# Patient Record
Sex: Female | Born: 1940 | Race: White | Hispanic: No | Smoking: Former smoker
Health system: Southern US, Community
[De-identification: ages and names within clinical notes are randomized; demographics above are authoritative.]

## PROBLEM LIST (undated history)

## (undated) DIAGNOSIS — T7840XA Allergy, unspecified, initial encounter: Secondary | ICD-10-CM

## (undated) DIAGNOSIS — F329 Major depressive disorder, single episode, unspecified: Secondary | ICD-10-CM

## (undated) DIAGNOSIS — K449 Diaphragmatic hernia without obstruction or gangrene: Secondary | ICD-10-CM

## (undated) DIAGNOSIS — I4891 Unspecified atrial fibrillation: Secondary | ICD-10-CM

## (undated) DIAGNOSIS — M1990 Unspecified osteoarthritis, unspecified site: Secondary | ICD-10-CM

## (undated) DIAGNOSIS — F32A Depression, unspecified: Secondary | ICD-10-CM

## (undated) DIAGNOSIS — J449 Chronic obstructive pulmonary disease, unspecified: Secondary | ICD-10-CM

## (undated) DIAGNOSIS — E039 Hypothyroidism, unspecified: Secondary | ICD-10-CM

## (undated) DIAGNOSIS — K3184 Gastroparesis: Secondary | ICD-10-CM

## (undated) DIAGNOSIS — R490 Dysphonia: Secondary | ICD-10-CM

## (undated) DIAGNOSIS — I10 Essential (primary) hypertension: Secondary | ICD-10-CM

## (undated) DIAGNOSIS — IMO0001 Reserved for inherently not codable concepts without codable children: Secondary | ICD-10-CM

## (undated) DIAGNOSIS — Z8719 Personal history of other diseases of the digestive system: Secondary | ICD-10-CM

## (undated) DIAGNOSIS — K219 Gastro-esophageal reflux disease without esophagitis: Secondary | ICD-10-CM

## (undated) DIAGNOSIS — K2270 Barrett's esophagus without dysplasia: Secondary | ICD-10-CM

## (undated) HISTORY — PX: CATARACT EXTRACTION: SUR2

## (undated) HISTORY — DX: Gastroparesis: K31.84

## (undated) HISTORY — DX: Barrett's esophagus without dysplasia: K22.70

## (undated) HISTORY — DX: Essential (primary) hypertension: I10

## (undated) HISTORY — PX: COLONOSCOPY: SHX174

## (undated) HISTORY — DX: Dysphonia: R49.0

## (undated) HISTORY — DX: Allergy, unspecified, initial encounter: T78.40XA

## (undated) HISTORY — DX: Hypothyroidism, unspecified: E03.9

## (undated) HISTORY — PX: KNEE SURGERY: SHX244

## (undated) HISTORY — DX: Depression, unspecified: F32.A

## (undated) HISTORY — DX: Major depressive disorder, single episode, unspecified: F32.9

## (undated) HISTORY — DX: Personal history of other diseases of the digestive system: Z87.19

## (undated) HISTORY — DX: Unspecified osteoarthritis, unspecified site: M19.90

## (undated) HISTORY — DX: Unspecified atrial fibrillation: I48.91

## (undated) HISTORY — PX: EYE SURGERY: SHX253

## (undated) HISTORY — DX: Diaphragmatic hernia without obstruction or gangrene: K44.9

## (undated) HISTORY — DX: Chronic obstructive pulmonary disease, unspecified: J44.9

## (undated) HISTORY — DX: Gastro-esophageal reflux disease without esophagitis: K21.9

## (undated) SURGERY — CARDIOVERSION
Anesthesia: Monitor Anesthesia Care

---

## 1968-12-09 HISTORY — PX: ABDOMINAL HYSTERECTOMY: SUR658

## 1998-05-11 ENCOUNTER — Ambulatory Visit (HOSPITAL_COMMUNITY): Admission: RE | Admit: 1998-05-11 | Discharge: 1998-05-11 | Payer: Self-pay | Admitting: Endocrinology

## 1998-12-09 HISTORY — PX: FACIAL COSMETIC SURGERY: SHX629

## 1999-01-30 ENCOUNTER — Other Ambulatory Visit: Admission: RE | Admit: 1999-01-30 | Discharge: 1999-01-30 | Payer: Self-pay | Admitting: Endocrinology

## 1999-06-18 ENCOUNTER — Encounter: Payer: Self-pay | Admitting: Endocrinology

## 1999-06-18 ENCOUNTER — Ambulatory Visit (HOSPITAL_COMMUNITY): Admission: RE | Admit: 1999-06-18 | Discharge: 1999-06-18 | Payer: Self-pay | Admitting: Endocrinology

## 2000-04-29 ENCOUNTER — Other Ambulatory Visit: Admission: RE | Admit: 2000-04-29 | Discharge: 2000-04-29 | Payer: Self-pay | Admitting: Endocrinology

## 2000-06-20 ENCOUNTER — Ambulatory Visit (HOSPITAL_COMMUNITY): Admission: RE | Admit: 2000-06-20 | Discharge: 2000-06-20 | Payer: Self-pay | Admitting: Endocrinology

## 2000-06-20 ENCOUNTER — Encounter: Payer: Self-pay | Admitting: Endocrinology

## 2001-06-22 ENCOUNTER — Other Ambulatory Visit: Admission: RE | Admit: 2001-06-22 | Discharge: 2001-06-22 | Payer: Self-pay | Admitting: Endocrinology

## 2001-07-06 ENCOUNTER — Ambulatory Visit (HOSPITAL_COMMUNITY): Admission: RE | Admit: 2001-07-06 | Discharge: 2001-07-06 | Payer: Self-pay | Admitting: Endocrinology

## 2001-07-06 ENCOUNTER — Encounter: Payer: Self-pay | Admitting: Endocrinology

## 2002-06-29 ENCOUNTER — Other Ambulatory Visit: Admission: RE | Admit: 2002-06-29 | Discharge: 2002-06-29 | Payer: Self-pay | Admitting: Endocrinology

## 2002-07-14 ENCOUNTER — Encounter: Payer: Self-pay | Admitting: Endocrinology

## 2002-07-14 ENCOUNTER — Ambulatory Visit (HOSPITAL_COMMUNITY): Admission: RE | Admit: 2002-07-14 | Discharge: 2002-07-14 | Payer: Self-pay | Admitting: Endocrinology

## 2003-06-28 ENCOUNTER — Other Ambulatory Visit: Admission: RE | Admit: 2003-06-28 | Discharge: 2003-06-28 | Payer: Self-pay | Admitting: Endocrinology

## 2003-07-18 ENCOUNTER — Encounter: Payer: Self-pay | Admitting: Endocrinology

## 2003-07-18 ENCOUNTER — Ambulatory Visit (HOSPITAL_COMMUNITY): Admission: RE | Admit: 2003-07-18 | Discharge: 2003-07-18 | Payer: Self-pay | Admitting: Endocrinology

## 2004-02-07 ENCOUNTER — Encounter: Admission: RE | Admit: 2004-02-07 | Discharge: 2004-02-07 | Payer: Self-pay | Admitting: Endocrinology

## 2004-08-16 ENCOUNTER — Ambulatory Visit (HOSPITAL_COMMUNITY): Admission: RE | Admit: 2004-08-16 | Discharge: 2004-08-16 | Payer: Self-pay | Admitting: Endocrinology

## 2005-08-13 ENCOUNTER — Other Ambulatory Visit: Admission: RE | Admit: 2005-08-13 | Discharge: 2005-08-13 | Payer: Self-pay | Admitting: Endocrinology

## 2005-08-19 ENCOUNTER — Ambulatory Visit (HOSPITAL_COMMUNITY): Admission: RE | Admit: 2005-08-19 | Discharge: 2005-08-19 | Payer: Self-pay | Admitting: Endocrinology

## 2005-09-20 ENCOUNTER — Ambulatory Visit (HOSPITAL_COMMUNITY): Admission: RE | Admit: 2005-09-20 | Discharge: 2005-09-20 | Payer: Self-pay

## 2005-09-23 ENCOUNTER — Encounter (INDEPENDENT_AMBULATORY_CARE_PROVIDER_SITE_OTHER): Payer: Self-pay | Admitting: *Deleted

## 2005-09-23 ENCOUNTER — Ambulatory Visit (HOSPITAL_COMMUNITY): Admission: RE | Admit: 2005-09-23 | Discharge: 2005-09-23 | Payer: Self-pay | Admitting: *Deleted

## 2005-10-28 ENCOUNTER — Ambulatory Visit (HOSPITAL_COMMUNITY): Admission: RE | Admit: 2005-10-28 | Discharge: 2005-10-28 | Payer: Self-pay | Admitting: Specialist

## 2005-11-27 ENCOUNTER — Ambulatory Visit (HOSPITAL_COMMUNITY): Admission: RE | Admit: 2005-11-27 | Discharge: 2005-11-27 | Payer: Self-pay | Admitting: Specialist

## 2006-02-06 ENCOUNTER — Encounter (INDEPENDENT_AMBULATORY_CARE_PROVIDER_SITE_OTHER): Payer: Self-pay | Admitting: Cardiology

## 2006-02-06 ENCOUNTER — Inpatient Hospital Stay (HOSPITAL_COMMUNITY): Admission: EM | Admit: 2006-02-06 | Discharge: 2006-02-09 | Payer: Self-pay | Admitting: *Deleted

## 2006-08-21 ENCOUNTER — Ambulatory Visit (HOSPITAL_COMMUNITY): Admission: RE | Admit: 2006-08-21 | Discharge: 2006-08-21 | Payer: Self-pay | Admitting: Endocrinology

## 2006-10-13 ENCOUNTER — Other Ambulatory Visit: Admission: RE | Admit: 2006-10-13 | Discharge: 2006-10-13 | Payer: Self-pay | Admitting: Endocrinology

## 2006-11-13 ENCOUNTER — Encounter: Admission: RE | Admit: 2006-11-13 | Discharge: 2006-11-13 | Payer: Self-pay | Admitting: *Deleted

## 2006-11-13 ENCOUNTER — Encounter (INDEPENDENT_AMBULATORY_CARE_PROVIDER_SITE_OTHER): Payer: Self-pay | Admitting: *Deleted

## 2006-12-08 ENCOUNTER — Encounter (INDEPENDENT_AMBULATORY_CARE_PROVIDER_SITE_OTHER): Payer: Self-pay | Admitting: Specialist

## 2006-12-08 ENCOUNTER — Ambulatory Visit (HOSPITAL_COMMUNITY): Admission: RE | Admit: 2006-12-08 | Discharge: 2006-12-08 | Payer: Self-pay | Admitting: *Deleted

## 2007-08-25 ENCOUNTER — Ambulatory Visit (HOSPITAL_COMMUNITY): Admission: RE | Admit: 2007-08-25 | Discharge: 2007-08-25 | Payer: Self-pay | Admitting: Endocrinology

## 2007-10-19 ENCOUNTER — Other Ambulatory Visit: Admission: RE | Admit: 2007-10-19 | Discharge: 2007-10-19 | Payer: Self-pay | Admitting: Endocrinology

## 2008-03-24 ENCOUNTER — Ambulatory Visit (HOSPITAL_COMMUNITY): Admission: RE | Admit: 2008-03-24 | Discharge: 2008-03-24 | Payer: Self-pay | Admitting: *Deleted

## 2008-03-24 ENCOUNTER — Encounter (INDEPENDENT_AMBULATORY_CARE_PROVIDER_SITE_OTHER): Payer: Self-pay | Admitting: *Deleted

## 2008-09-14 ENCOUNTER — Ambulatory Visit (HOSPITAL_COMMUNITY): Admission: RE | Admit: 2008-09-14 | Discharge: 2008-09-14 | Payer: Self-pay | Admitting: Endocrinology

## 2009-06-20 ENCOUNTER — Encounter (INDEPENDENT_AMBULATORY_CARE_PROVIDER_SITE_OTHER): Payer: Self-pay | Admitting: *Deleted

## 2009-06-20 ENCOUNTER — Ambulatory Visit (HOSPITAL_COMMUNITY): Admission: RE | Admit: 2009-06-20 | Discharge: 2009-06-20 | Payer: Self-pay | Admitting: *Deleted

## 2009-09-15 ENCOUNTER — Ambulatory Visit (HOSPITAL_COMMUNITY): Admission: RE | Admit: 2009-09-15 | Discharge: 2009-09-15 | Payer: Self-pay | Admitting: Endocrinology

## 2009-10-24 ENCOUNTER — Other Ambulatory Visit: Admission: RE | Admit: 2009-10-24 | Discharge: 2009-10-24 | Payer: Self-pay | Admitting: Endocrinology

## 2010-07-20 ENCOUNTER — Encounter (INDEPENDENT_AMBULATORY_CARE_PROVIDER_SITE_OTHER): Payer: Self-pay | Admitting: *Deleted

## 2010-08-30 DIAGNOSIS — Z860100 Personal history of colon polyps, unspecified: Secondary | ICD-10-CM | POA: Insufficient documentation

## 2010-08-30 DIAGNOSIS — E039 Hypothyroidism, unspecified: Secondary | ICD-10-CM | POA: Insufficient documentation

## 2010-08-30 DIAGNOSIS — F32A Depression, unspecified: Secondary | ICD-10-CM | POA: Insufficient documentation

## 2010-08-30 DIAGNOSIS — N12 Tubulo-interstitial nephritis, not specified as acute or chronic: Secondary | ICD-10-CM | POA: Insufficient documentation

## 2010-08-30 DIAGNOSIS — I959 Hypotension, unspecified: Secondary | ICD-10-CM | POA: Insufficient documentation

## 2010-08-30 DIAGNOSIS — E785 Hyperlipidemia, unspecified: Secondary | ICD-10-CM | POA: Insufficient documentation

## 2010-08-30 DIAGNOSIS — M1990 Unspecified osteoarthritis, unspecified site: Secondary | ICD-10-CM | POA: Insufficient documentation

## 2010-08-30 DIAGNOSIS — I480 Paroxysmal atrial fibrillation: Secondary | ICD-10-CM | POA: Insufficient documentation

## 2010-08-30 DIAGNOSIS — Z6841 Body Mass Index (BMI) 40.0 and over, adult: Secondary | ICD-10-CM

## 2010-08-30 DIAGNOSIS — F329 Major depressive disorder, single episode, unspecified: Secondary | ICD-10-CM

## 2010-08-30 DIAGNOSIS — K219 Gastro-esophageal reflux disease without esophagitis: Secondary | ICD-10-CM | POA: Insufficient documentation

## 2010-08-30 DIAGNOSIS — D62 Acute posthemorrhagic anemia: Secondary | ICD-10-CM | POA: Insufficient documentation

## 2010-08-30 DIAGNOSIS — K2270 Barrett's esophagus without dysplasia: Secondary | ICD-10-CM | POA: Insufficient documentation

## 2010-09-04 ENCOUNTER — Ambulatory Visit: Payer: Self-pay | Admitting: Gastroenterology

## 2010-09-04 ENCOUNTER — Encounter (INDEPENDENT_AMBULATORY_CARE_PROVIDER_SITE_OTHER): Payer: Self-pay | Admitting: *Deleted

## 2010-09-04 DIAGNOSIS — R1310 Dysphagia, unspecified: Secondary | ICD-10-CM | POA: Insufficient documentation

## 2010-09-13 ENCOUNTER — Ambulatory Visit (HOSPITAL_COMMUNITY): Admission: RE | Admit: 2010-09-13 | Discharge: 2010-09-13 | Payer: Self-pay | Admitting: Gastroenterology

## 2010-09-13 DIAGNOSIS — K3184 Gastroparesis: Secondary | ICD-10-CM | POA: Insufficient documentation

## 2010-09-17 ENCOUNTER — Ambulatory Visit (HOSPITAL_COMMUNITY): Admission: RE | Admit: 2010-09-17 | Discharge: 2010-09-17 | Payer: Self-pay | Admitting: Endocrinology

## 2010-10-10 ENCOUNTER — Ambulatory Visit: Payer: Self-pay | Admitting: Gastroenterology

## 2010-10-12 ENCOUNTER — Encounter: Payer: Self-pay | Admitting: Gastroenterology

## 2010-10-25 ENCOUNTER — Ambulatory Visit: Payer: Self-pay | Admitting: Gastroenterology

## 2011-01-08 NOTE — Letter (Signed)
Summary: Patient Notice-[facility]  [address] [place], [place] [address]   Phone: [phone]  Fax: [phone]        October 12, 2010 MRN: [id number]    CAMERA KRIENKE [address] [place], [place]  [address]    Dear Ms. Milks,  I am pleased to inform you that the biopsies taken during your recent endoscopic examination did not show any evidence of cancer upon pathologic examination.  However, your biopsies indicate you have a condition known as Barrett's esophagus. While not cancer, it is pre-cancerous (can progress to cancer) and needs to be monitored with repeat endoscopic examination and biopsies.  Fortunately, it is quite rare that this develops into cancer, but careful monitoring of the condition along with taking your medication as prescribed is important in reducing the risk of developing cancer.  It is my recommendation that you have a repeat upper gastrointestinal endoscopic examination in 3_ years.  Additional information/recommendations:  __Please call [phone] to schedule a return visit to further      evaluate your condition.  _X_Continue with treatment plan as outlined the day of your exam.  Please call us if you have or develop heartburn, reflux symptoms, any swallowing problems, or if you have questions about your condition that have not been fully answered at this time.  Sincerely,  Mardella Layman MD [facility]  This letter has been electronically signed by your physician.  Appended Document: Patient Notice-Barrett's Esopghagus letter mailed

## 2011-01-08 NOTE — Letter (Signed)
Summary: EGD Instructions  [facility]  [address] [place], [place] [address]   Phone: [phone]  Fax: [phone]       Amber Wu    04-Jul-1941    MRN: [id number]       Procedure Day Dorna Bloom: Wednesday 10/10/2010     Arrival Time: 12:30pm     Procedure Time: 1:30pm     Location of Procedure:                    [facility] (4th Floor)    PREPARATION FOR ENDOSCOPY   On 10/10/2010 THE DAY OF THE PROCEDURE:  1.   No solid foods, milk or milk products are allowed after midnight the night before your procedure.  2.   Do not drink anything colored red or purple.  Avoid juices with pulp.  No orange juice.  3.  You may drink clear liquids until 11:30am, which is 2 hours before your procedure.                                                                                                CLEAR LIQUIDS INCLUDE: Water Jello Ice Popsicles Tea (sugar ok, no milk/cream) Powdered fruit flavored drinks Coffee (sugar ok, no milk/cream) Gatorade Juice: apple, white grape, white cranberry  Lemonade Clear bullion, consomm, broth Carbonated beverages (any kind) Strained chicken noodle soup Hard Candy   MEDICATION INSTRUCTIONS  Unless otherwise instructed, you should take regular prescription medications with a small sip of water as early as possible the morning of your procedure.                OTHER INSTRUCTIONS  You will need a responsible adult at least 71 years of age to accompany you and drive you home.   This person must remain in the waiting room during your procedure.  Wear loose fitting clothing that is easily removed.  Leave jewelry and other valuables at home.  However, you may wish to bring a book to read or an iPod/MP3 player to listen to music as you wait for your procedure to start.  Remove all body piercing jewelry and leave at home.  Total time from sign-in until discharge is approximately 2-3 hours.  You should go  home directly after your procedure and rest.  You can resume normal activities the day after your procedure.  The day of your procedure you should not:   Drive   Make legal decisions   Operate machinery   Drink alcohol   Return to work  You will receive specific instructions about eating, activities and medications before you leave.    The above instructions have been reviewed and explained to me by   _______________________    I fully understand and can verbalize these instructions _____________________________ Date _________

## 2011-01-08 NOTE — Procedures (Signed)
Summary: Colonoscopy & Pathology-Dr. Virginia Rochester   Colonoscopy  Procedure date:  09/23/2005  Findings:      Location:  [facility].   NAME:  KENNEDIE, PARDOE                ACCOUNT NO.:  [id number]   MEDICAL RECORD NO.:  [id number]          PATIENT TYPE:  AMB   LOCATION:  ENDO                         FACILITY:  [facility]   PHYSICIAN:  Georgiana Spinner, M.D.    DATE OF BIRTH:  [birth year]   DATE OF PROCEDURE:  09/23/2005  DATE OF DISCHARGE:                                 OPERATIVE REPORT   PROCEDURE:  Colonoscopy.   INDICATIONS:  Rectal bleeding.   ANESTHESIA:  Demerol 40 mg, Versed 5 mg.   PROCEDURE:  With the patient mildly sedated in the left lateral decubitus  position, the Olympus videoscopic colonoscope was inserted in the rectum and  passed under direct vision to the cecum, identified by base of cecum and  ileocecal valve, both of which were photographed.  From this point, the  colonoscope was slowly withdrawn taking circumferential views of colonic  mucosa, stopping at 20 cm from the anal verge at which point a polyp was  seen, photographed, and removed using hot biopsy forceps technique setting  of 20/20 blended current.  The endoscope was then withdrawn to the rectum  which appeared normal on direct and showed hemorrhoids on retroflexed view.  The endoscope was straightened, withdrawn.  The patient's vital signs, pulse  oximeter remained stable.  The patient tolerated procedure well without  apparent complications.   FINDINGS:  1.  Polyp at 20 cm from the anal verge.  2.  Internal hemorrhoids, otherwise an unremarkable colonoscopic examination      to the cecum.   PLAN:  Await biopsy report.  The patient will call me for results and follow-  up with me as an outpatient.           ______________________________  Georgiana Spinner, M.D.     GMO/MEDQ  D:  09/23/2005  T:  09/23/2005  Job:  [id number]   SP Surgical Pathology - STATUS: Final             By: Osie Bond  ,      Perform Date: 16Oct06 00:01  Ordered By: Jarvis Newcomer,            Ordered Date: 16Oct06 10:32  Facility: [facility]                              Department: CPATH  Service Report Text  [facility]   [address] [place], [place] [address]   [phone]    REPORT OF SURGICAL PATHOLOGY    Case #: [id number]   Patient Name: Amber Wu, Amber Wu.   PID: [id number]   Pathologist: Renato Battles, M.D.   DOB/Age 10-Nov-1941 (Age: 67) Gender: F   Date Taken: 09/23/2005   Date Received: 09/23/2005    FINAL DIAGNOSIS    ***MICROSCOPIC EXAMINATION AND DIAGNOSIS***    1. DISTAL ESOPHAGUS, BIOPSY:  INTESTINAL METAPLASIA (GOBLET CELL   METAPLASIA) CONSISTENT WITH BARRETT' S ESOPHAGUS. NO DYSPLASIA   OR MALIGNANCY IDENTIFIED.    2. COLON, BIOPSY AT 20CM: HYPERPLASTIC POLYP. NO ADENOMATOUS   CHANGE OR MALIGNANCY IDENTIFIED.    COMMENT   1. An Alcian Blue stain is performed to determine the presence of   intestinal metaplasia (goblet cell metaplasia). The Alcian Blue   stain shows intestinal metaplasia (goblet cell metaplasia). The   control stained appropriately.    gdt   Date Reported: 09/24/2005 Renato Battles, M.D.   *** Electronically Signed Out By MS ***    Clinical information   (hd)    specimen(s) obtained   1: Esophagus, biopsy, distal   2: Polyp, @ 20 cm    Gross Description   1. Received in formalin are tan, soft tissue fragments that are   submitted in toto. Number: multiple   Size: 0.2 cm up to 0.4 cm    2. Received in formalin is a tan, soft tissue fragment that is   submitted in toto. Size: 0.5 cm (SHP:kcv 09-23-05)    kv/

## 2011-01-08 NOTE — Procedures (Signed)
Summary: Upper Endoscopy  Patient: Amber Wu Note: All result statuses are Final unless otherwise noted.  Tests: (1) Upper Endoscopy (EGD)   EGD Upper Endoscopy       DONE      [facility]     [address]     [place], [place]  [address]           ENDOSCOPY PROCEDURE REPORT           PATIENT:  Amber, Wu  MR#:  [id number]     BIRTHDATE:  1941/01/29, 69 yrs. old  GENDER:  female           ENDOSCOPIST:  Vania Rea. Jarold Motto, MD, [facility]     Referred by:  Adela Lank, M.D.           PROCEDURE DATE:  10/10/2010     PROCEDURE:  EGD with biopsy, [address], Maloney Dilation of Esophagus     ASA CLASS:  Class II     INDICATIONS:  h/o Barrett's Esophagus, dysphagia gastroparesis.           MEDICATIONS:   Fentanyl 75 mcg IV, Versed 10 mg IV     TOPICAL ANESTHETIC:  Exactacain Spray           DESCRIPTION OF PROCEDURE:   After the risks benefits and     alternatives of the procedure were thoroughly explained, informed     consent was obtained.  The [id number]-H180 [id number] endoscope was     introduced through the mouth and advanced to the second portion of     the duodenum, without limitations.  The instrument was slowly     withdrawn as the mucosa was fully examined.     <<PROCEDUREIMAGES>>           A hiatal hernia was found. large 7 cm hh noted.  Barrett's     esophagus was found. 5 cm. BARRETT'S SEGMENT BIOPSIED.THEN DILATED     70 F MAloney dilator.  Normal duodenal folds were noted.  The     stomach was entered and closely examined. The antrum, angularis,     and lesser curvature were well visualized, including a retroflexed     view of the cardia and fundus. The stomach wall was normally     distensable. The scope passed easily through the pylorus into the     duodenum. no retained food noted.    Retroflexed views revealed a     hiatal hernia.    The scope was then withdrawn from the patient     and the procedure completed.           COMPLICATIONS:  None        ENDOSCOPIC IMPRESSION:     1) Hiatal hernia     2) Barrett's esophagus     3) Normal duodenal folds     4) Normal stomach     5) A hiatal hernia     1. CHRONIC GERD AND PROBABLE STRICTURE DILATED     2. BARRETT'S MUCOSA.R/O DYSPLASIA.     3.DOCUMENTED GASTROPARESIS     4.LARGE HIATIAL HERNIA     RECOMMENDATIONS:     1) Anti-reflux regimen to be follow     2) Await biopsy results     3) Clear liquids until, then soft foods rest iof day. Resume     prior diet tomorrow.     4) continue PPI     DOMPERIDOME 10 AC.TID AS TOLERATED.  REPEAT EXAM:  In for EGD. BARRETT'S BX Q 2YEARS           ______________________________     Vania Rea. Jarold Motto, MD, [facility]           CC:  Adela Lank, MD           n.     Rosalie Doctor:   Vania Rea. Patterson at 10/10/2010 02:02 PM           Marcelle Overlie, [phone]  Note: An exclamation mark (!) indicates a result that was not dispersed into the flowsheet. Document Creation Date: 10/10/2010 2:03 PM _______________________________________________________________________  (1) Order result status: Final Collection or observation date-time: 10/10/2010 13:51 Requested date-time:  Receipt date-time:  Reported date-time:  Referring Physician:   Ordering Physician: Sheryn Bison [phone]) Specimen Source:  Source: Launa Grill Order Number: [id number] Lab site:   Appended Document: Upper Endoscopy     Procedures Next Due Date:    EGD: 10/2013

## 2011-01-08 NOTE — Letter (Signed)
Summary: New Patient letter  [facility]  [address] [place], [place] [address]   Phone: [phone]  Fax: [phone]       07/20/2010 MRN: [id number]  Amber Wu [address] [place], [place]  [address]  Dear Ms. Mcilrath,  Welcome to the Gastroenterology Division at [facility].    You are scheduled to see Dr.  Sheryn Bison on September 04, 2010 at 9:00am on the 3rd floor at [facility], [address]  We ask that you try to arrive at our office 15 minutes prior to your appointment time to allow for check-in.  We would like you to complete the enclosed self-administered evaluation form prior to your visit and bring it with you on the day of your appointment.  We will review it with you.  Also, please bring a complete list of all your medications or, if you prefer, bring the medication bottles and we will list them.  Please bring your insurance card so that we may make a copy of it.  If your insurance requires a referral to see a specialist, please bring your referral form from your primary care physician.  Co-payments are due at the time of your visit and may be paid by cash, check or credit card.     Your office visit will consist of a consult with your physician (includes a physical exam), any laboratory testing he/she may order, scheduling of any necessary diagnostic testing (e.g. x-ray, ultrasound, CT-scan), and scheduling of a procedure (e.g. Endoscopy, Colonoscopy) if required.  Please allow enough time on your schedule to allow for any/all of these possibilities.    If you cannot keep your appointment, please call [phone] to cancel or reschedule prior to your appointment date.  This allows [place] the opportunity to schedule an appointment for another patient in need of care.  If you do not cancel or reschedule by 5 p.m. the business day prior to your appointment date, you will be charged a $50.00 late cancellation/no-show fee.    Thank you for  choosing [facility] for your medical needs.  We appreciate the opportunity to care for you.  Please visit [place] at our website  to learn more about our practice.                     Sincerely,                                                             The Gastroenterology Division

## 2011-01-08 NOTE — Assessment & Plan Note (Signed)
Summary: needs egd previous pt of dr orr..em   History of Present Illness Visit Type: new patient Primary GI MD: Sheryn Bison MD FACP FAGA Primary Provider: Darci Needle, MD Chief Complaint: follow up Barrett's Esophagus, also having solid food dysphagia History of Present Illness:   70 year old morbidly obese white female with COPD and chronic bronchitis referred by Dr.Kohut for followup of Barrett's esophagus diagnosed by Dr. Sabino Gasser with yearly endoscopies for the last 5 years. Review of Biopsy Showed No evidence of dysplasia. She is chronically on PPI therapy and continues with regurgitation, acid reflux, and recurrent dysphagia.Apparently she was dilated endoscopically over a year ago.She Additionally has early satiety and eats 6-8 small meals a day. There's no history of bowel irregularity she had a negative partial colonoscopy by Dr. Virginia Rochester.  Her current dysphagia is for solid foods in the mid substernal area. She takes Dexilant 60 mg we'll for lunch. She uses p.r.n. inhalers and anti-histamines. Her exercise tolerance is poor and she cannot walk a city block. Apparently she had recent pulmonary function tests performed. She is a former smoker. Other problems include thyroid dysfunction, osteoarthritis, history of supraventricular tachycardia, obesity and hyperlipidemia.   GI Review of Systems    Reports acid reflux, bloating, chest pain, dysphagia with solids, heartburn, loss of appetite, nausea, and  weight gain.      Denies abdominal pain, belching, dysphagia with liquids, vomiting, vomiting blood, and  weight loss.      Reports hemorrhoids and  rectal bleeding.     Denies anal fissure, black tarry stools, change in bowel habit, constipation, diarrhea, diverticulosis, fecal incontinence, heme positive stool, irritable bowel syndrome, jaundice, light color stool, liver problems, and  rectal pain. Preventive Screening-Counseling & Management  Alcohol-Tobacco     Smoking Status:  quit      Drug Use:  no.      Current Medications (verified): 1)  Advair Diskus 100-50 Mcg/dose Aepb (Fluticasone-Salmeterol) .... Inhale One Puff Daily 2)  Synthroid 175 Mcg Tabs (Levothyroxine Sodium) .... Take 1 Tablet By Mouth Once A Day 3)  Fluoxetine Hcl 20 Mg Tabs (Fluoxetine Hcl) .... Take 1 Tablet By Mouth Once A Day 4)  Allopurinol 100 Mg Tabs (Allopurinol) .... Take 2 Tablets By Mouth Once Daily 5)  Dexilant 60 Mg Cpdr (Dexlansoprazole) .... Take 1 Capsule By Mouth Once A Day 6)  Twynsta 80-5 Mg Tabs (Telmisartan-Amlodipine) .... Take 1 Tablet By Mouth Once A Day 7)  Metoprolol Succinate 25 Mg Xr24h-Tab (Metoprolol Succinate) .... Take 1 Tablet By Mouth Once A Day 8)  Crestor 10 Mg Tabs (Rosuvastatin Calcium) .... Take 1 Tablet By Mouth Once A Day  Allergies (verified): 1)  ! * Antihistamines  Past History:  Past medical, surgical, family and social histories (including risk factors) reviewed for relevance to current acute and chronic problems.  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 Family History of Stomach Cancer: 1st cousin Family History of Clotting disorder: Sister Family History of Colon Polyps: Sister  Social History: Reviewed history from 08/30/2010 and no changes required. Widowed, 1 boy retired Alcohol Use - no Patient is a former smoker.  Daily Caffeine Use 1 cup/day Illicit Drug Use - no Smoking Status:  quit  Drug Use:  no  Review of Systems       The  patient complains of allergy/sinus, arthritis/joint pain, back pain, cough, fatigue, muscle pains/cramps, night sweats, shortness of breath, and swelling of feet/legs.  The patient denies anemia, anxiety-new, blood in urine, breast changes/lumps, confusion, coughing up blood, depression-new, fainting, fever, headaches-new, hearing problems, heart murmur, heart rhythm changes, itching, menstrual pain, nosebleeds, pregnancy symptoms, skin rash, sleeping problems, sore throat, swollen lymph glands, thirst - excessive, urination - excessive, urination changes/pain, urine leakage, vision changes, and voice change.    Vital Signs:  Patient profile:   71 year old female Height:      66 inches Weight:      279 pounds BMI:     45.19 Pulse rate:   64 / minute Pulse rhythm:   regular BP sitting:   110 / 76  (left arm) Cuff size:   large  Vitals Entered By: Francee Piccolo CMA Duncan Dull) (September 04, 2010 8:51 AM)  Physical Exam  General:  Well developed, well nourished, no acute distress.obese.   Head:  Normocephalic and atraumatic. Eyes:  PERRLA, no icterus. Neck:  Supple; no masses or thyromegaly. Lungs:  Clear throughout to auscultation.decreased BS on L and decreased BS on R.   Heart:  Regular rate and rhythm; no murmurs, rubs,  or bruits. Abdomen:  Mild hepatomegaly with prominent right lobe of the liver which is nonnodular and nontender. There is no definite splenomegaly, abdominal masses or tenderness. Exam of the abdomen is somewhat difficult because of her marked obesity. Extremities:  No clubbing, cyanosis, edema or deformities noted. Neurologic:  Alert and  oriented x4;  grossly normal neurologically. Cervical Nodes:  No significant cervical adenopathy. Psych:  Alert and cooperative. Normal mood and affect.   Impression & Recommendations:  Problem # 1:  GERD (ICD-530.81) Assessment Improved Continue daily PPI therapy along with standard anti-reflux maneuvers. It appears that she  probably has an element of delayed gastric emptying, have ordered a gastric emptying scan, and we'll consider domperidone at bedtime. She needs followup endoscopy and dilation per her swallowing difficulties. 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 Her endoscopic followup for her Barrett's has been rather aggressive with yearly endoscopies for reasons which are unclear. If there is no dysplasia, she does not need endoscopic exam more then every 3-5 years. Repeat biopsies will be in pain at the time of her upcoming exam. Orders: EGD SAV (EGD SAV) Gastric Emptying Scan (GES)  Problem # 3:  COPD (ICD-496) Assessment: Unchanged Rather Marked chronic bronchitis with COPD. She probably also has an element of sleep apnea associated with her breathing difficulties, related to her marked obesity.  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 She does not appear to be a good candidate for bariatric surgery.  Patient Instructions: 1)  Copy sent to : Adela Lank, MD 2)  Your Gastric Emptying Scan has been scheduled for 09/13/2010 at [facility], Please follow the seperate instruction. 3)  Your procedure has been scheduled for 10/10/2010, please follow the seperate instructions.  4)  [facility] Patient Information Guide given to patient.  5)  Upper Endoscopy brochure given.  6)  The medication list was reviewed and reconciled.  All changed / newly prescribed medications were explained.  A  complete medication list was provided to the patient / caregiver. 7)  Upper Endoscopy with Dilatation brochure given.

## 2011-01-08 NOTE — Procedures (Signed)
Summary: Endoscopy & Pathology-Dr. Virginia Rochester   EGD  Procedure date:  06/20/2009  Findings:      Location: [facility]   NAME:  Amber Wu, Amber Wu                ACCOUNT NO.:  [id number]      MEDICAL RECORD NO.:  [id number]          PATIENT TYPE:  AMB      LOCATION:  ENDO                         FACILITY:  [facility]      PHYSICIAN:  Georgiana Spinner, M.D.    DATE OF BIRTH:  01-13-1941      DATE OF PROCEDURE:   DATE OF DISCHARGE:                                  OPERATIVE REPORT      PROCEDURE:  Upper endoscopy.      INDICATIONS:  GERD with Barrett's esophagus.      ANESTHESIA:  Fentanyl 100 mcg, Versed 8 mg.      PROCEDURE:  With the patient mildly sedated in the left lateral   decubitus position, the Pentax videoscopic endoscope was inserted in the   mouth and passed under direct vision through the esophagus which   appeared normal until we reached the distal esophagus and there was   changes of extensive Barrett's, photographed and multiple biopsies   taken.  We entered into the stomach.  Fundus, body, antrum, duodenal   bulb, second portion of the duodenum were visualized.  From this point,   the endoscope was slowly withdrawn taking circumferential views of the   duodenal mucosa until the endoscope had been pulled back into the   stomach and placed in retroflexion to view the stomach from below.  The   endoscope was straightened and withdrawn, taking circumferential views   of the remaining gastric and esophageal mucosa.  The patient's vital   signs and pulse oximeter remained stable.  The patient tolerated the   procedure well without apparent complication.      FINDINGS:  Loose wrap of the GE junction around the endoscope indicating   laxity of the lower esophageal sphincter.  We could view up the   esophagus in retroflexed view with extensive Barrett's esophagus.      PLAN:  Await biopsy report.  The patient will call me for results and   follow-up with me as an outpatient.   Proceed to colonoscopy.                  ______________________________   Georgiana Spinner, M.D.            GMO/MEDQ  D:  06/20/2009  T:  06/20/2009  Job:  [id number]    SP-Surgical Pathology - STATUS: Final  .                                         Perform Date: 13Jul10 00:01  Ordered By: Jarvis Newcomer,            Ordered Date: 13Jul10 10:47  Facility: [facility]  Department: [facility] Report Text  [address] [place], [place] [address]   [id number]    REPORT OF SURGICAL PATHOLOGY    Case #: [id number]   Patient Name: Amber Wu, Amber Wu.   Office Chart Number: N/A    MRN: [id number]   Pathologist: Beulah Gandy. Luisa Hart, MD   DOB/Age [birth year] (Age: 20) Gender: F   Date Taken: 06/20/2009   Date Received: 06/20/2009    FINAL DIAGNOSIS    ***MICROSCOPIC EXAMINATION AND DIAGNOSIS***    ESOPHAGUS: INTESTINAL METAPLASIA (GOBLET CELL METAPLASIA)   CONSISTENT WITH BARRETT' S ESOPHAGUS. NO DYSPLASIA OR MALIGNANCY   IDENTIFIED.    COMMENT   An Alcian Blue stain is performed to determine the presence of   intestinal metaplasia (goblet cell metaplasia). The Alcian Blue   stain shows intestinal metaplasia (goblet cell metaplasia). The   control stained appropriately.    kv   Date Reported: 06/21/2009 Beulah Gandy. Luisa Hart, MD   *** Electronically Signed Out By JDP ***    Clinical information   History Barrett' s esophagus (tc)    specimen(s) obtained   Esophagus, biopsy, distal    Gross Description   Received in formalin are twelve 0.15 to 0.3 cm fragments of soft   whitish gray to red tan tissue. Block summary: toto one   cassette. (JBM:gt, 06/20/09)    gdt/    ESOBX-Esophagus, biopsy, distal   Received in formalin are twelve 0.15 to 0.3 cm fragments of soft   whitish gray to red tan tissue. Block summary: toto one   cassette. (JBM:gt, 06/20/09)   ESOPHAGUS: INTESTINAL METAPLASIA (GOBLET CELL METAPLASIA)   CONSISTENT WITH BARRETT' S ESOPHAGUS.  NO DYSPLASIA OR MALIGNANCY   IDENTIFIED.  Additional Information  [id number] RESULT STATUS : F  External IF Update Timestamp : 2009-06-20:10:47:00.000000

## 2011-01-08 NOTE — Miscellaneous (Signed)
Summary: Domperidone RX  Patient sent RX per Dr Jarold Motto after Endoscopy today.  Clinical Lists Changes  Medications: Added new medication of * DOMPERIDONE 10MG  take one by mouth before meals and at bedtime. - Signed Rx of DOMPERIDONE 10MG  take one by mouth before meals and at bedtime.;  #120 x 11;  Signed;  Entered by: Harlow Mares CMA (AAMA);  Authorized by: Mardella Layman MD [facility];  Method used: Faxed to [facility] [address] [place], [place]  [address], Ph: [phone], Fax: [phone]    Prescriptions: DOMPERIDONE 10MG  take one by mouth before meals and at bedtime.  #120 x 11   Entered by:   Harlow Mares CMA (AAMA)   Authorized by:   Mardella Layman MD [facility]   Signed by:   Harlow Mares CMA (AAMA) on 10/10/2010   Method used:   Faxed to ...       [facility]* (retail)       [address]       [place], [place]  [address]       Ph: [phone]       Fax: [phone]   RxID:   [id number]

## 2011-01-08 NOTE — Procedures (Signed)
Summary: Flexible Sigmoidoscopy   Flexible Sigmoidoscopy  Procedure date:  06/20/2009  Comments:      Location: [facility].  NAME:  Amber Wu, Amber Wu                ACCOUNT NO.:  [id number]      MEDICAL RECORD NO.:  [id number]          PATIENT TYPE:  AMB      LOCATION:  ENDO                         FACILITY:  [facility]      PHYSICIAN:  Georgiana Spinner, M.D.    DATE OF BIRTH:  Mar 29, 1941      DATE OF PROCEDURE:   DATE OF DISCHARGE:                                  OPERATIVE REPORT      PROCEDURE:  Flexible sigmoidoscopy.      INDICATIONS:  Colon cancer screening.      ANESTHESIA:  Fentanyl 25 mcg, Versed 4 mg.      PROCEDURE:  With the patient mildly sedated in the left lateral   decubitus position, the Pentax videoscopic colonoscope was inserted into   the rectum and passed under direct vision to the sigmoid colon, and the   patient was uncomfortable at that point, so I elected to withdraw the   colonoscope and insert the Pentax videoscopic pediatric colonoscope,   which caused the same discomfort, and since the patient's O2 saturations   were a little bit towards the low end of normal, I elected at this point   to terminate the procedure and suggested she have it done in the future   under propofol anesthesia.  There was a tight turn in the sigmoid colon   but one I could still easily maneuver, but from this point the   colonoscope was then slowly withdrawn, taking circumferential views of   the colonic mucosa, stopping in the rectum, which appeared normal on   direct and showed hemorrhoids on retroflexed view.  The endoscope was   straightened and withdrawn.  The patient's vital signs, pulse oximeter   remained stable.  The patient tolerated procedure well without apparent   complication.      FINDINGS:  Hemorrhoids, otherwise an unremarkable examination.      PLAN:  Discussed the possible use of propofol for repeat colonoscopy   sometime later in the year.                 ______________________________   Georgiana Spinner, M.D.            GMO/MEDQ  D:  06/20/2009  T:  06/20/2009  Job:  [id number]

## 2011-01-08 NOTE — Assessment & Plan Note (Signed)
Summary: 2WK PROCEDURE F-UP/YF   History of Present Illness Visit Type: Follow-up Visit Primary GI MD: Sheryn Bison MD FACP FAGA Primary Aryav Wimberly: Darci Needle, MD Chief Complaint: Esophageal problems have subsided, patient wants to discuss possible hernia surgery History of Present Illness:   currently asymptomatic on daily Dexilant 60 mg. The patient admits that she needs surgical repair of a" double hernia". She has occasional vague right lower quadrant discomfort but otherwise is having normal bowel movements. She has gastroparesis proven by gastric emptying scan which showed 59% retention at 2 hours. She did not obtain her domperidone as requested. She currently denies dysphagia.   GI Review of Systems    Reports abdominal pain.     Location of  Abdominal pain: RLQ.    Denies acid reflux, belching, bloating, chest pain, dysphagia with liquids, dysphagia with solids, heartburn, loss of appetite, nausea, vomiting, vomiting blood, weight loss, and  weight gain.        Denies anal fissure, black tarry stools, change in bowel habit, constipation, diarrhea, diverticulosis, fecal incontinence, heme positive stool, hemorrhoids, irritable bowel syndrome, jaundice, light color stool, liver problems, rectal bleeding, and  rectal pain.    Current Medications (verified): 1)  Advair Diskus 100-50 Mcg/dose Aepb (Fluticasone-Salmeterol) .... Inhale One Puff Daily 2)  Synthroid 175 Mcg Tabs (Levothyroxine Sodium) .... Take 1 Tablet By Mouth Once A Day 3)  Fluoxetine Hcl 20 Mg Tabs (Fluoxetine Hcl) .... Take 1 Tablet By Mouth Once A Day 4)  Allopurinol 100 Mg Tabs (Allopurinol) .... Take 2 Tablets By Mouth Once Daily 5)  Dexilant 60 Mg Cpdr (Dexlansoprazole) .... Take 1 Capsule By Mouth Once A Day 6)  Twynsta 80-5 Mg Tabs (Telmisartan-Amlodipine) .... Take 1 Tablet By Mouth Once A Day 7)  Metoprolol Succinate 25 Mg Xr24h-Tab (Metoprolol Succinate) .... Take 1 Tablet By Mouth Once A Day 8)  Crestor  10 Mg Tabs (Rosuvastatin Calcium) .... Take 1 Tablet By Mouth Once A Day 9)  Domperidone 10mg  .... Take One By Mouth Before Meals and At Bedtime.  Allergies (verified): 1)  ! * Antihistamines  Past History:  Past medical, surgical, family and social histories (including risk factors) reviewed for relevance to current acute and chronic problems.  Past Medical History: Reviewed history from 09/04/2010 and no changes required. 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 Family History of Stomach Cancer: 1st cousin Family History of Clotting disorder: Sister Family History of Colon Polyps: Sister  Social History: Reviewed history from 09/04/2010 and no changes required. Widowed, 1 boy retired Alcohol Use - no Patient is a former smoker.  Daily Caffeine Use 1 cup/day Illicit Drug Use - no  Review of Systems       The patient complains of allergy/sinus, arthritis/joint pain, back pain, fatigue, muscle pains/cramps, and shortness of breath.  The patient denies anemia, anxiety-new, blood in urine, breast changes/lumps, change in vision, confusion, cough, coughing up blood, depression-new, fainting, fever, headaches-new, hearing problems, heart murmur, heart rhythm changes, itching, menstrual pain, night sweats, nosebleeds, pregnancy symptoms, skin rash, sleeping problems, sore throat, swelling of feet/legs, swollen lymph glands, thirst - excessive , urination - excessive , urination  changes/pain, urine leakage, vision changes, and voice change.  Vital Signs:  Patient profile:   70 year old female Height:      66 inches Weight:      280.50 pounds BMI:     45.44 Pulse rate:   64 / minute Pulse rhythm:   regular BP sitting:   114 / 70  (left arm) Cuff size:   large  Vitals Entered By: June McMurray CMA Duncan Dull) (October 25, 2010 10:24 AM)  Physical Exam  General:  Massively obese white female in no acute distresshealthy appearing.  healthy appearing.   Head:  Normocephalic and atraumatic. Eyes:  PERRLA, no icterus.exam deferred to patient's ophthalmologist.  exam deferred to patient's ophthalmologist.   Abdomen:  Massively obese abdomen makes exam difficult but I cannot appreciate any definite organomegaly, masses, tenderness, or evidence of herniation. Bowel sounds are normal. Psych:  Alert and cooperative. Normal mood and affect.agitated.  agitated.     Impression & Recommendations:  Problem # 1:  GASTROPARESIS (ICD-536.3) Assessment Unchanged I advised that she take domperidone 10 mg at bedtime for gastroparesis. A 90 day supply costs $46. She is not a surgical candidate for her hiatal hernia because of her gastroparesis, and her acid reflux seems to be well controlled with daily PPI therapy. She does have Barrett's mucosa, but no evidence of dysplasia. Every three-year endoscopy with surveillance biopsies should be adequate followup. I in detail reviewed her x-ray results and endoscopic results, and she still is convinced that she has a" double hernia" and probably will seek surgical consultation. I've asked to followup with Dr.Kohut who is her primary care physician.  Problem # 2:  OBESITY (ICD-278.00) Assessment: Deteriorated All or her symptoms would be greatly improved by weight loss which I doubt she will achieved. Again, she is not a candidate for bariatric surgery.  Problem # 3:  BARRETTS ESOPHAGUS (ICD-530.85) Assessment: Unchanged every three-year  endoscopy and surveillance biopsies.  Patient Instructions: 1)  Copy sent to : Darci Needle, MD 2)  Try to take your Domperidone at night only if that does not help go back to four times a day. 3)  The medication list was reviewed and reconciled.  All changed / newly prescribed medications were explained.  A complete medication list was provided to the patient / caregiver.  Appended Document: 2WK PROCEDURE F-UP/YF I ecplained in detail the price of Domperidone and that she can decrease the amount she takes a day to see if she has some relief on once a day Domperidone. She states that she had the pharm shred her rx and she does not want me to send her another rx she is not taking it. I have advised Dr. Jarold Motto and he would like her to follow up with Dr. Juleen China if she has problems.

## 2011-04-23 NOTE — Op Note (Signed)
Amber Wu, Amber Wu                ACCOUNT NO.:  [id number]   MEDICAL RECORD NO.:  [id number]          PATIENT TYPE:  AMB   LOCATION:  ENDO                         [facility]ACILITY:  [facility]   PHYSICIAN:  Georgiana Spinner, M.D.    DATE OF BIRTH:  1941-07-04   DATE OF PROCEDURE:  DATE OF DISCHARGE:                               OPERATIVE REPORT   PROCEDURE:  Flexible sigmoidoscopy.   INDICATIONS:  Colon cancer screening.   ANESTHESIA:  Fentanyl 25 mcg, Versed 4 mg.   PROCEDURE:  With the patient mildly sedated in the left lateral  decubitus position, the Pentax videoscopic colonoscope was inserted into  the rectum and passed under direct vision to the sigmoid colon, and the  patient was uncomfortable at that point, so I elected to withdraw the  colonoscope and insert the Pentax videoscopic pediatric colonoscope,  which caused the same discomfort, and since the patient's O2 saturations  were a little bit towards the low end of normal, I elected at this point  to terminate the procedure and suggested she have it done in the future  under propofol anesthesia.  There was a tight turn in the sigmoid colon  but one I could still easily maneuver, but from this point the  colonoscope was then slowly withdrawn, taking circumferential views of  the colonic mucosa, stopping in the rectum, which appeared normal on  direct and showed hemorrhoids on retroflexed view.  The endoscope was  straightened and withdrawn.  The patient's vital signs, pulse oximeter  remained stable.  The patient tolerated procedure well without apparent  complication.   FINDINGS:  Hemorrhoids, otherwise an unremarkable examination.   PLAN:  Discussed the possible use of propofol for repeat colonoscopy  sometime later in the year.           ______________________________  Georgiana Spinner, M.D.     GMO/MEDQ  D:  06/20/2009  T:  06/20/2009  Job:  [id number]

## 2011-04-23 NOTE — Op Note (Signed)
NAMESHAWNEEN, DEETZ                ACCOUNT NO.:  [id number]   MEDICAL RECORD NO.:  [id number]          PATIENT TYPE:  AMB   LOCATION:  ENDO                         [facility]ACILITY:  [facility]   PHYSICIAN:  Georgiana Spinner, M.D.    DATE OF BIRTH:  1941-03-11   DATE OF PROCEDURE:  03/24/2008  DATE OF DISCHARGE:                               OPERATIVE REPORT   PROCEDURE:  Upper endoscopy with biopsy.   ENDOSCOPIST:  Georgiana Spinner, M.D.   INDICATIONS:  Barrett's esophagus.   ANESTHESIA:  Fentanyl 75 mcg, Versed 5 mg.   PROCEDURE:  With the patient mildly sedated in the left lateral  decubitus position, the Pentax videoscopic endoscope was inserted in the  mouth and passed under direct vision through the esophagus, which  appeared normal, until we reached the distal esophagus and extensive  Barrett's esophagus was seen, photographed and multiple biopsies taken.  We then entered into the stomach; fundus, body, antrum, duodenal bulb  and second portion of duodenum were visualized.  From this point, the  endoscope was slowly withdrawn, taking circumferential views of duodenal  mucosa until the endoscope had been pulled back into stomach and placed  in retroflexion to view the stomach from below.  The endoscope was  straightened and withdrawn, taking circumferential views of the  remaining gastric and esophageal mucosa.  The patient's vital signs and  pulse oximetry remained stable.  The patient tolerated the procedure  well without apparent complications.   FINDINGS:  Extensive Barrett's esophagus, biopsied, await biopsy report.  The patient will call me for results and follow up with me as needed as  an outpatient.           ______________________________  Georgiana Spinner, M.D.     GMO/MEDQ  D:  03/24/2008  T:  03/24/2008  Job:  [id number]

## 2011-04-23 NOTE — Op Note (Signed)
Amber Wu, Amber Wu                ACCOUNT NO.:  [id number]   MEDICAL RECORD NO.:  [id number]          PATIENT TYPE:  AMB   LOCATION:  ENDO                         FACILITY:  [facility]   PHYSICIAN:  Georgiana Spinner, M.D.    DATE OF BIRTH:  02-07-1941   DATE OF PROCEDURE:  DATE OF DISCHARGE:                               OPERATIVE REPORT   PROCEDURE:  Upper endoscopy.   INDICATIONS:  GERD with Barrett's esophagus.   ANESTHESIA:  Fentanyl 100 mcg, Versed 8 mg.   PROCEDURE:  With the patient mildly sedated in the left lateral  decubitus position, the Pentax videoscopic endoscope was inserted in the  mouth and passed under direct vision through the esophagus which  appeared normal until we reached the distal esophagus and there was  changes of extensive Barrett's, photographed and multiple biopsies  taken.  We entered into the stomach.  Fundus, body, antrum, duodenal  bulb, second portion of the duodenum were visualized.  From this point,  the endoscope was slowly withdrawn taking circumferential views of the  duodenal mucosa until the endoscope had been pulled back into the  stomach and placed in retroflexion to view the stomach from below.  The  endoscope was straightened and withdrawn, taking circumferential views  of the remaining gastric and esophageal mucosa.  The patient's vital  signs and pulse oximeter remained stable.  The patient tolerated the  procedure well without apparent complication.   FINDINGS:  Loose wrap of the GE junction around the endoscope indicating  laxity of the lower esophageal sphincter.  We could view up the  esophagus in retroflexed view with extensive Barrett's esophagus.   PLAN:  Await biopsy report.  The patient will call me for results and  follow-up with me as an outpatient.  Proceed to colonoscopy.           ______________________________  Georgiana Spinner, M.D.     GMO/MEDQ  D:  06/20/2009  T:  06/20/2009  Job:  [id number]

## 2011-04-26 NOTE — Op Note (Signed)
Amber Wu, Amber Wu                ACCOUNT NO.:  [id number]   MEDICAL RECORD NO.:  [id number]          PATIENT TYPE:  AMB   LOCATION:  ENDO                         FACILITY:  [facility]   PHYSICIAN:  Georgiana Spinner, M.D.    DATE OF BIRTH:  [birth year]   DATE OF PROCEDURE:  09/23/2005  DATE OF DISCHARGE:                                 OPERATIVE REPORT   PROCEDURE:  Colonoscopy.   INDICATIONS:  Rectal bleeding.   ANESTHESIA:  Demerol 40 mg, Versed 5 mg.   PROCEDURE:  With the patient mildly sedated in the left lateral decubitus  position, the Olympus videoscopic colonoscope was inserted in the rectum and  passed under direct vision to the cecum, identified by base of cecum and  ileocecal valve, both of which were photographed.  From this point, the  colonoscope was slowly withdrawn taking circumferential views of colonic  mucosa, stopping at 20 cm from the anal verge at which point a polyp was  seen, photographed, and removed using hot biopsy forceps technique setting  of 20/20 blended current.  The endoscope was then withdrawn to the rectum  which appeared normal on direct and showed hemorrhoids on retroflexed view.  The endoscope was straightened, withdrawn.  The patient's vital signs, pulse  oximeter remained stable.  The patient tolerated procedure well without  apparent complications.   FINDINGS:  1.  Polyp at 20 cm from the anal verge.  2.  Internal hemorrhoids, otherwise an unremarkable colonoscopic examination      to the cecum.   PLAN:  Await biopsy report.  The patient will call me for results and follow-  up with me as an outpatient.           ______________________________  Georgiana Spinner, M.D.     GMO/MEDQ  D:  09/23/2005  T:  09/23/2005  Job:  [id number]

## 2011-04-26 NOTE — H&P (Signed)
Amber Wu, Amber Wu                ACCOUNT NO.:  [id number]   MEDICAL RECORD NO.:  [id number]          PATIENT TYPE:  INP   LOCATION:  0103                         FACILITY:  [facility]   PHYSICIAN:  Mobolaji B. Bakare, M.D.DATE OF BIRTH:  June 02, 1941   DATE OF ADMISSION:  02/05/2006  DATE OF DISCHARGE:                                HISTORY & PHYSICAL   PRIMARY CARE PHYSICIAN:  Brooke Bonito, M.D.   CHIEF COMPLAINT:  Abnormal labs with elevated BUN and creatinine.   HISTORY OF PRESENTING COMPLAINT:  Ms. Cacioppo is a 70 year old Caucasian  female who was in her usual state of health until about 1 week ago when she  started having nausea, vomiting, diarrhea.  She used antihistamine,  Vistaril.  According to patient, she gets UTI after using antihistamine.  Nausea, vomiting, diarrhea have since stopped.  Four days ago she developed  fever, chills, and back pain.  There is no dysuria, urgency or increased  frequency or micturition.  She went to Dr. Marylen Ponto office on Monday and was  started on Bactrim.  She had some blood work done today and the office  called and she had abnormal labs and she was sent to the emergency room.  BUN and creatinine were 66/4.6.  Potassium is 4.6.  Creatinine in 12/06 was  1.1.  She has not noticed any significant change in her urinary output.  Urinalysis done showed positive leukocyte esterase, and white cells 21-50  with a few bacteria.  She has a normal white cell and blood count.  The  patient was seen in the emergency room and given IV fluid.  Initial blood  pressure was 97/56.  She was orthostatic with rising pulse rate. She was  initially scheduled for discharge home but patient subsequently dropped her  blood pressure further to 86/50, and became tachycardic, 138 beats per  minute, hence [facility] was called for admission. there is no EKG  available for review.   REVIEW OF SYSTEMS:  She has had no chest pain, shortness of breath.  She has  been having  tremors since 3 days ago, particularly with activities.  No  abdominal pain, nausea or vomiting at this time.   PAST MEDICAL HISTORY:  1.  Hypothyroidism.  2.  Gastroesophageal reflux disease.  3.  Depression.  4.  Hypertension.  5.  Hypercholesterolemia.  6.  History of Paroxysmal atrial fibrillation diagnosed in November 2006   CURRENT MEDICATIONS:  1.  Prozac 20 mg daily.  2.  Sulfa.  3.  Prilosec 40 mg daily.  4.  Micardis/hydrochlorothiazide 40/12.5, one daily.  5.  Vistaril p.r.n.  6.  Crestor 10 mg daily.  7.  Toprol-XL 25 mg daily.   ALLERGIES:  Intolerant to antihistamines.   FAMILY HISTORY:  The patient is a widow.  She has a 19 year old son. There  is no family history of diabetes, CAD, or stroke.  Clinical depression runs  in the family.   SOCIAL HISTORY:  She does not smoke cigarettes.  Very rare drinks alcohol.   PHYSICAL EXAMINATION:  INITIAL VITALS:  Temperature 97.7, blood  pressure  97/56, heart rate of 73, respiratory rate of 22, O2 saturation of 99%.  GENERAL:  On examination the patient is obese, not in respiratory distress.  HEENT:  Normocephalic, atraumatic head.  No elevated JVD.  No carotid bruit.  No thyromegaly.  Mucous membranes dry.  No oral thrush.  LUNGS:  Clear clinically to auscultation.  CARDIOVASCULAR:  S1, S2, regular, no murmur, no gallop, no rub.  ABDOMEN:  Obese, soft, nontender, no palpable organomegaly.  BACK:  There is mild left costovertebral angle tenderness.  EXTREMITIES:  1+ pitting pedal edema bilaterally.  No cyanosis.  CNS:  No focal neurological deficit.  SKIN:  No rash, no petechiae.   INITIAL LABORATORY DATA:  White cells 0.6, hemoglobin 12.2, hematocrit 29.3,  MCV 84.1, platelets 257.  Neutrophils 77%, lymphocytes 13%.  Sodium 134,  potassium 4.6, chloride 103, CO2 22, glucose 96, BUN 66, creatinine 4.6.  Bilirubin 1.1, normal liver enzymes.  Creatinine kinase 70.  Urinalysis:  Cloudy in appearance, positive leukocytes  large, protein is 100, specific  gravity 1.021.  Microscopy showed white blood cells 21-50 with hyaline casts  and granular casts.   ASSESSMENT AND PLAN:  Ms. Giovannetti is a 70 year old Caucasian female  presenting with recent history of gastroenteritis which has resolved.  She  subsequently developed UTI and was treated as an outpatient with Bactrim.  She is noted to have acute renal failure, most likely prerenal .  This is  also compounded by Micardis/hydrochlorothiazide and Bactrim.  She had a  normal BUN and creatinine in 12/06.   ADMISSION DIAGNOSES:  1.  Pyelonephritis.  2.  Sepsis secondary to #1.  3.  Sinus tachycardia most likely secondary to dehydration and sepsis.      Obtain an EKG.  4.  Hypotension.  5.  Acute renal failure most likely prerenal.  6.  Dehydration.  7.  Tremors.  Will check TSH and hold Synthroid until TSH is available.  8.  Depression.  9.  Hypothyroidism.  10. Gastroesophageal reflux disease.  11. Hyperlipidemia.  12. Morbid obesity.   PLAN:  1.  Will admit to telemetry.  2.  Continue IV fluid normal saline at 250 mL/h.  3.  Check ultrasound of the kidneys and fractional excretion of sodium.  4.  Urine culture.  5.  Blood culture.  6.  Check TSH.  7.  Will continue Rocephin 1 g IV q.24 h. until urine culture is available.  8.  Will hold Micardis/hydrochlorothiazide.  9.  Discontinue Bactrim.  W  10. Will hold Toprol-XL and Crestor for now.  11. Resume Prozac.      Mobolaji B. Corky Downs, M.D.  Electronically Signed     MBB/MEDQ  D:  02/06/2006  T:  02/06/2006  Job:  [id number]

## 2011-04-26 NOTE — Discharge Summary (Signed)
Amber Wu, Amber Wu                ACCOUNT NO.:  [id number]   MEDICAL RECORD NO.:  [id number]          PATIENT TYPE:  INP   LOCATION:  1416                         [facility]ACILITY:  [facility]   PHYSICIAN:  Elliot Cousin, M.D.    DATE OF BIRTH:  07-11-1941   DATE OF ADMISSION:  02/05/2006  DATE OF DISCHARGE:  02/09/2006                                 DISCHARGE SUMMARY   DISCHARGE DIAGNOSES:  1.  Pyelonephritis  2.  Hypotension secondary to sepsis versus volume depletion.  3.  Recent gastroenteritis (resolved prior to admission).  4.  Acute renal failure with an admission BUN of 66 and a creatinine of 4.6.  5.  Paroxysmal supraventricular tachycardia versus paroxysmal atrial      fibrillation.  6.  Normocytic anemia.   SECONDARY DISCHARGE DIAGNOSES:  1.  History of paroxysmal atrial fibrillation versus paroxysmal      supraventricular tachycardia diagnosed in November2006.  2.  History of hypothyroidism.  3.  Gastroesophageal reflux disease, hiatal hernia, Barrett's esophagitis      per EGD October2006 by Dr. Virginia Rochester.  4.  Colon polyps and internal hemorrhoids per colonoscopy in October2006 per      Dr. Virginia Rochester. The pathology report revealed a hyperplastic polyp with no      malignancy identified.  5.  Hypertension.  6.  Hyperlipidemia.  7.  Depression.  8.  Obesity.  9.  Degenerative joint disease.   DISCHARGE MEDICATIONS:  1.  Levaquin 500 mg daily for an additional 7 days.  2.  Toprol XL 25 mg daily.  3.  Crestor 10 mg daily.  4.  Prilosec 40 mg daily.  5.  Prozac 20 mg daily.  6.  Vistaril as previously ordered and directed and as needed.  7.  Do not take Micardis/hydrochlorothiazide until you see Dr. Juleen China in the      office. Repeat BMET is recommended.   DISCHARGE DISPOSITION:  The patient was discharged to home in improved and  stable condition on March4,2007. She was advised to follow up with Dr. Juleen China  in 5-7 days. A follow-up BMET is recommended.   HISTORY OF PRESENT ILLNESS:   The patient is a 70 year old lady with a past  medical history significant for gastroesophageal reflux disease, Barrett's  esophagitis, and hypertension, who presented to the emergency department on  February28,2007 when her primary care physician Dr. Juleen China informed her of  abnormal blood work. The patient experienced symptoms consistent with an  acute gastroenteritis approximately 1 week prior to hospital admission. She  subsequently developed fever, chills and low back pain. She presented to her  doctor's office for treatment. Bloodwork was ordered and the patient was  started empirically on Bactrim. The patient was called by Dr. Marylen Ponto office  and was told that some of her blood work was significantly abnormal. She was  advised to go to the emergency department for further evaluation and  management. When the patient was evaluated in the emergency department, her  BUN was noted to be elevated at 66 and her creatinine was elevated at 4.6.  Her creatinine in December2006 was 1.1.  It was also noted that her  urinalysis was positive for 21-50 white blood cells, bacteria, and leukocyte  esterase. In addition, her blood pressure was initially 97/56 however, it  fell to 86/50 and the patient became temporarily tachycardiac with a heart  rate of 138.  The patient was therefore admitted for further evaluation and  management.   HOSPITAL COURSE:  #1.  PYELONEPHRITIS/HYPOTENSION. The patient's urinalysis  was indicative of an urinary tract infection. However, given the patient's  GI symptoms and low back pain, it was felt that she probably had a  pyelonephritis. The patient was started on aggressive IV fluids and  antibiotic therapy with Rocephin intravenously. An urine culture was  ordered, however, the urine that was acquired in the emergency department  was not cultured. Another urine specimen was sent on March 1, several days  after antibiotic treatment had been started. The urine culture on  March 1  grew out only lactobacillus species. Over the course of the hospitalization,  the patient became symptomatically improved. Her urine output was well  within normal limits. On admission, a Foley catheter was inserted, however,  at the time of hospital discharge it was discontinued. The patient was able  to urinate without any difficulties following the discontinuation of the  Foley catheter. Her blood pressures improved quickly on IV fluids. The IV  fluids were eventually tapered off. Her white blood cell count remained  within normal limits during the hospital course. The patient also remained  afebrile. She completed 4 days of Rocephin therapy. She was discharged to  home on 7 more days of antibiotic therapy with Levaquin 500 mg daily. The  patient had no complaints of painful urination at the time of hospital  discharge.   #2.  ACUTE RENAL FAILURE.  As stated above, the patient's BUN was elevated  at 66 and her creatinine was elevated at 4.6 on admission. Following volume  repletion, her BUN and creatinine improved progressively. At the time of  hospital discharge, her BUN was 18 and her creatinine was 1.5. The acute  renal failure was more than likely secondary to prerenal azotemia from  volume depletion and/or possibly from an element of ATN and  pyelonephritis.  The patient was advised to not take Micardis/hydrochlorothiazide until her  BUN and creatinine were reevaluated by Dr. Juleen China in the office in 5-7 days.   #3.  HYPERTENSION. As stated above, the patient was relatively hypotensive  in the emergency department. She was bolused with IV fluids and then started  on volume repletion with normal saline initially at 250 mL an hour. Her  blood pressures improved briskly. The IV fluids were tapered off and the  patient was restarted on Toprol XL 25 mg daily. The  Micardis/hydrochlorothiazide is currently on hold pending a repeat BMET evaluation in 5-7 days.   #4.  PAROXYSMAL  SUPRAVENTRICULAR TACHYCARDIA VERSUS PAROXYSMAL ATRIAL  FIBRILLATION. The patient experienced asymptomatic tachycardia during the  hospital course. While the patient was in the emergency department, she  became tachycardiac with a heart rate of 155. EKG read atrial fibrillation  with a rapid ventricular response; however on my review, it appeared to be  atrial flutter. The tachycardia only lasted a minute or two without any  intervention. A follow-up EKG x2 revealed normal sinus rhythm without any  abnormalities. Per the patient's account, she was recently evaluated by  cardiologist Dr. Aleen Campi for a history of palpitations. She apparently wore  a Holter monitor for 24-48 hours. She said  that she was told that there were  no abnormalities. Nevertheless,  Dr. Aleen Campi prescribed Toprol XL. The  palpitations have now resolved. Perhaps the paroxysmal supraventricular  tachycardia versus atrial fibrillation that occurred during the hospital  course was secondary to the metabolic derangements and infection. Further  evaluation and monitoring will be deferred to the patient's primary  cardiologist, Dr. Aleen Campi. Of note, the patient's TSH was assessed and  found to be slightly low at 0.253. However, her free T4 was within normal  limits at 1.10.   #5.  NORMOCYTIC ANEMIA. The patient was found to be anemic during the  hospital course with a hemoglobin ranging from 9.1-10.2. Iron studies were  ordered and revealed a total iron of 23, vitamin B12 of 451, and ferritin of  170. On review of the patient's medical records, it was noted that the  patient underwent an EGD and colonoscopy in 2006. The EGD revealed Barrett's  esophagitis and the colonoscopy revealed internal hemorrhoids and polyps.  The patient is being treated appropriately with a proton pump inhibitor. She  was also advised to add a multivitamin with iron each day.   DISCHARGE LABORATORY DATA:  WBC 6.5, hemoglobin 9.1, hematocrit 26.5,  MCV  84, platelets 351. Sodium 141, potassium 4.5, chloride 115, CO2 22, glucose  121, BUN 18, creatinine 1.5, calcium 7.8.      Elliot Cousin, M.D.  Electronically Signed     DF/MEDQ  D:  02/10/2006  T:  02/11/2006  Job:  [id number]   cc:   Brooke Bonito, M.D.  Fax: [phone]   Antionette Char, MD  Fax: [phone]

## 2011-04-26 NOTE — Op Note (Signed)
NAMEMCKENZY, SALAZAR                ACCOUNT NO.:  [id number]   MEDICAL RECORD NO.:  [id number]          PATIENT TYPE:  AMB   LOCATION:  DAY                          FACILITY:  [facility]   PHYSICIAN:  Jene Every, M.D.    DATE OF BIRTH:  01/21/1941   DATE OF PROCEDURE:  11/27/2005  DATE OF DISCHARGE:                                 OPERATIVE REPORT   PREOPERATIVE DIAGNOSIS:  Medial meniscus tear, degenerative joint disease,  right knee.   POSTOPERATIVE DIAGNOSIS:  Medial meniscus tear, degenerative joint disease,  right knee.   PROCEDURE PERFORMED:  Right knee arthroscopy and posterior medial  meniscectomy, chondroplasty, medial femoral condyle, tibial plateau,  patella.  Shaving of degenerative tears of lateral meniscus.   ANESTHESIA:  General.   BRIEF HISTORY/INDICATIONS:  This is a 70 year old with right knee pain,  locking and giving way.  MRI indicated medial meniscus tear and degenerative  changes.  Operative intervention was indicated for partial resection of  medial meniscus, for mechanical symptoms and debridement of the knee.  Risks  and benefits were discussed, including bleeding, infection, damage to  vascular structures, no change in symptoms, worsening symptoms, need for  repeat debridement in the future or total knee arthroplasty, etc.   TECHNIQUE:  Patient was placed in the supine position.  After the induction  of adequate anesthesia and 1 gm of Kefzol, the right knee and lower  extremity was prepped and draped in the usual sterile fashion.  Using a  lateral parapatellar portal and superior medial parapatellar portal, it was  fashioned with a #11 blade.  The ingress cannula atraumatically placed.  Irrigant was utilized to insufflate the joint.  A camera was then inserted  laterally and under direct visualization, the medial parapatellar portal was  fashioned with a #11 blade after localization with an 18 gauge needle,  sparing the medial meniscus.  Noted was a  complex tear of the posterior mid  portion of the medial meniscus and significant chondral degenerative changes  in the medial compartment.  I introduced the basket rongeur and utilized it  to perform a posterior medial meniscectomy to a stable base.  The inner two-  thirds of the meniscus was resected from the posterior horn to the mid  portion of the meniscus.  The residual was stable to probe palpation.  Chondroplasty was performed in the medial femoral condyle and of the tibial  plateau.  Loose cartilaginous bodies were evacuated.  Extensive grade 3  changes of the patella was noted, and this was debrided as well.  There were  extensive grade 3 changes of the femoral sulcus, and this was debrided, and  a chondroplasty was performed.  The lateral compartment was relatively  spared.  Degenerative tears of the lateral meniscus.  The shaver was  introduced, utilizing to perform a partial lateral meniscectomy to a stable  base.  The chondral surface was unremarkable.  __________, the lateral  meniscus was stable to probe palpation.  The ACL was attenuated but not  torn.  The gutters were unremarkable.  I copiously lavaged the knee and re-  examined all compartments.  I probed the medial meniscus.  No unstable  fragment.  The knee was copiously lavaged.  All instrumentation was removed.  Marcaine 0.25% with  epinephrine was infiltrated into the joint.  The wound was dressed  sterilely.  Awakened without difficulty and transported to the recovery room  in stable condition.   Patient tolerated the procedure well with no complications.  Jene Every, M.D.  Electronically Signed     JB/MEDQ  D:  11/27/2005  T:  11/28/2005  Job:  [id number]

## 2011-04-26 NOTE — Op Note (Signed)
Amber Wu, Amber Wu                ACCOUNT NO.:  [id number]   MEDICAL RECORD NO.:  [id number]          PATIENT TYPE:  AMB   LOCATION:  ENDO                         FACILITY:  [facility]   PHYSICIAN:  Georgiana Spinner, M.D.    DATE OF BIRTH:  [birth year]   DATE OF PROCEDURE:  12/08/2006  DATE OF DISCHARGE:                               OPERATIVE REPORT   PROCEDURE:  Upper endoscopy with biopsy and dilation.   INDICATIONS:  Dysphagia.   ANESTHESIA:  Fentanyl 75 mcg, Versed 7.5 mg.   PROCEDURE:  With the patient mildly sedated in the left lateral  decubitus position, the Pentax videoscopic endoscope was inserted in the  mouth passed under direct vision through the esophagus which showed  changes of Barrett's esophagus distally.  This was photographed.  We  entered into the stomach.  Fundus, body, antrum, duodenal bulb, second  portion duodenum were visualized.  From this point the endoscope was  slowly withdrawn taking circumferential views of duodenal mucosa until  the endoscope had been pulled back into the stomach placed in  retroflexion to view the stomach from below.  The endoscope was then  straightened and withdrawn after passing a guidewire under fluoroscopic  control to the antrum.  Over the dilator was passed a 17 Savary dilator  fairly easily under fluoroscopic control, the endoscope was then  reinserted after we withdrew the dilator and the guidewire.  Biopsies  were taken of the distal esophagus.  The endoscope was withdrawn.  The  patient's vital signs, pulse oximeter remained stable.  The patient  tolerated procedure well without apparent complications.   FINDINGS:  Barrett's esophagus clearly seen.   PLAN:  Await biopsy report.  The patient also was noted to have bile  reflux and may need to treat the patient for bile reflux if the symptoms  persist despite dilation.           ______________________________  Georgiana Spinner, M.D.     GMO/MEDQ  D:  12/08/2006  T:   12/08/2006  Job:  [id number]

## 2011-04-26 NOTE — Op Note (Signed)
NAMEERMINE, STEBBINS                ACCOUNT NO.:  [id number]   MEDICAL RECORD NO.:  [id number]          PATIENT TYPE:  AMB   LOCATION:  ENDO                         FACILITY:  [facility]   PHYSICIAN:  Georgiana Spinner, M.D.    DATE OF BIRTH:  07/14/1941   DATE OF PROCEDURE:  DATE OF DISCHARGE:                                 OPERATIVE REPORT   PROCEDURE:  Upper endoscopy with biopsy.   INDICATIONS:  Gastroesophageal reflux disease.   ANESTHESIA:  Demerol 60 Versed 6 mg   DESCRIPTION OF PROCEDURE:  With patient mildly sedated in the left lateral  decubitus position, the Olympus videoscopic endoscope was inserted into the  mouth, passed under direct vision through the esophagus, which appeared  normal; until we reached the distal esophagus and there was extensive  Barrett's esophagus seen, photographed, and biopsied in all quadrants and  multiple biopsies taken. We then entered into the stomach; the fundus, body,  antrum, duodenal bulb, second portion duodenum were visualized.  From this  point the endoscope was slowly withdrawn taking circumferential views of the  duodenal mucosa until the endoscope had been pulled back; and the stomach  placed in retroflexion to view the stomach from below.  The endoscope was  straightened and withdrawn taking circumferential views of the remaining  gastric and esophageal mucosa. The patient's vital signs and pulse oximeter  remained stable. The patient tolerated the procedure well without apparent  complications.   FINDINGS:  Hiatal hernia with Barrett's esophagus fairly extensively seen  above this.   PLAN:  Await biopsy report. The patient will call me for results and follow-  up with me as an outpatient. Proceed to colonoscopy as planned.           ______________________________  Georgiana Spinner, M.D.     GMO/MEDQ  D:  09/23/2005  T:  09/23/2005  Job:  [id number]

## 2011-06-14 ENCOUNTER — Encounter (INDEPENDENT_AMBULATORY_CARE_PROVIDER_SITE_OTHER): Payer: Self-pay | Admitting: General Surgery

## 2011-06-14 ENCOUNTER — Ambulatory Visit (INDEPENDENT_AMBULATORY_CARE_PROVIDER_SITE_OTHER): Payer: Medicare Other | Admitting: General Surgery

## 2011-06-14 VITALS — BP 138/78 | HR 84 | Ht 66.0 in | Wt 284.0 lb

## 2011-06-14 DIAGNOSIS — R2230 Localized swelling, mass and lump, unspecified upper limb: Secondary | ICD-10-CM

## 2011-06-14 DIAGNOSIS — R229 Localized swelling, mass and lump, unspecified: Secondary | ICD-10-CM

## 2011-06-14 NOTE — Progress Notes (Signed)
Subjective:     Patient ID: Amber Wu, female   DOB: [birth year], 70 y.o.   MRN: [id number]    BP 138/78  Pulse 84  Ht 5\' 6"  (1.676 m)  Wt 284 lb (128.822 kg)  BMI 45.84 kg/m2    HPI This is a 70 year old female with multiple medical problems. She presents with a month long history of what she terms his bilateral upper extremity masses. She states that these areas of increased in size over some time. She also states these areas had begun to the sore is well. She was then referred for evaluation.  Past Medical History  Diagnosis Date  . Allergy   . Hypothyroidism   . COPD (chronic obstructive pulmonary disease)   . Chronic kidney disease   . GERD (gastroesophageal reflux disease)   . Renal insufficiency   . Barrett's esophagus     Past Surgical History  Procedure Date  . Facial cosmetic surgery 2000  . Abdominal hysterectomy 1970  . Abdominal hysterectomy     Current Outpatient Prescriptions  Medication Sig Dispense Refill  . allopurinol (ZYLOPRIM) 100 MG tablet Take 100 mg by mouth daily.        Marland Kitchen aspirin 81 MG tablet Take 81 mg by mouth daily.        . colchicine 0.6 MG tablet Take 0.6 mg by mouth daily.        Marland Kitchen desvenlafaxine (PRISTIQ) 50 MG 24 hr tablet Take 50 mg by mouth daily.        Marland Kitchen dexlansoprazole (DEXILANT) 60 MG capsule Take 60 mg by mouth daily.        . fluticasone-salmeterol (ADVAIR HFA) 115-21 MCG/ACT inhaler Inhale 2 puffs into the lungs 2 (two) times daily.        Marland Kitchen HYDROcodone-acetaminophen (NORCO) 5-325 MG per tablet Take 1 tablet by mouth every 6 (six) hours as needed.        Marland Kitchen levothyroxine (SYNTHROID, LEVOTHROID) 175 MCG tablet Take 175 mcg by mouth daily.        . metoprolol succinate (TOPROL-XL) 25 MG 24 hr tablet Take 25 mg by mouth daily.        . rosuvastatin (CRESTOR) 10 MG tablet Take 10 mg by mouth daily.        . Telmisartan-Amlodipine (TWYNSTA) 80-5 MG TABS Take by mouth.          Allergies  Allergen Reactions  . Antihistamines,  Chlorpheniramine-Type Other (See Comments)     Review of Systems     Objective:   Physical Exam bilateral excess skin and fatty tissue at medial aspect of upper extremity above olecranon, she does not have a discrete mass present     Assessment:     Bilateral excess tissue    Plan:     I discussed with the patient think these are both benign I think it is mostly some excess soft tissue in those locations and there is no real role for excising each right now due for any sort of pain control issues. I asked her to come back and see me as needed.

## 2011-08-22 ENCOUNTER — Other Ambulatory Visit (HOSPITAL_COMMUNITY): Payer: Self-pay | Admitting: Endocrinology

## 2011-08-22 DIAGNOSIS — Z1231 Encounter for screening mammogram for malignant neoplasm of breast: Secondary | ICD-10-CM

## 2011-09-20 ENCOUNTER — Ambulatory Visit (HOSPITAL_COMMUNITY)
Admission: RE | Admit: 2011-09-20 | Discharge: 2011-09-20 | Disposition: A | Discharge status: Home / Self Care | Payer: Medicare Other | Source: Ambulatory Visit | Attending: Endocrinology | Admitting: Endocrinology

## 2011-09-20 DIAGNOSIS — Z1231 Encounter for screening mammogram for malignant neoplasm of breast: Secondary | ICD-10-CM

## 2011-11-05 ENCOUNTER — Other Ambulatory Visit (HOSPITAL_COMMUNITY)
Admission: RE | Admit: 2011-11-05 | Discharge: 2011-11-05 | Disposition: A | Discharge status: Home / Self Care | Payer: Medicare Other | Source: Ambulatory Visit | Attending: Endocrinology | Admitting: Endocrinology

## 2011-11-05 ENCOUNTER — Other Ambulatory Visit: Payer: Self-pay | Admitting: Endocrinology

## 2011-11-05 DIAGNOSIS — Z124 Encounter for screening for malignant neoplasm of cervix: Secondary | ICD-10-CM | POA: Insufficient documentation

## 2012-04-28 DIAGNOSIS — R350 Frequency of micturition: Secondary | ICD-10-CM | POA: Diagnosis not present

## 2012-04-28 DIAGNOSIS — E789 Disorder of lipoprotein metabolism, unspecified: Secondary | ICD-10-CM | POA: Diagnosis not present

## 2012-05-05 DIAGNOSIS — M109 Gout, unspecified: Secondary | ICD-10-CM | POA: Diagnosis not present

## 2012-05-05 DIAGNOSIS — I10 Essential (primary) hypertension: Secondary | ICD-10-CM | POA: Diagnosis not present

## 2012-05-05 DIAGNOSIS — E789 Disorder of lipoprotein metabolism, unspecified: Secondary | ICD-10-CM | POA: Diagnosis not present

## 2012-07-29 DIAGNOSIS — E789 Disorder of lipoprotein metabolism, unspecified: Secondary | ICD-10-CM | POA: Diagnosis not present

## 2012-08-05 DIAGNOSIS — E789 Disorder of lipoprotein metabolism, unspecified: Secondary | ICD-10-CM | POA: Diagnosis not present

## 2012-08-05 DIAGNOSIS — I4891 Unspecified atrial fibrillation: Secondary | ICD-10-CM | POA: Diagnosis not present

## 2012-08-05 DIAGNOSIS — E0789 Other specified disorders of thyroid: Secondary | ICD-10-CM | POA: Diagnosis not present

## 2012-08-05 DIAGNOSIS — E875 Hyperkalemia: Secondary | ICD-10-CM | POA: Diagnosis not present

## 2012-08-07 DIAGNOSIS — R0602 Shortness of breath: Secondary | ICD-10-CM | POA: Diagnosis not present

## 2012-08-07 DIAGNOSIS — I10 Essential (primary) hypertension: Secondary | ICD-10-CM | POA: Diagnosis not present

## 2012-08-07 DIAGNOSIS — I4891 Unspecified atrial fibrillation: Secondary | ICD-10-CM | POA: Diagnosis not present

## 2012-08-12 DIAGNOSIS — I4891 Unspecified atrial fibrillation: Secondary | ICD-10-CM | POA: Diagnosis not present

## 2012-08-12 DIAGNOSIS — R0602 Shortness of breath: Secondary | ICD-10-CM | POA: Diagnosis not present

## 2012-08-13 ENCOUNTER — Other Ambulatory Visit (HOSPITAL_COMMUNITY): Payer: Self-pay | Admitting: Endocrinology

## 2012-08-13 DIAGNOSIS — Z1231 Encounter for screening mammogram for malignant neoplasm of breast: Secondary | ICD-10-CM

## 2012-09-07 ENCOUNTER — Other Ambulatory Visit: Payer: Self-pay | Admitting: Internal Medicine

## 2012-09-07 ENCOUNTER — Ambulatory Visit
Admission: RE | Admit: 2012-09-07 | Discharge: 2012-09-07 | Disposition: A | Discharge status: Home / Self Care | Payer: Medicare Other | Source: Ambulatory Visit | Attending: Internal Medicine | Admitting: Internal Medicine

## 2012-09-07 DIAGNOSIS — R0989 Other specified symptoms and signs involving the circulatory and respiratory systems: Secondary | ICD-10-CM | POA: Diagnosis not present

## 2012-09-07 DIAGNOSIS — Z01811 Encounter for preprocedural respiratory examination: Secondary | ICD-10-CM

## 2012-09-07 DIAGNOSIS — R0609 Other forms of dyspnea: Secondary | ICD-10-CM | POA: Diagnosis not present

## 2012-09-07 DIAGNOSIS — D689 Coagulation defect, unspecified: Secondary | ICD-10-CM | POA: Diagnosis not present

## 2012-09-07 DIAGNOSIS — R5381 Other malaise: Secondary | ICD-10-CM | POA: Diagnosis not present

## 2012-09-07 DIAGNOSIS — Z01810 Encounter for preprocedural cardiovascular examination: Secondary | ICD-10-CM | POA: Diagnosis not present

## 2012-09-07 DIAGNOSIS — Z79899 Other long term (current) drug therapy: Secondary | ICD-10-CM | POA: Diagnosis not present

## 2012-09-07 DIAGNOSIS — I10 Essential (primary) hypertension: Secondary | ICD-10-CM | POA: Diagnosis not present

## 2012-09-07 DIAGNOSIS — I4891 Unspecified atrial fibrillation: Secondary | ICD-10-CM | POA: Diagnosis not present

## 2012-09-07 DIAGNOSIS — Z01818 Encounter for other preprocedural examination: Secondary | ICD-10-CM | POA: Diagnosis not present

## 2012-09-08 ENCOUNTER — Other Ambulatory Visit: Payer: Self-pay | Admitting: Internal Medicine

## 2012-09-09 DIAGNOSIS — I10 Essential (primary) hypertension: Secondary | ICD-10-CM | POA: Diagnosis not present

## 2012-09-09 DIAGNOSIS — E789 Disorder of lipoprotein metabolism, unspecified: Secondary | ICD-10-CM | POA: Diagnosis not present

## 2012-09-09 DIAGNOSIS — E0789 Other specified disorders of thyroid: Secondary | ICD-10-CM | POA: Diagnosis not present

## 2012-09-09 DIAGNOSIS — I4891 Unspecified atrial fibrillation: Secondary | ICD-10-CM | POA: Diagnosis not present

## 2012-09-09 MED ORDER — SODIUM CHLORIDE 0.9 % IJ SOLN: 3.0000 mL | INTRAMUSCULAR | Status: AC | PRN

## 2012-09-14 ENCOUNTER — Encounter (HOSPITAL_COMMUNITY): Payer: Self-pay | Admitting: Internal Medicine

## 2012-09-14 ENCOUNTER — Encounter (HOSPITAL_COMMUNITY): Payer: Self-pay | Admitting: Anesthesiology

## 2012-09-14 ENCOUNTER — Ambulatory Visit (HOSPITAL_COMMUNITY): Payer: Medicare Other | Admitting: Anesthesiology

## 2012-09-14 ENCOUNTER — Ambulatory Visit (HOSPITAL_COMMUNITY)
Admission: RE | Admit: 2012-09-14 | Discharge: 2012-09-14 | Disposition: A | Discharge status: Home / Self Care | Payer: Medicare Other | Source: Ambulatory Visit | Attending: Internal Medicine | Admitting: Internal Medicine

## 2012-09-14 ENCOUNTER — Encounter (HOSPITAL_COMMUNITY)
Admission: RE | Disposition: A | Discharge status: Home / Self Care | Payer: Self-pay | Source: Ambulatory Visit | Attending: Internal Medicine

## 2012-09-14 DIAGNOSIS — I4891 Unspecified atrial fibrillation: Secondary | ICD-10-CM | POA: Diagnosis not present

## 2012-09-14 HISTORY — PX: CARDIOVERSION: SHX1299

## 2012-09-14 SURGERY — CARDIOVERSION
Anesthesia: Monitor Anesthesia Care

## 2012-09-14 MED ORDER — PROPOFOL 10 MG/ML IV BOLUS
INTRAVENOUS | Status: DC | PRN
  Administered 2012-09-14: 30 mg via INTRAVENOUS
  Administered 2012-09-14: 60 mg via INTRAVENOUS

## 2012-09-14 MED ORDER — SODIUM CHLORIDE 0.9 % IV SOLN
INTRAVENOUS | Status: DC
  Administered 2012-09-14: 500 mL via INTRAVENOUS

## 2012-09-14 MED ORDER — SODIUM CHLORIDE 0.9 % IV SOLN
INTRAVENOUS | Status: DC | PRN
  Administered 2012-09-14: 09:00:00 via INTRAVENOUS

## 2012-09-14 NOTE — H&P (Signed)
     [facility]          INTERVAL PROCEDURE H&P   History and Physical Interval Note:  09/14/2012 8:49 AM  Amber Wu has presented today for their planned procedure. The various methods of treatment have been discussed with the patient and family. After consideration of risks, benefits and other options for treatment, the patient has consented to the procedure.  The patients' outpatient history has been reviewed, patient examined, and no change in status from most recent office note within the past 30 days. I have reviewed the patients' chart and labs and will proceed as planned. Questions were answered to the patient's satisfaction.   Chrystie Nose, MD, [facility] Attending Cardiologist The [facility] Center  Johnathan Heskett C 09/14/2012, 8:49 AM

## 2012-09-14 NOTE — Anesthesia Preprocedure Evaluation (Signed)
Anesthesia Evaluation  Patient identified by MRN, date of birth, ID band Patient awake    Reviewed: Allergy & Precautions, H&P , NPO status , Patient's Chart, lab work & pertinent test results, reviewed documented beta blocker date and time   History of Anesthesia Complications Negative for: history of anesthetic complications  Airway Mallampati: II TM Distance: >3 FB Neck ROM: Full    Dental No notable dental hx. (+) Teeth Intact, Caps and Dental Advisory Given   Pulmonary COPDformer smoker,  breath sounds clear to auscultation  Pulmonary exam normal       Cardiovascular hypertension, Pt. on medications and Pt. on home beta blockers + dysrhythmias (EF 55%, L atrial enlargement) Atrial Fibrillation Rhythm:Irregular Rate:Normal     Neuro/Psych negative neurological ROS     GI/Hepatic GERD-  Medicated and Controlled,  Endo/Other  Hypothyroidism Morbid obesity  Renal/GU Renal InsufficiencyRenal disease     Musculoskeletal   Abdominal (+) + obese,   Peds  Hematology   Anesthesia Other Findings   Reproductive/Obstetrics                           Anesthesia Physical Anesthesia Plan  ASA: III  Anesthesia Plan: General   Post-op Pain Management:    Induction: Intravenous  Airway Management Planned: Mask  Additional Equipment:   Intra-op Plan:   Post-operative Plan:   Informed Consent: I have reviewed the patients History and Physical, chart, labs and discussed the procedure including the risks, benefits and alternatives for the proposed anesthesia with the patient or authorized representative who has indicated his/her understanding and acceptance.   Dental advisory given  Plan Discussed with: CRNA and Surgeon  Anesthesia Plan Comments: (Plan routine monitors, GA for cardioversion )        Anesthesia Quick Evaluation

## 2012-09-14 NOTE — CV Procedure (Signed)
[facility]  CARDIOVERSION NOTE   Procedure: Electrical Cardioversion Indications:  Atrial Fibrillation  Procedure Details:  Consent: Risks of procedure as well as the alternatives and risks of each were explained to the (patient/caregiver).  Consent for procedure obtained.  Time Out: Verified patient identification, verified procedure, site/side was marked, verified correct patient position, special equipment/implants available, medications/allergies/relevent history reviewed, required imaging and test results available.  Performed  Patient placed on cardiac monitor, pulse oximetry, supplemental oxygen as necessary.  Sedation given: propofol per anesthesia Pacer pads placed anterior and posterior chest.  Cardioverted 2 time(s).  Cardioverted at 200J biphasic.  Evaluation: Findings: Post procedure EKG shows: Atrial Fibrillation Complications: None Patient did tolerate procedure well.  IMPRESSION: Unsuccessful cardioversion. Will consider antiarrythmic therapy versus rate control strategy. Continue Pradaxa 75 mg BID.  Time Spent Directly with the Patient:  30 minutes   Chrystie Nose, MD, [facility] Attending Cardiologist The [facility]  Dwanna Goshert C 09/14/2012, 9:14 AM

## 2012-09-14 NOTE — Transfer of Care (Signed)
Immediate Anesthesia Transfer of Care Note  Patient: Amber Wu  Procedure(s) Performed: Procedure(s) (LRB) with comments: CARDIOVERSION (N/A)  Patient Location: Short Stay  Anesthesia Type: MAC  Level of Consciousness: awake, alert , oriented and patient cooperative  Airway & Oxygen Therapy: Patient Spontanous Breathing and Patient connected to nasal cannula oxygen  Post-op Assessment: Report given to PACU RN, Post -op Vital signs reviewed and stable and Patient moving all extremities X 4  Post vital signs: Reviewed and stable  Complications: No apparent anesthesia complications

## 2012-09-14 NOTE — Anesthesia Postprocedure Evaluation (Signed)
  Anesthesia Post-op Note  Patient: Amber Wu  Procedure(s) Performed: Procedure(s) (LRB) with comments: CARDIOVERSION (N/A)  Patient Location: Endoscopy Unit  Anesthesia Type: MAC  Level of Consciousness: awake, alert , oriented and patient cooperative  Airway and Oxygen Therapy: Patient Spontanous Breathing and Patient connected to nasal cannula oxygen  Post-op Pain: none  Post-op Assessment: Post-op Vital signs reviewed, Patient's Cardiovascular Status Stable, Respiratory Function Stable, Patent Airway, No signs of Nausea or vomiting, Adequate PO intake and Pain level controlled  Post-op Vital Signs: Reviewed and stable  Complications: No apparent anesthesia complications

## 2012-09-15 ENCOUNTER — Encounter (HOSPITAL_COMMUNITY): Payer: Self-pay | Admitting: Internal Medicine

## 2012-09-21 ENCOUNTER — Ambulatory Visit (HOSPITAL_COMMUNITY)
Admission: RE | Admit: 2012-09-21 | Discharge: 2012-09-21 | Disposition: A | Discharge status: Home / Self Care | Payer: Medicare Other | Source: Ambulatory Visit | Attending: Endocrinology | Admitting: Endocrinology

## 2012-09-21 DIAGNOSIS — Z1231 Encounter for screening mammogram for malignant neoplasm of breast: Secondary | ICD-10-CM | POA: Insufficient documentation

## 2012-10-30 DIAGNOSIS — I10 Essential (primary) hypertension: Secondary | ICD-10-CM | POA: Diagnosis not present

## 2012-10-30 DIAGNOSIS — I4891 Unspecified atrial fibrillation: Secondary | ICD-10-CM | POA: Diagnosis not present

## 2012-12-08 DIAGNOSIS — E789 Disorder of lipoprotein metabolism, unspecified: Secondary | ICD-10-CM | POA: Diagnosis not present

## 2012-12-08 DIAGNOSIS — E0789 Other specified disorders of thyroid: Secondary | ICD-10-CM | POA: Diagnosis not present

## 2012-12-23 DIAGNOSIS — E0789 Other specified disorders of thyroid: Secondary | ICD-10-CM | POA: Diagnosis not present

## 2012-12-23 DIAGNOSIS — I4891 Unspecified atrial fibrillation: Secondary | ICD-10-CM | POA: Diagnosis not present

## 2012-12-23 DIAGNOSIS — E789 Disorder of lipoprotein metabolism, unspecified: Secondary | ICD-10-CM | POA: Diagnosis not present

## 2013-03-16 DIAGNOSIS — E0789 Other specified disorders of thyroid: Secondary | ICD-10-CM | POA: Diagnosis not present

## 2013-03-16 DIAGNOSIS — E789 Disorder of lipoprotein metabolism, unspecified: Secondary | ICD-10-CM | POA: Diagnosis not present

## 2013-03-16 DIAGNOSIS — Z0000 Encounter for general adult medical examination without abnormal findings: Secondary | ICD-10-CM | POA: Diagnosis not present

## 2013-03-16 DIAGNOSIS — Z79899 Other long term (current) drug therapy: Secondary | ICD-10-CM | POA: Diagnosis not present

## 2013-03-23 ENCOUNTER — Other Ambulatory Visit: Payer: Self-pay | Admitting: Endocrinology

## 2013-03-23 ENCOUNTER — Other Ambulatory Visit (HOSPITAL_COMMUNITY)
Admission: RE | Admit: 2013-03-23 | Discharge: 2013-03-23 | Disposition: A | Discharge status: Home / Self Care | Payer: Medicare Other | Source: Ambulatory Visit | Attending: Endocrinology | Admitting: Endocrinology

## 2013-03-23 DIAGNOSIS — I4891 Unspecified atrial fibrillation: Secondary | ICD-10-CM | POA: Diagnosis not present

## 2013-03-23 DIAGNOSIS — Z124 Encounter for screening for malignant neoplasm of cervix: Secondary | ICD-10-CM | POA: Insufficient documentation

## 2013-03-23 DIAGNOSIS — I10 Essential (primary) hypertension: Secondary | ICD-10-CM | POA: Diagnosis not present

## 2013-03-23 DIAGNOSIS — E789 Disorder of lipoprotein metabolism, unspecified: Secondary | ICD-10-CM | POA: Diagnosis not present

## 2013-03-23 DIAGNOSIS — E0789 Other specified disorders of thyroid: Secondary | ICD-10-CM | POA: Diagnosis not present

## 2013-04-07 DIAGNOSIS — H52229 Regular astigmatism, unspecified eye: Secondary | ICD-10-CM | POA: Diagnosis not present

## 2013-04-07 DIAGNOSIS — H2510 Age-related nuclear cataract, unspecified eye: Secondary | ICD-10-CM | POA: Diagnosis not present

## 2013-04-07 DIAGNOSIS — H5210 Myopia, unspecified eye: Secondary | ICD-10-CM | POA: Diagnosis not present

## 2013-04-07 DIAGNOSIS — H524 Presbyopia: Secondary | ICD-10-CM | POA: Diagnosis not present

## 2013-05-12 ENCOUNTER — Encounter: Payer: Self-pay | Admitting: Internal Medicine

## 2013-05-12 ENCOUNTER — Ambulatory Visit (INDEPENDENT_AMBULATORY_CARE_PROVIDER_SITE_OTHER): Payer: Medicare Other | Admitting: Internal Medicine

## 2013-05-12 VITALS — BP 126/60 | HR 97 | Ht 66.0 in | Wt 282.6 lb

## 2013-05-12 DIAGNOSIS — I4891 Unspecified atrial fibrillation: Secondary | ICD-10-CM

## 2013-05-12 DIAGNOSIS — E669 Obesity, unspecified: Secondary | ICD-10-CM | POA: Diagnosis not present

## 2013-05-12 DIAGNOSIS — E785 Hyperlipidemia, unspecified: Secondary | ICD-10-CM | POA: Diagnosis not present

## 2013-05-12 DIAGNOSIS — I471 Supraventricular tachycardia: Secondary | ICD-10-CM | POA: Diagnosis not present

## 2013-05-12 DIAGNOSIS — M1990 Unspecified osteoarthritis, unspecified site: Secondary | ICD-10-CM

## 2013-05-12 NOTE — Patient Instructions (Addendum)
.    Your physician recommends that you schedule a follow-up appointment in 6 months - 1 year  Your physician recommends that you continue on your current medications as directed. Please refer to the Current Medication list given to you today.

## 2013-05-13 ENCOUNTER — Encounter: Payer: Self-pay | Admitting: Internal Medicine

## 2013-05-13 NOTE — Progress Notes (Signed)
OFFICE NOTE  Chief Complaint:  Routine followup  Primary Care Physician: Michiel Sites, MD  HPI:  Amber Wu is a 72 year old female who has a history of COPD, morbid obesity, gout, hypertension and multiple other problems, including esophageal stricture and numerous dilatations. She was found to be in A-fib and you appropriately started her on Pradaxa as well as a beta blocker. 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 She says her handwriting is now more readable. She seems to be tolerating the Pradaxa without any adverse bleeding complications. She has been on that for now 1 month and therefore should be at low risk of having an intracardiac thrombus. Unfortunately, her heart remains in A-fib with a better controlled ventricular response at 99. She is only mildly aware of this A-fib. We discussed pros and cons of A-fib. However, given the fact that this is fairly new onset, I think she deserves an opportunity to try to get back to sinus rhythm. We therefore will pursue an elective DC cardioversion in the next week or so. I will go ahead and keep you informed of the progress of that. Finally, we did an echocardiogram. Her LV systolic function and diastolic function appear to be preserved; however, the left atrium is moderately dilated with a volume of 34 mL/meter squared and otherwise no significant valvular disease. After her last visit she underwent an elective cardioversion, which was unsuccessful at restoring sinus rhythm. Therefore we have elected to manage her with rate control and anticoagulation. Today her main complaint is shortness of breath and fatigue which is not much different than it has been in the past.   PMHx:  Past Medical History  Diagnosis Date  . Allergy   . Hypothyroidism   . COPD (chronic obstructive pulmonary disease)   .  Chronic kidney disease   . GERD (gastroesophageal reflux disease)   . Renal insufficiency   . Barrett's esophagus   . Hypertension   . Depression   . Arthritis   . History of esophageal stricture   . Atrial fibrillation     2D ECHO, 08/12/2012 - EF >55%, LA moderately dilated, RA moderately dilated, moderate tricuspid valve regurgitation    Past Surgical History  Procedure Laterality Date  . Facial cosmetic surgery  2000  . Abdominal hysterectomy  1970  . Abdominal hysterectomy    . Cardioversion  09/14/2012    Procedure: CARDIOVERSION;  Surgeon: Chrystie Nose, MD;  Location: [facility] ENDOSCOPY;  Service: Cardiovascular;  Laterality: N/A;    FAMHx:  History reviewed. No pertinent family history.  SOCHx:   reports that she has quit smoking. Her smoking use included Cigarettes. She smoked 0.00 packs per day. She does not have any smokeless tobacco history on file. She reports that she does not drink alcohol or use illicit drugs.  ALLERGIES:  Allergies  Allergen Reactions  . Antihistamines, Chlorpheniramine-Type Other (See Comments)    ROS: A comprehensive review of systems was negative except for: Constitutional: positive for fatigue and malaise Respiratory: positive for dyspnea on exertion and emphysema Cardiovascular: positive for dyspnea  HOME MEDS: Current Outpatient Prescriptions  Medication Sig Dispense Refill  . Acetaminophen (TYLENOL EXTRA STRENGTH PO) Take 1 tablet by mouth as needed.      Marland Kitchen allopurinol (ZYLOPRIM) 100 MG tablet Take 100 mg by mouth daily.       Marland Kitchen aspirin 81 MG  tablet Take 81 mg by mouth daily.        . Cholecalciferol (VITAMIN D-3) 1000 UNITS CAPS Take 1,000 Units by mouth daily.      Marland Kitchen CINNAMON PO Take by mouth daily.      . citalopram (CELEXA) 20 MG tablet Take 20 mg by mouth daily.      . dabigatran (PRADAXA) 75 MG CAPS Take 150 mg by mouth every 12 (twelve) hours.       Marland Kitchen dexlansoprazole (DEXILANT) 60 MG capsule Take 60 mg by mouth daily.          Chilton Si Tea, Camillia sinensis, (GREEN TEA PO) Take by mouth daily.      Marland Kitchen HYDROcodone-acetaminophen (LORTAB) 7.5-500 MG per tablet Take 1 tablet by mouth as needed for pain.      Marland Kitchen levothyroxine (SYNTHROID, LEVOTHROID) 175 MCG tablet Take 175 mcg by mouth daily.        . metoprolol succinate (TOPROL-XL) 25 MG 24 hr tablet Take 25 mg by mouth 2 (two) times daily.       . Multiple Vitamin (MULTIVITAMIN) tablet Take 1 tablet by mouth daily.      . Naproxen Sodium (ALEVE) 220 MG CAPS Take by mouth as needed.      . Omega-3 Fatty Acids (FISH OIL PO) Take by mouth 2 (two) times a week.      . Probiotic Product (ALIGN PO) Take by mouth daily.      . rosuvastatin (CRESTOR) 10 MG tablet Take 10 mg by mouth daily.        . Telmisartan-Amlodipine (TWYNSTA) 80-5 MG TABS Take by mouth daily.        No current facility-administered medications for this visit.   Facility-Administered Medications Ordered in Other Visits  Medication Dose Route Frequency Provider Last Rate Last Dose  . sodium chloride 0.9 % injection 3 mL  3 mL Intravenous PRN Chrystie Nose, MD        LABS/IMAGING: No results found for this or any previous visit (from the past 48 hour(s)). No results found.  VITALS: BP 126/60  Pulse 97  Ht 5\' 6"  (1.676 m)  Wt 282 lb 9.6 oz (128.187 kg)  BMI 45.63 kg/m2  EXAM: General appearance: alert and no distress Neck: no adenopathy, no carotid bruit, no JVD, supple, symmetrical, trachea midline and thyroid not enlarged, symmetric, no tenderness/mass/nodules Lungs: clear to auscultation bilaterally Heart: irregularly irregular rhythm Abdomen: Morbidly obese Extremities: extremities normal, atraumatic, no cyanosis or edema Pulses: 2+ and symmetric Skin: Skin color, texture, turgor normal. No rashes or lesions Neurologic: Grossly normal  EKG: Atrial fibrillation at 97  ASSESSMENT: 1. Chronic atrial fibrillation on Pradaxa 150 mg twice daily, recent failed cardioversion  attempt 2. COPD with shortness of breath 3. Hypertension 4. Dyslipidemia 5. Morbid obesity 6. History of esophageal stricture and numerous dilatations 7. Moderately dilated left atrium 8. History of esophageal stricture  PLAN: 1.   Ms. Jocelyn continues to have shortness of breath which is not entirely attributable to atrial fibrillation. Unfortunately she failed cardioversion and will continue to focus on rate control. She is not having any bleeding issues on Pradaxa.  Heart rate is generally well controlled on her current medications. Plan is continue the current medications we'll see her back in 6 months to year  Chrystie Nose, MD, [facility] Attending Cardiologist The [facility]  Yuma Pacella C 05/13/2013, 7:12 PM

## 2013-07-14 DIAGNOSIS — E789 Disorder of lipoprotein metabolism, unspecified: Secondary | ICD-10-CM | POA: Diagnosis not present

## 2013-07-21 DIAGNOSIS — E789 Disorder of lipoprotein metabolism, unspecified: Secondary | ICD-10-CM | POA: Diagnosis not present

## 2013-07-21 DIAGNOSIS — E0789 Other specified disorders of thyroid: Secondary | ICD-10-CM | POA: Diagnosis not present

## 2013-07-21 DIAGNOSIS — I10 Essential (primary) hypertension: Secondary | ICD-10-CM | POA: Diagnosis not present

## 2013-07-21 DIAGNOSIS — R0609 Other forms of dyspnea: Secondary | ICD-10-CM | POA: Diagnosis not present

## 2013-08-04 ENCOUNTER — Encounter: Payer: Self-pay | Admitting: Gastroenterology

## 2013-09-01 ENCOUNTER — Other Ambulatory Visit (HOSPITAL_COMMUNITY): Payer: Self-pay | Admitting: Endocrinology

## 2013-09-01 DIAGNOSIS — Z1231 Encounter for screening mammogram for malignant neoplasm of breast: Secondary | ICD-10-CM

## 2013-09-14 ENCOUNTER — Encounter: Payer: Self-pay | Admitting: Gastroenterology

## 2013-09-21 ENCOUNTER — Ambulatory Visit (HOSPITAL_COMMUNITY): Payer: Medicare Other

## 2013-09-22 ENCOUNTER — Ambulatory Visit (HOSPITAL_COMMUNITY)
Admission: RE | Admit: 2013-09-22 | Discharge: 2013-09-22 | Disposition: A | Discharge status: Home / Self Care | Payer: Medicare Other | Source: Ambulatory Visit | Attending: Endocrinology | Admitting: Endocrinology

## 2013-09-22 DIAGNOSIS — Z1231 Encounter for screening mammogram for malignant neoplasm of breast: Secondary | ICD-10-CM | POA: Diagnosis not present

## 2013-09-29 ENCOUNTER — Encounter: Payer: Self-pay | Admitting: *Deleted

## 2013-10-05 ENCOUNTER — Encounter: Payer: Self-pay | Admitting: Gastroenterology

## 2013-10-05 ENCOUNTER — Ambulatory Visit (INDEPENDENT_AMBULATORY_CARE_PROVIDER_SITE_OTHER): Payer: Medicare Other | Admitting: Gastroenterology

## 2013-10-05 VITALS — BP 136/78 | HR 62 | Ht 65.0 in | Wt 281.0 lb

## 2013-10-05 DIAGNOSIS — K2270 Barrett's esophagus without dysplasia: Secondary | ICD-10-CM | POA: Diagnosis not present

## 2013-10-05 DIAGNOSIS — K219 Gastro-esophageal reflux disease without esophagitis: Secondary | ICD-10-CM | POA: Diagnosis not present

## 2013-10-05 DIAGNOSIS — J441 Chronic obstructive pulmonary disease with (acute) exacerbation: Secondary | ICD-10-CM | POA: Diagnosis not present

## 2013-10-05 DIAGNOSIS — Z7901 Long term (current) use of anticoagulants: Secondary | ICD-10-CM

## 2013-10-05 DIAGNOSIS — I4891 Unspecified atrial fibrillation: Secondary | ICD-10-CM | POA: Diagnosis not present

## 2013-10-05 NOTE — Progress Notes (Signed)
History of Present Illness:  This is a morbidly obese 72 year old patient with severe COPD who has recently developed atrial fibrillation-atrial flutter and is now on Pradaxa and Metoprol .  She has shortness of breath at rest and with exertion.  I did endoscopy on her several years ago, and she has a short segment of Barrett's mucosa with chronic GERD.  There was no evidence of dysplasia.  She takes Dexilant 60 mg a day and is has completely asymptomatic.  She had colonoscopy by Dr. Sabino Gasser 5 years ago which was unremarkable.  She denies any other GI or hepatobiliary complaints.  Specifically there is no history of substernal chest pain, regurgitation, dysphagia, melena, hematochezia, anorexia or weight loss.  She is on multiple medications listed and reviewed including daily aspirin and when necessary hydrocodone.  She is under the care of Dr. Zoila Shutter [facility] cardiology, and apparently she had an unsuccessful recent cardioversion.  It is unclear as to how long she will be on anticoagulants.  The patient has had COPD for many years and this seems to be progressive in nature.   I have reviewed this patient's present history, medical and surgical past history, allergies and medications.     ROS:   All systems were reviewed and are negative unless otherwise stated in the HPI.    Physical Exam: Blood pressure 136/78, pulse 62 and irregular , weight 281 pounds with a BMI of 46.76.  She is a former smoker. General well developed well nourished patient in no acute distress, appearing their stated age Eyes PERRLA, no icterus, fundoscopic exam per opthamologist Skin no lesions noted Neck supple, no adenopathy, no thyroid enlargement, no tenderness Chest diminished breath sounds with wheezes and rhonchi in both lung fields. Heart no significant murmurs, gallops or rubs noted... irregular somewhat rapid rate noted. Abdomen no hepatosplenomegaly masses or tenderness, BS normal.  She has a  massively obese abdomen but no definite organomegaly masses or tenderness.  Extremities no acute joint lesions, edema, phlebitis or evidence of cellulitis. Neurologic patient oriented x 3, cranial nerves intact, no focal neurologic deficits noted. Psychological mental status normal and normal affect.  Assessment and plan: This patient has chronic GERD and is doing well on daily Dexilant.  She is high risk for endoscopy because of her severe COPD, morbid obesity, atrial fibrillation-atrial flutter, and chronic anticoagulation.  I think the risk of endoscopy  outweighs any benefit of endoscopy and dysplasia biopsy at this time.  She also is up-to-date on her colonoscopy.  I've asked continue to take her Dexilant daily, and to check with GI again in one year's time and we will see how she is doing symptomatically.

## 2013-10-05 NOTE — Patient Instructions (Signed)
Please follow up in one year or as needed with Dr. Jarold Motto

## 2013-11-09 ENCOUNTER — Ambulatory Visit (INDEPENDENT_AMBULATORY_CARE_PROVIDER_SITE_OTHER): Payer: Medicare Other | Admitting: Internal Medicine

## 2013-11-09 ENCOUNTER — Encounter: Payer: Self-pay | Admitting: Internal Medicine

## 2013-11-09 VITALS — BP 132/70 | HR 88 | Ht 65.0 in | Wt 282.4 lb

## 2013-11-09 DIAGNOSIS — Z6841 Body Mass Index (BMI) 40.0 and over, adult: Secondary | ICD-10-CM

## 2013-11-09 DIAGNOSIS — I4891 Unspecified atrial fibrillation: Secondary | ICD-10-CM

## 2013-11-09 DIAGNOSIS — E785 Hyperlipidemia, unspecified: Secondary | ICD-10-CM

## 2013-11-09 DIAGNOSIS — I471 Supraventricular tachycardia, unspecified: Secondary | ICD-10-CM

## 2013-11-09 NOTE — Patient Instructions (Signed)
Your physician wants you to follow-up in: 6 months. You will receive a reminder letter in the mail two months in advance. If you don't receive a letter, please call our office to schedule the follow-up appointment.  Please have Dr. Juleen China sent [place] your lab work.

## 2013-11-09 NOTE — Progress Notes (Signed)
OFFICE NOTE  Chief Complaint:  Routine followup  Primary Care Physician: Michiel Sites, MD  HPI:  Amber Wu is a 72 year old female who has a history of COPD, morbid obesity, gout, hypertension and multiple other problems, including esophageal stricture and numerous dilatations. She was found to be in A-fib and you appropriately started her on Pradaxa as well as a beta blocker. 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 She says her handwriting is now more readable. She seems to be tolerating the Pradaxa without any adverse bleeding complications. She has been on that for now 1 month and therefore should be at low risk of having an intracardiac thrombus. Unfortunately, her heart remains in A-fib with a better controlled ventricular response at 99. She is only mildly aware of this A-fib. We discussed pros and cons of A-fib. However, given the fact that this is fairly new onset, I think she deserves an opportunity to try to get back to sinus rhythm. We therefore will pursue an elective DC cardioversion in the next week or so. I will go ahead and keep you informed of the progress of that. Finally, we did an echocardiogram. Her LV systolic function and diastolic function appear to be preserved; however, the left atrium is moderately dilated with a volume of 34 mL/meter squared and otherwise no significant valvular disease. After her last visit she underwent an elective cardioversion, which was unsuccessful at restoring sinus rhythm. Therefore we have elected to manage her with rate control and anticoagulation. Today her main complaint is shortness of breath and fatigue which is not much different than it has been in the past.   She seems to be tolerating her Pradaxa. She's had no bleeding complaints with this. Her atrial fibrillation is rate controlled. She did  report an episode over the holidays where she had significant lower extremity swelling. She was in [place] and contemplated going to the emergency room ultimately took some of her friend's Lasix. This did improve her swelling and it has since not come back. She denies eating too much salt, but when questioned about this she said that salt is good for her fact it was recommended because of her thyroid problems? Which did not make much sense to me.  PMHx:  Past Medical History  Diagnosis Date  . Allergy   . Hypothyroidism   . COPD (chronic obstructive pulmonary disease)   . Chronic kidney disease   . GERD (gastroesophageal reflux disease)   . Renal insufficiency   . Barrett's esophagus   . Hypertension   . Depression   . Arthritis   . History of esophageal stricture   . Atrial fibrillation     2D ECHO, 08/12/2012 - EF >55%, LA moderately dilated, RA moderately dilated, moderate tricuspid valve regurgitation  . Gastroparesis   . Hiatal hernia     Past Surgical History  Procedure Laterality Date  . Facial cosmetic surgery  2000  . Abdominal hysterectomy  1970  . Cardioversion  09/14/2012    Procedure: CARDIOVERSION;  Surgeon: Chrystie Nose, MD;  Location: [facility] ENDOSCOPY;  Service: Cardiovascular;  Laterality: N/A;    FAMHx:  Family History  Problem Relation Age of Onset  . Colon cancer Neg Hx     SOCHx:   reports that she quit smoking about 20 years ago. Her smoking use included Cigarettes. She smoked 0.00 packs per  day. She has never used smokeless tobacco. She reports that she does not drink alcohol or use illicit drugs.  ALLERGIES:  Allergies  Allergen Reactions  . Antihistamines, Chlorpheniramine-Type Other (See Comments)    ROS: A comprehensive review of systems was negative except for: Constitutional: positive for fatigue and malaise Respiratory: positive for dyspnea on exertion and emphysema Cardiovascular: positive for dyspnea  HOME MEDS: Current Outpatient  Prescriptions  Medication Sig Dispense Refill  . Acetaminophen (TYLENOL EXTRA STRENGTH PO) Take 1 tablet by mouth as needed.      Marland Kitchen allopurinol (ZYLOPRIM) 100 MG tablet Take 100 mg by mouth 2 (two) times daily.       Marland Kitchen aspirin 81 MG tablet Take 81 mg by mouth daily.        . Cholecalciferol (VITAMIN D-3) 1000 UNITS CAPS Take 1,000 Units by mouth daily.      Marland Kitchen CINNAMON PO Take by mouth daily.      . citalopram (CELEXA) 20 MG tablet Take 20 mg by mouth daily.      . dabigatran (PRADAXA) 75 MG CAPS Take 150 mg by mouth every 12 (twelve) hours.       Marland Kitchen dexlansoprazole (DEXILANT) 60 MG capsule Take 60 mg by mouth daily.        Chilton Si Tea, Camillia sinensis, (GREEN TEA PO) Take by mouth daily.      Marland Kitchen HYDROcodone-acetaminophen (LORTAB) 7.5-500 MG per tablet Take 1 tablet by mouth as needed for pain.      Marland Kitchen levothyroxine (SYNTHROID, LEVOTHROID) 175 MCG tablet Take 175 mcg by mouth daily.        . metoprolol succinate (TOPROL-XL) 25 MG 24 hr tablet Take 25 mg by mouth 2 (two) times daily.       . Multiple Vitamin (MULTIVITAMIN) tablet Take 1 tablet by mouth daily.      . Naproxen Sodium (ALEVE) 220 MG CAPS Take by mouth as needed.      . Omega-3 Fatty Acids (FISH OIL PO) Take 1 capsule by mouth 3 (three) times a week.       . Probiotic Product (ALIGN PO) Take by mouth daily.      . rosuvastatin (CRESTOR) 10 MG tablet Take 10 mg by mouth daily.        . Telmisartan-Amlodipine (TWYNSTA) 80-5 MG TABS Take by mouth daily.        No current facility-administered medications for this visit.   Facility-Administered Medications Ordered in Other Visits  Medication Dose Route Frequency Provider Last Rate Last Dose  . sodium chloride 0.9 % injection 3 mL  3 mL Intravenous PRN Chrystie Nose, MD        LABS/IMAGING: No results found for this or any previous visit (from the past 48 hour(s)). No results found.  VITALS: BP 132/70  Pulse 88  Ht 5\' 5"  (1.651 m)  Wt 282 lb 6.4 oz (128.096 kg)  BMI 46.99  kg/m2  EXAM: General appearance: alert and no distress Neck: no adenopathy, no carotid bruit, no JVD, supple, symmetrical, trachea midline and thyroid not enlarged, symmetric, no tenderness/mass/nodules Lungs: clear to auscultation bilaterally Heart: irregularly irregular rhythm Abdomen: Morbidly obese Extremities: extremities normal, atraumatic, trace bilateral edema Pulses: 2+ and symmetric Skin: Skin color, texture, turgor normal. No rashes or lesions Neurologic: Grossly normal Psych: Mildly anxious  EKG: Atrial fibrillation at 88  ASSESSMENT: 1. Chronic atrial fibrillation on Pradaxa 150 mg twice daily, recent failed cardioversion attempt 2. COPD with shortness of breath 3. Hypertension 4.  Dyslipidemia 5. Morbid obesity 6. History of esophageal stricture and numerous dilatations 7. Moderately dilated left atrium 8. History of esophageal stricture  PLAN: 1.   Amber Wu continues to have shortness of breath which is not entirely attributable to atrial fibrillation. Unfortunately she failed cardioversion and will continue to focus on rate control. She is not having any bleeding issues on Pradaxa.  Heart rate is generally well controlled on her current medications. Plan is continue the current medications we'll see her back in 1 year.  Chrystie Nose, MD, [facility] Attending Cardiologist The [facility]  Fadel Clason C 11/09/2013, 6:08 PM

## 2013-11-10 ENCOUNTER — Encounter: Payer: Self-pay | Admitting: Internal Medicine

## 2013-11-15 DIAGNOSIS — E789 Disorder of lipoprotein metabolism, unspecified: Secondary | ICD-10-CM | POA: Diagnosis not present

## 2013-11-22 DIAGNOSIS — R609 Edema, unspecified: Secondary | ICD-10-CM | POA: Diagnosis not present

## 2013-11-22 DIAGNOSIS — E789 Disorder of lipoprotein metabolism, unspecified: Secondary | ICD-10-CM | POA: Diagnosis not present

## 2013-11-22 DIAGNOSIS — I10 Essential (primary) hypertension: Secondary | ICD-10-CM | POA: Diagnosis not present

## 2014-03-22 DIAGNOSIS — Z79899 Other long term (current) drug therapy: Secondary | ICD-10-CM | POA: Diagnosis not present

## 2014-03-22 DIAGNOSIS — N390 Urinary tract infection, site not specified: Secondary | ICD-10-CM | POA: Diagnosis not present

## 2014-03-22 DIAGNOSIS — E0789 Other specified disorders of thyroid: Secondary | ICD-10-CM | POA: Diagnosis not present

## 2014-03-22 DIAGNOSIS — E789 Disorder of lipoprotein metabolism, unspecified: Secondary | ICD-10-CM | POA: Diagnosis not present

## 2014-03-22 DIAGNOSIS — I10 Essential (primary) hypertension: Secondary | ICD-10-CM | POA: Diagnosis not present

## 2014-03-29 DIAGNOSIS — E789 Disorder of lipoprotein metabolism, unspecified: Secondary | ICD-10-CM | POA: Diagnosis not present

## 2014-03-29 DIAGNOSIS — I4891 Unspecified atrial fibrillation: Secondary | ICD-10-CM | POA: Diagnosis not present

## 2014-03-29 DIAGNOSIS — M109 Gout, unspecified: Secondary | ICD-10-CM | POA: Diagnosis not present

## 2014-03-29 DIAGNOSIS — I10 Essential (primary) hypertension: Secondary | ICD-10-CM | POA: Diagnosis not present

## 2014-04-08 DIAGNOSIS — H2510 Age-related nuclear cataract, unspecified eye: Secondary | ICD-10-CM | POA: Diagnosis not present

## 2014-04-08 DIAGNOSIS — H3560 Retinal hemorrhage, unspecified eye: Secondary | ICD-10-CM | POA: Diagnosis not present

## 2014-04-08 DIAGNOSIS — H35039 Hypertensive retinopathy, unspecified eye: Secondary | ICD-10-CM | POA: Diagnosis not present

## 2014-04-08 DIAGNOSIS — H25019 Cortical age-related cataract, unspecified eye: Secondary | ICD-10-CM | POA: Diagnosis not present

## 2014-04-08 DIAGNOSIS — H524 Presbyopia: Secondary | ICD-10-CM | POA: Diagnosis not present

## 2014-04-19 ENCOUNTER — Encounter (INDEPENDENT_AMBULATORY_CARE_PROVIDER_SITE_OTHER): Payer: Medicare Other | Admitting: Ophthalmology

## 2014-04-19 DIAGNOSIS — H35039 Hypertensive retinopathy, unspecified eye: Secondary | ICD-10-CM | POA: Diagnosis not present

## 2014-04-19 DIAGNOSIS — H3560 Retinal hemorrhage, unspecified eye: Secondary | ICD-10-CM

## 2014-04-19 DIAGNOSIS — H43819 Vitreous degeneration, unspecified eye: Secondary | ICD-10-CM

## 2014-04-19 DIAGNOSIS — H2510 Age-related nuclear cataract, unspecified eye: Secondary | ICD-10-CM

## 2014-04-19 DIAGNOSIS — I10 Essential (primary) hypertension: Secondary | ICD-10-CM

## 2014-05-03 DIAGNOSIS — H2510 Age-related nuclear cataract, unspecified eye: Secondary | ICD-10-CM | POA: Diagnosis not present

## 2014-05-03 DIAGNOSIS — H269 Unspecified cataract: Secondary | ICD-10-CM | POA: Diagnosis not present

## 2014-06-03 DIAGNOSIS — H2510 Age-related nuclear cataract, unspecified eye: Secondary | ICD-10-CM | POA: Diagnosis not present

## 2014-06-03 DIAGNOSIS — H25019 Cortical age-related cataract, unspecified eye: Secondary | ICD-10-CM | POA: Diagnosis not present

## 2014-06-14 DIAGNOSIS — H2510 Age-related nuclear cataract, unspecified eye: Secondary | ICD-10-CM | POA: Diagnosis not present

## 2014-06-14 DIAGNOSIS — H269 Unspecified cataract: Secondary | ICD-10-CM | POA: Diagnosis not present

## 2014-07-11 ENCOUNTER — Telehealth: Payer: Self-pay | Admitting: Internal Medicine

## 2014-07-12 NOTE — Telephone Encounter (Signed)
Closed encounter °

## 2014-08-25 ENCOUNTER — Ambulatory Visit (INDEPENDENT_AMBULATORY_CARE_PROVIDER_SITE_OTHER): Payer: Medicare Other | Admitting: Internal Medicine

## 2014-08-25 ENCOUNTER — Encounter: Payer: Self-pay | Admitting: Internal Medicine

## 2014-08-25 VITALS — BP 130/82 | HR 100 | Ht 65.0 in | Wt 279.9 lb

## 2014-08-25 DIAGNOSIS — I471 Supraventricular tachycardia: Secondary | ICD-10-CM | POA: Diagnosis not present

## 2014-08-25 DIAGNOSIS — J449 Chronic obstructive pulmonary disease, unspecified: Secondary | ICD-10-CM

## 2014-08-25 DIAGNOSIS — R61 Generalized hyperhidrosis: Secondary | ICD-10-CM

## 2014-08-25 DIAGNOSIS — R0602 Shortness of breath: Secondary | ICD-10-CM | POA: Diagnosis not present

## 2014-08-25 DIAGNOSIS — I4891 Unspecified atrial fibrillation: Secondary | ICD-10-CM

## 2014-08-25 DIAGNOSIS — Z6841 Body Mass Index (BMI) 40.0 and over, adult: Secondary | ICD-10-CM

## 2014-08-25 DIAGNOSIS — E785 Hyperlipidemia, unspecified: Secondary | ICD-10-CM

## 2014-08-25 NOTE — Progress Notes (Signed)
OFFICE NOTE  Chief Complaint:  Routine followup  Primary Care Physician: Dwan Bolt, MD  HPI:  Amber Wu is a 73 year old female who has a history of COPD, morbid obesity, gout, hypertension and multiple other problems, including esophageal stricture and numerous dilatations. She was found to be in A-fib and you appropriately started her on Pradaxa as well as a beta blocker. 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 She says her handwriting is now more readable. She seems to be tolerating the Pradaxa without any adverse bleeding complications. She has been on that for now 1 month and therefore should be at low risk of having an intracardiac thrombus. Unfortunately, her heart remains in A-fib with a better controlled ventricular response at 99. She is only mildly aware of this A-fib. We discussed pros and cons of A-fib. However, given the fact that this is fairly new onset, I think she deserves an opportunity to try to get back to sinus rhythm. We therefore will pursue an elective DC cardioversion in the next week or so. I will go ahead and keep you informed of the progress of that. Finally, we did an echocardiogram. Her LV systolic function and diastolic function appear to be preserved; however, the left atrium is moderately dilated with a volume of 34 mL/meter squared and otherwise no significant valvular disease. After her last visit she underwent an elective cardioversion, which was unsuccessful at restoring sinus rhythm. Therefore we have elected to manage her with rate control and anticoagulation. Today her main complaint is shortness of breath and fatigue which is not much different than it has been in the past.   She seems to be tolerating her Pradaxa. She's had no bleeding complaints with this. Her atrial fibrillation is rate controlled. She did  report an episode over the holidays where she had significant lower extremity swelling. She was in [place] and contemplated going to the emergency room ultimately took some of her friend's Lasix. This did improve her swelling and it has since not come back. She denies eating too much salt, but when questioned about this she said that salt is good for her fact it was recommended because of her thyroid problems? Which did not make much sense to me.  Amber Wu returns today for followup. She feels easily she's been having progressive shortness of breath. She does not think this is attributed to her weight of because it is been stable. She also had noted that she is unable to do most activities. She gets progressively weak and fatigued doing simple activities such as grocery shopping. She noted the other day that she became profusely diuretic when walking through [employer] which was unusual for her. Despite this, she does not report any chest pain.  PMHx:  Past Medical History  Diagnosis Date  . Allergy   . Hypothyroidism   . COPD (chronic obstructive pulmonary disease)   . Chronic kidney disease   . GERD (gastroesophageal reflux disease)   . Renal insufficiency   . Barrett's esophagus   . Hypertension   . Depression   . Arthritis   . History of esophageal stricture   . Atrial fibrillation     2D ECHO, 08/12/2012 - EF >55%, LA moderately dilated, RA moderately dilated, moderate tricuspid valve regurgitation  . Gastroparesis   . Hiatal hernia     Past Surgical History  Procedure  Laterality Date  . Facial cosmetic surgery  2000  . Abdominal hysterectomy  1970  . Cardioversion  09/14/2012    Procedure: CARDIOVERSION;  Surgeon: Pixie Casino, MD;  Location: [facility] ENDOSCOPY;  Service: Cardiovascular;  Laterality: N/A;  . Cataract extraction Bilateral 06/2014-08/2014    FAMHx:  Family History  Problem Relation Age of Onset  . Colon cancer Neg Hx     SOCHx:   reports that she quit smoking  about 20 years ago. Her smoking use included Cigarettes. She smoked 0.00 packs per day. She has never used smokeless tobacco. She reports that she does not drink alcohol or use illicit drugs.  ALLERGIES:  Allergies  Allergen Reactions  . Antihistamines, Chlorpheniramine-Type Other (See Comments)    ROS: A comprehensive review of systems was negative except for: Constitutional: positive for fatigue and malaise Respiratory: positive for dyspnea on exertion and emphysema Cardiovascular: positive for dyspnea Integument/breast: positive for diaphoresis  HOME MEDS: Current Outpatient Prescriptions  Medication Sig Dispense Refill  . Acetaminophen (TYLENOL EXTRA STRENGTH PO) Take 1 tablet by mouth as needed.      Marland Kitchen allopurinol (ZYLOPRIM) 100 MG tablet Take 100 mg by mouth 2 (two) times daily.       Marland Kitchen aspirin 81 MG tablet Take 81 mg by mouth daily.        . Cholecalciferol (VITAMIN D-3) 1000 UNITS CAPS Take 1,000 Units by mouth daily.      . citalopram (CELEXA) 20 MG tablet Take 20 mg by mouth daily.      . dabigatran (PRADAXA) 75 MG CAPS Take 150 mg by mouth every 12 (twelve) hours.       Marland Kitchen dexlansoprazole (DEXILANT) 60 MG capsule Take 60 mg by mouth daily.        Marland Kitchen HYDROcodone-acetaminophen (LORTAB) 7.5-500 MG per tablet Take 1 tablet by mouth as needed for pain.      Marland Kitchen levothyroxine (SYNTHROID, LEVOTHROID) 175 MCG tablet Take 175 mcg by mouth daily.        . metoprolol succinate (TOPROL-XL) 25 MG 24 hr tablet Take 25 mg by mouth 2 (two) times daily.       . Multiple Vitamin (MULTIVITAMIN) tablet Take 1 tablet by mouth daily.      . Naproxen Sodium (ALEVE) 220 MG CAPS Take by mouth as needed.      . Omega-3 Fatty Acids (FISH OIL PO) Take 1 capsule by mouth 3 (three) times a week.       . Probiotic Product (ALIGN PO) Take by mouth daily.      . rosuvastatin (CRESTOR) 10 MG tablet Take 10 mg by mouth daily.        . Telmisartan-Amlodipine (TWYNSTA) 80-5 MG TABS Take by mouth daily.        No  current facility-administered medications for this visit.   Facility-Administered Medications Ordered in Other Visits  Medication Dose Route Frequency Provider Last Rate Last Dose  . sodium chloride 0.9 % injection 3 mL  3 mL Intravenous PRN Pixie Casino, MD        LABS/IMAGING: No results found for this or any previous visit (from the past 48 hour(s)). No results found.  VITALS: BP 130/82  Pulse 100  Ht 5\' 5"  (1.651 m)  Wt 279 lb 14.4 oz (126.962 kg)  BMI 46.58 kg/m2  EXAM: General appearance: alert and no distress Neck: no adenopathy, no carotid bruit, no JVD, supple, symmetrical, trachea midline and thyroid not enlarged, symmetric, no tenderness/mass/nodules Lungs: clear to  auscultation bilaterally Heart: irregularly irregular rhythm Abdomen: Morbidly obese Extremities: extremities normal, atraumatic, trace bilateral edema Pulses: 2+ and symmetric Skin: Skin color, texture, turgor normal. No rashes or lesions Neurologic: Grossly normal Psych: Mildly anxious  EKG: Atrial fibrillation at 100  ASSESSMENT: 1. Chronic atrial fibrillation on Pradaxa 150 mg twice daily, recent failed cardioversion attempt 2. COPD with shortness of breath 3. Hypertension 4. Dyslipidemia 5. Morbid obesity 6. History of esophageal stricture and numerous dilatations 7. Moderately dilated left atrium 8. History of esophageal stricture  PLAN: 1.   Amber Wu reports worsening shortness of breath and fatigued with minimal exertion. She feels that this is significant and new and is very concerned that this could be do to coronary disease. She's not had coronary evaluation do lack of angina although does have risk factors and may be at risk for coronary disease. She remains in atrial fibrillation. I would recommend a nuclear stress test to further evaluate for ischemia. Her symptoms also could be related to worsening COPD and she may ultimately need pulmonary evaluation. For now, we will workup  possible coronary disease as a cause of her worsening fatigue, dyspnea and diaphoresis.  Pixie Casino, MD, [facility] Attending Cardiologist [facility] [place] [facility] 08/25/2014, 4:45 PM

## 2014-08-25 NOTE — Patient Instructions (Signed)
Your physician has requested that you have a lexiscan myoview. For further information please visit [email or web address]. Please follow instruction sheet, as given. ** 2 day protocol ** we will call you with the test results.   Your physician wants you to follow-up in: 6 months with Dr. Debara Pickett. You will receive a reminder letter in the mail two months in advance. If you don't receive a letter, please call our office to schedule the follow-up appointment.

## 2014-08-31 ENCOUNTER — Other Ambulatory Visit (HOSPITAL_COMMUNITY): Payer: Self-pay | Admitting: Endocrinology

## 2014-08-31 DIAGNOSIS — Z1231 Encounter for screening mammogram for malignant neoplasm of breast: Secondary | ICD-10-CM

## 2014-09-07 ENCOUNTER — Telehealth (HOSPITAL_COMMUNITY): Payer: Self-pay

## 2014-09-07 NOTE — Telephone Encounter (Signed)
Encounter complete. 

## 2014-09-13 ENCOUNTER — Ambulatory Visit (HOSPITAL_COMMUNITY)
Admission: RE | Admit: 2014-09-13 | Discharge: 2014-09-13 | Disposition: A | Discharge status: Home / Self Care | Payer: Medicare Other | Source: Ambulatory Visit | Attending: Cardiovascular Disease | Admitting: Cardiovascular Disease

## 2014-09-13 DIAGNOSIS — R61 Generalized hyperhidrosis: Secondary | ICD-10-CM

## 2014-09-13 DIAGNOSIS — R42 Dizziness and giddiness: Secondary | ICD-10-CM | POA: Insufficient documentation

## 2014-09-13 DIAGNOSIS — R0600 Dyspnea, unspecified: Secondary | ICD-10-CM | POA: Insufficient documentation

## 2014-09-13 DIAGNOSIS — R0602 Shortness of breath: Secondary | ICD-10-CM | POA: Diagnosis not present

## 2014-09-13 DIAGNOSIS — R5383 Other fatigue: Secondary | ICD-10-CM | POA: Insufficient documentation

## 2014-09-13 DIAGNOSIS — J449 Chronic obstructive pulmonary disease, unspecified: Secondary | ICD-10-CM | POA: Insufficient documentation

## 2014-09-13 DIAGNOSIS — E669 Obesity, unspecified: Secondary | ICD-10-CM | POA: Insufficient documentation

## 2014-09-13 DIAGNOSIS — E785 Hyperlipidemia, unspecified: Secondary | ICD-10-CM | POA: Insufficient documentation

## 2014-09-13 DIAGNOSIS — I10 Essential (primary) hypertension: Secondary | ICD-10-CM | POA: Insufficient documentation

## 2014-09-13 DIAGNOSIS — R002 Palpitations: Secondary | ICD-10-CM | POA: Insufficient documentation

## 2014-09-13 DIAGNOSIS — Z8249 Family history of ischemic heart disease and other diseases of the circulatory system: Secondary | ICD-10-CM | POA: Diagnosis not present

## 2014-09-13 DIAGNOSIS — I4891 Unspecified atrial fibrillation: Secondary | ICD-10-CM | POA: Insufficient documentation

## 2014-09-13 MED ORDER — REGADENOSON 0.4 MG/5ML IV SOLN
0.4000 mg | Freq: Once | INTRAVENOUS | Status: AC
  Administered 2014-09-13: 0.4 mg via INTRAVENOUS

## 2014-09-13 MED ORDER — AMINOPHYLLINE 25 MG/ML IV SOLN
125.0000 mg | Freq: Once | INTRAVENOUS | Status: AC
  Administered 2014-09-13: 125 mg via INTRAVENOUS

## 2014-09-13 MED ORDER — TECHNETIUM TC 99M SESTAMIBI GENERIC - CARDIOLITE
31.0000 | Freq: Once | INTRAVENOUS | Status: AC | PRN
Start: 2014-09-13 — End: 2014-09-13
  Administered 2014-09-13: 31 via INTRAVENOUS

## 2014-09-13 NOTE — Procedures (Addendum)
[place] [address] [place] [address] [phone]  Cardiology Nuclear Med Study  Amber Wu is a 73 y.o. female     MRN : [id number]     DOB: Nov 30, 1941  Procedure Date: 09/13/2014  Nuclear Med Background Indication for Stress Test:  Evaluation for Ischemia and Abnormal EKG History:  COPD and AFIB;No prior NUC MPI for comparison;ECHO on 08/12/2012-EF=55%. Cardiac Risk Factors: Family History - CAD, History of Smoking, Hypertension, Lipids and Obesity  Symptoms:  Dizziness, DOE, Fatigue, Light-Headedness, Palpitations and SOB   Nuclear Pre-Procedure Caffeine/Decaff Intake:  1:00am NPO After: 11am   IV Site: R Hand  IV 0.9% NS with Angio Cath:  22g  Chest Size (in):  n/a IV Started by: Rolene Course, RN  Height: 5\' 5"  (1.651 m)  Cup Size: DD  BMI:  Body mass index is 46.43 kg/(m^2). Weight:  279 lb (126.554 kg)   Tech Comments:  n/a    Nuclear Med Study 1 or 2 day study: 2 day  Stress Test Type:  Lexiscan  Order Authorizing Provider:  Lyman Bishop, MD   Resting Radionuclide: Technetium 36m Sestamibi  Resting Radionuclide Dose: 30.9 mCi   Stress Radionuclide:  Technetium 70m Sestamibi  Stress Radionuclide Dose: 31.0 mCi           Stress Protocol Rest HR: 108 Stress HR: 112  Rest BP: 112/76 Stress BP: 117/84  Exercise Time (min): n/a METS: n/a   Predicted Max HR: 146 bpm % Max HR: 76.71 bpm Rate Pressure Product: 13104  Dose of Adenosine (mg):  n/a Dose of Lexiscan: 0.4 mg  Dose of Atropine (mg): n/a Dose of Dobutamine: n/a mcg/kg/min (at max HR)  Stress Test Technologist: Leane Para, CCT Nuclear Technologist: Imagene Riches, CNMT   Rest Procedure:  Myocardial perfusion imaging was performed at rest 45 minutes following the intravenous administration of Technetium 63m Sestamibi. Stress Procedure:  The patient received IV Lexiscan 0.4 mg over 15-seconds.  Technetium 58m Sestamibi injected at  30-seconds.  Patient experienced SOB, Nausea and 125 mg Aminophylline IV was administered. There were no significant changes with Lexiscan.  Quantitative spect images were obtained after a 45 minute delay.  Transient Ischemic Dilatation (Normal <1.22):  0.77  QGS EDV:  n/a ml QGS ESV:  n/a ml LV Ejection Fraction: Study not gated       Rest ECG: Atrial Fibrilliation  Stress ECG: No significant change from baseline ECG  QPS Raw Data Images:  Normal; no motion artifact; normal heart/lung ratio. Stress Images:  Normal homogeneous uptake in all areas of the myocardium. Rest Images:  Normal homogeneous uptake in all areas of the myocardium. Subtraction (SDS):  No evidence of ischemia.  Impression Exercise Capacity:  Lexiscan with no exercise. BP Response:  Normal blood pressure response. Clinical Symptoms:  No significant symptoms noted. ECG Impression:  No significant ST segment change suggestive of ischemia. Comparison with Prior Nuclear Study: No previous nuclear study performed  Overall Impression:  Normal stress nuclear study.  LV Wall Motion:  Not performed secondary to AFIB   Lorretta Harp, MD  09/14/2014 2:49 PM

## 2014-09-14 ENCOUNTER — Ambulatory Visit (HOSPITAL_COMMUNITY)
Admission: RE | Admit: 2014-09-14 | Discharge: 2014-09-14 | Disposition: A | Discharge status: Home / Self Care | Payer: Medicare Other | Source: Ambulatory Visit | Attending: Cardiovascular Disease | Admitting: Cardiovascular Disease

## 2014-09-14 DIAGNOSIS — Z6841 Body Mass Index (BMI) 40.0 and over, adult: Secondary | ICD-10-CM

## 2014-09-14 DIAGNOSIS — E669 Obesity, unspecified: Secondary | ICD-10-CM | POA: Insufficient documentation

## 2014-09-14 DIAGNOSIS — E785 Hyperlipidemia, unspecified: Secondary | ICD-10-CM | POA: Diagnosis not present

## 2014-09-14 DIAGNOSIS — R9431 Abnormal electrocardiogram [ECG] [EKG]: Secondary | ICD-10-CM | POA: Insufficient documentation

## 2014-09-14 DIAGNOSIS — I10 Essential (primary) hypertension: Secondary | ICD-10-CM | POA: Diagnosis not present

## 2014-09-14 DIAGNOSIS — Z87891 Personal history of nicotine dependence: Secondary | ICD-10-CM | POA: Insufficient documentation

## 2014-09-14 DIAGNOSIS — J449 Chronic obstructive pulmonary disease, unspecified: Secondary | ICD-10-CM | POA: Diagnosis not present

## 2014-09-14 DIAGNOSIS — R0602 Shortness of breath: Secondary | ICD-10-CM | POA: Diagnosis not present

## 2014-09-14 DIAGNOSIS — I4891 Unspecified atrial fibrillation: Secondary | ICD-10-CM | POA: Diagnosis not present

## 2014-09-14 MED ORDER — TECHNETIUM TC 99M SESTAMIBI GENERIC - CARDIOLITE
30.9000 | Freq: Once | INTRAVENOUS | Status: AC | PRN
  Administered 2014-09-14: 30.9 via INTRAVENOUS

## 2014-09-22 DIAGNOSIS — E789 Disorder of lipoprotein metabolism, unspecified: Secondary | ICD-10-CM | POA: Diagnosis not present

## 2014-09-23 ENCOUNTER — Ambulatory Visit (HOSPITAL_COMMUNITY): Payer: Medicare Other

## 2014-09-28 DIAGNOSIS — E875 Hyperkalemia: Secondary | ICD-10-CM | POA: Diagnosis not present

## 2014-09-28 DIAGNOSIS — E789 Disorder of lipoprotein metabolism, unspecified: Secondary | ICD-10-CM | POA: Diagnosis not present

## 2014-09-28 DIAGNOSIS — E032 Hypothyroidism due to medicaments and other exogenous substances: Secondary | ICD-10-CM | POA: Diagnosis not present

## 2014-09-28 DIAGNOSIS — E790 Hyperuricemia without signs of inflammatory arthritis and tophaceous disease: Secondary | ICD-10-CM | POA: Diagnosis not present

## 2014-09-28 DIAGNOSIS — I482 Chronic atrial fibrillation: Secondary | ICD-10-CM | POA: Diagnosis not present

## 2014-09-30 ENCOUNTER — Ambulatory Visit (HOSPITAL_COMMUNITY)
Admission: RE | Admit: 2014-09-30 | Discharge: 2014-09-30 | Disposition: A | Discharge status: Home / Self Care | Payer: Medicare Other | Source: Ambulatory Visit | Attending: Endocrinology | Admitting: Endocrinology

## 2014-09-30 ENCOUNTER — Ambulatory Visit (HOSPITAL_COMMUNITY): Payer: Medicare Other

## 2014-09-30 DIAGNOSIS — Z1231 Encounter for screening mammogram for malignant neoplasm of breast: Secondary | ICD-10-CM | POA: Diagnosis not present

## 2015-03-06 ENCOUNTER — Ambulatory Visit (INDEPENDENT_AMBULATORY_CARE_PROVIDER_SITE_OTHER): Payer: Medicare Other | Admitting: Ophthalmology

## 2015-03-15 ENCOUNTER — Ambulatory Visit (INDEPENDENT_AMBULATORY_CARE_PROVIDER_SITE_OTHER): Payer: Medicare Other | Admitting: Ophthalmology

## 2015-03-15 DIAGNOSIS — H35033 Hypertensive retinopathy, bilateral: Secondary | ICD-10-CM

## 2015-03-15 DIAGNOSIS — H43813 Vitreous degeneration, bilateral: Secondary | ICD-10-CM | POA: Diagnosis not present

## 2015-03-15 DIAGNOSIS — I10 Essential (primary) hypertension: Secondary | ICD-10-CM

## 2015-03-20 ENCOUNTER — Encounter: Payer: Self-pay | Admitting: Internal Medicine

## 2015-03-20 ENCOUNTER — Ambulatory Visit (INDEPENDENT_AMBULATORY_CARE_PROVIDER_SITE_OTHER): Payer: Medicare Other | Admitting: Internal Medicine

## 2015-03-20 VITALS — BP 138/88 | HR 95 | Ht 65.0 in | Wt 279.0 lb

## 2015-03-20 DIAGNOSIS — R0600 Dyspnea, unspecified: Secondary | ICD-10-CM | POA: Diagnosis not present

## 2015-03-20 DIAGNOSIS — I480 Paroxysmal atrial fibrillation: Secondary | ICD-10-CM

## 2015-03-20 DIAGNOSIS — Z79899 Other long term (current) drug therapy: Secondary | ICD-10-CM

## 2015-03-20 DIAGNOSIS — R221 Localized swelling, mass and lump, neck: Secondary | ICD-10-CM

## 2015-03-20 DIAGNOSIS — R0609 Other forms of dyspnea: Secondary | ICD-10-CM

## 2015-03-20 DIAGNOSIS — E039 Hypothyroidism, unspecified: Secondary | ICD-10-CM

## 2015-03-20 DIAGNOSIS — J392 Other diseases of pharynx: Secondary | ICD-10-CM

## 2015-03-20 DIAGNOSIS — R220 Localized swelling, mass and lump, head: Secondary | ICD-10-CM

## 2015-03-20 DIAGNOSIS — E785 Hyperlipidemia, unspecified: Secondary | ICD-10-CM

## 2015-03-20 DIAGNOSIS — R498 Other voice and resonance disorders: Secondary | ICD-10-CM | POA: Insufficient documentation

## 2015-03-20 DIAGNOSIS — Z6841 Body Mass Index (BMI) 40.0 and over, adult: Secondary | ICD-10-CM

## 2015-03-20 NOTE — Progress Notes (Signed)
OFFICE NOTE  Chief Complaint:  Dyspnea and exertion  Primary Care Physician: Dwan Bolt, MD  HPI:  Amber Wu is a 74 year old female who has a history of COPD, morbid obesity, gout, hypertension and multiple other problems, including esophageal stricture and numerous dilatations. She was found to be in A-fib and you appropriately started her on Pradaxa as well as a beta blocker. 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 She says her handwriting is now more readable. She seems to be tolerating the Pradaxa without any adverse bleeding complications. She has been on that for now 1 month and therefore should be at low risk of having an intracardiac thrombus. Unfortunately, her heart remains in A-fib with a better controlled ventricular response at 99. She is only mildly aware of this A-fib. We discussed pros and cons of A-fib. However, given the fact that this is fairly new onset, I think she deserves an opportunity to try to get back to sinus rhythm. We therefore will pursue an elective DC cardioversion in the next week or so. I will go ahead and keep you informed of the progress of that. Finally, we did an echocardiogram. Her LV systolic function and diastolic function appear to be preserved; however, the left atrium is moderately dilated with a volume of 34 mL/meter squared and otherwise no significant valvular disease. After her last visit she underwent an elective cardioversion, which was unsuccessful at restoring sinus rhythm. Therefore we have elected to manage her with rate control and anticoagulation. Today her main complaint is shortness of breath and fatigue which is not much different than it has been in the past.   She seems to be tolerating her Pradaxa. She's had no bleeding complaints with this. Her atrial fibrillation is rate controlled. She  did report an episode over the holidays where she had significant lower extremity swelling. She was in [place] and contemplated going to the emergency room ultimately took some of her friend's Lasix. This did improve her swelling and it has since not come back. She denies eating too much salt, but when questioned about this she said that salt is good for her fact it was recommended because of her thyroid problems? Which did not make much sense to me.  [name] returns today for followup. She feels easily she's been having progressive shortness of breath. She does not think this is attributed to her weight of because it is been stable. She also had noted that she is unable to do most activities. She gets progressively weak and fatigued doing simple activities such as grocery shopping. She noted the other day that she became profusely diuretic when walking through [employer] which was unusual for her. Despite this, she does not report any chest pain.  Some still back in the office today. Again she is complaining of shortness of breath with exertion. Her stress test was performed in the fall which was negative for ischemia. EF was not reported due to lack of gating. She's not had an echo in several years. She has a notable tremor and a significant vocal tremor. 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 I wonder if that's playing a role in her upper airway and causing her to be short of breath. Could also be some degree of diastolic or systolic dysfunction. Finally, she is reported a mass in  her left neck. She's felt swelling has been getting worse over the past several months. She is scheduled to see her primary care provider in about a month.  PMHx:  Past Medical History  Diagnosis Date  . Allergy   . Hypothyroidism   . COPD (chronic obstructive pulmonary disease)   . Chronic kidney disease   . GERD (gastroesophageal reflux disease)   . Renal insufficiency   . Barrett's esophagus   .  Hypertension   . Depression   . Arthritis   . History of esophageal stricture   . Atrial fibrillation     2D ECHO, 08/12/2012 - EF >55%, LA moderately dilated, RA moderately dilated, moderate tricuspid valve regurgitation  . Gastroparesis   . Hiatal hernia     Past Surgical History  Procedure Laterality Date  . Facial cosmetic surgery  2000  . Abdominal hysterectomy  1970  . Cardioversion  09/14/2012    Procedure: CARDIOVERSION;  Surgeon: Pixie Casino, MD;  [facility] ENDOSCOPY;  Service: Cardiovascular;  Laterality: N/A;  . Cataract extraction Bilateral 06/2014-08/2014    FAMHx:  Family History  Problem Relation Age of Onset  . Colon cancer Neg Hx     SOCHx:   reports that she quit smoking about 21 years ago. Her smoking use included Cigarettes. She has never used smokeless tobacco. She reports that she does not drink alcohol or use illicit drugs.  ALLERGIES:  Allergies  Allergen Reactions  . Antihistamines, Chlorpheniramine-Type Other (See Comments)    ROS: A comprehensive review of systems was negative except for: Constitutional: positive for fatigue and malaise Ears, nose, mouth, throat, and face: positive for voice change and Left neck mass Respiratory: positive for dyspnea on exertion and emphysema Cardiovascular: positive for dyspnea Integument/breast: positive for diaphoresis  HOME MEDS: Current Outpatient Prescriptions  Medication Sig Dispense Refill  . Acetaminophen (TYLENOL EXTRA STRENGTH PO) Take 1 tablet by mouth as needed.    Marland Kitchen allopurinol (ZYLOPRIM) 100 MG tablet Take 100 mg by mouth 2 (two) times daily.     Marland Kitchen aspirin 81 MG tablet Take 81 mg by mouth daily.      . Cholecalciferol (VITAMIN D-3) 1000 UNITS CAPS Take 1,000 Units by mouth daily.    . citalopram (CELEXA) 20 MG tablet Take 20 mg by mouth daily.    . dabigatran (PRADAXA) 75 MG CAPS Take 150 mg by mouth every 12 (twelve) hours.     Marland Kitchen dexlansoprazole (DEXILANT) 60 MG capsule Take 60 mg by  mouth daily.      Marland Kitchen HYDROcodone-acetaminophen (LORTAB) 7.5-500 MG per tablet Take 1 tablet by mouth as needed for pain.    Marland Kitchen levothyroxine (SYNTHROID, LEVOTHROID) 175 MCG tablet Take 175 mcg by mouth daily.      . metoprolol succinate (TOPROL-XL) 25 MG 24 hr tablet Take 25 mg by mouth 2 (two) times daily.     . Multiple Vitamin (MULTIVITAMIN) tablet Take 1 tablet by mouth daily.    . Naproxen Sodium (ALEVE) 220 MG CAPS Take by mouth as needed.    . Omega-3 Fatty Acids (FISH OIL PO) Take 1 capsule by mouth 3 (three) times a week.     . Probiotic Product (ALIGN PO) Take by mouth daily.    . rosuvastatin (CRESTOR) 10 MG tablet Take 10 mg by mouth daily.      . Telmisartan-Amlodipine (TWYNSTA) 80-5 MG TABS Take by mouth daily.      No current facility-administered medications for this visit.   Facility-Administered Medications  Ordered in Other Visits  Medication Dose Route Frequency Provider Last Rate Last Dose  . sodium chloride 0.9 % injection 3 mL  3 mL Intravenous PRN Pixie Casino, MD        LABS/IMAGING: No results found for this or any previous visit (from the past 48 hour(s)). No results found.  VITALS: BP 138/88 mmHg  Pulse 95  Ht 5\' 5"  (1.651 m)  Wt 279 lb (126.554 kg)  BMI 46.43 kg/m2  EXAM: General appearance: alert and no distress, morbidly obese Neck: Left neck mass, firm no carotid bruit, no JVD Lungs: clear to auscultation bilaterally Heart: irregularly irregular rhythm Abdomen: Morbidly obese Extremities: extremities normal, atraumatic, trace bilateral edema Pulses: 2+ and symmetric Skin: Skin color, texture, turgor normal. No rashes or lesions Neurologic: Grossly normal Psych: Mildly anxious  EKG: Atrial fibrillation at 95  ASSESSMENT: 1. Permanent atrial fibrillation on Pradaxa 150 mg twice daily  2. COPD with shortness of breath 3. Hypertension 4. Dyslipidemia 5. Morbid obesity 6. History of esophageal stricture and numerous  dilatations 7. Moderately dilated left atrium 8. History of esophageal stricture 9. Vocal tremor 10. Left neck mass  PLAN: 1.   Ms. Wanat is reporting worsening shortness of breath with exertion. She's known to have COPD but is not on any inhalers. She said she had adverse reactions to them. So could have some underlying cardiomyopathy or diastolic heart failure. I recommend a repeat echocardiogram as well as a cemented and BNP. She's not currently on a diuretic but might need to be. She does have a significant rest tremor as well as a vocal tremor with worsening voice, suggesting that could be playing a role in upper airway management. She's also complained of some hallucinations recently and perhaps may need to see a neurologist. She could have Parkinson's disorder and/or Parkinson plus syndrome. In addition, she is reporting an enlarging left neck mass. She says it's been going on for several months. On exam it somewhat firm and could be a thyroid nodule. I'm recommending a soft tissue ultrasound of the neck to evaluate for that. I'll check thyroid function as well.  Plan to see her back in a few weeks to discuss results of her studies.  Pixie Casino, MD, [facility] Attending Cardiologist [facility] [place] [facility] 03/20/2015, 4:48 PM

## 2015-03-20 NOTE — Patient Instructions (Signed)
Your physician has requested that you have an echocardiogram. Echocardiography is a painless test that uses sound waves to create images of your heart. It provides your doctor with information about the size and shape of your heart and how well your heart's chambers and valves are working. This procedure takes approximately one hour. There are no restrictions for this procedure.  Dr. Debara Pickett has ordered an ultrasound of your neck to be done at [place]. [place]  Your physician recommends that you return for lab work TODAY  Your physician recommends that you schedule a follow-up appointment in: 1 month

## 2015-03-22 LAB — BASIC METABOLIC PANEL
BUN: 13 mg/dL (ref 6–23)
CO2: 25 mEq/L (ref 19–32)
Calcium: 9.3 mg/dL (ref 8.4–10.5)
Chloride: 105 mEq/L (ref 96–112)
Creat: 0.98 mg/dL (ref 0.50–1.10)
Glucose, Bld: 104 mg/dL — ABNORMAL HIGH (ref 70–99)
Potassium: 5.1 mEq/L (ref 3.5–5.3)
Sodium: 143 mEq/L (ref 135–145)

## 2015-03-22 LAB — TSH: TSH: 0.087 u[IU]/mL — AB (ref 0.350–4.500)

## 2015-03-22 LAB — T4: T4 TOTAL: 7.5 ug/dL (ref 4.5–12.0)

## 2015-03-23 ENCOUNTER — Ambulatory Visit
Admission: RE | Admit: 2015-03-23 | Discharge: 2015-03-23 | Disposition: A | Discharge status: Home / Self Care | Payer: Medicare Other | Source: Ambulatory Visit | Attending: Internal Medicine | Admitting: Internal Medicine

## 2015-03-23 DIAGNOSIS — R221 Localized swelling, mass and lump, neck: Secondary | ICD-10-CM

## 2015-03-23 LAB — BRAIN NATRIURETIC PEPTIDE: Brain Natriuretic Peptide: 130.5 pg/mL — ABNORMAL HIGH (ref 0.0–100.0)

## 2015-03-24 ENCOUNTER — Ambulatory Visit (HOSPITAL_COMMUNITY)
Admission: RE | Admit: 2015-03-24 | Discharge: 2015-03-24 | Disposition: A | Discharge status: Home / Self Care | Payer: Medicare Other | Source: Ambulatory Visit | Attending: Cardiology | Admitting: Cardiology

## 2015-03-24 DIAGNOSIS — R0600 Dyspnea, unspecified: Secondary | ICD-10-CM | POA: Diagnosis not present

## 2015-03-24 NOTE — Progress Notes (Signed)
2D Echocardiogram Complete.  03/24/2015   Amber Wu, Amber Wu

## 2015-03-30 DIAGNOSIS — E789 Disorder of lipoprotein metabolism, unspecified: Secondary | ICD-10-CM | POA: Diagnosis not present

## 2015-03-30 DIAGNOSIS — E875 Hyperkalemia: Secondary | ICD-10-CM | POA: Diagnosis not present

## 2015-03-30 DIAGNOSIS — E032 Hypothyroidism due to medicaments and other exogenous substances: Secondary | ICD-10-CM | POA: Diagnosis not present

## 2015-04-06 DIAGNOSIS — I10 Essential (primary) hypertension: Secondary | ICD-10-CM | POA: Diagnosis not present

## 2015-04-06 DIAGNOSIS — I4891 Unspecified atrial fibrillation: Secondary | ICD-10-CM | POA: Diagnosis not present

## 2015-04-06 DIAGNOSIS — M109 Gout, unspecified: Secondary | ICD-10-CM | POA: Diagnosis not present

## 2015-04-06 DIAGNOSIS — E032 Hypothyroidism due to medicaments and other exogenous substances: Secondary | ICD-10-CM | POA: Diagnosis not present

## 2015-04-06 DIAGNOSIS — J449 Chronic obstructive pulmonary disease, unspecified: Secondary | ICD-10-CM | POA: Diagnosis not present

## 2015-04-18 ENCOUNTER — Encounter: Payer: Self-pay | Admitting: Internal Medicine

## 2015-04-18 ENCOUNTER — Ambulatory Visit (INDEPENDENT_AMBULATORY_CARE_PROVIDER_SITE_OTHER): Payer: Medicare Other | Admitting: Internal Medicine

## 2015-04-18 DIAGNOSIS — R0600 Dyspnea, unspecified: Secondary | ICD-10-CM

## 2015-04-18 DIAGNOSIS — R0609 Other forms of dyspnea: Secondary | ICD-10-CM

## 2015-04-18 NOTE — Patient Instructions (Signed)
Dexilant Take 30-60 min before first meal of the day and add pepcid 20 mg at bedtime  GERD (REFLUX)  is an extremely common cause of respiratory symptoms just like yours , many times with no obvious heartburn at all.    It can be treated with medication, but also with lifestyle changes including avoidance of late meals, excessive alcohol, smoking cessation, and avoid fatty foods, chocolate, peppermint, colas, red wine, and acidic juices such as orange juice.  NO MINT OR MENTHOL PRODUCTS SO NO COUGH DROPS  USE SUGARLESS CANDY INSTEAD (Jolley ranchers or Stover's or Life Savers) or even ice chips will also do - the key is to swallow to prevent all throat clearing. NO OIL BASED VITAMINS - use powdered substitutes.    Please schedule a follow up office visit in 6 weeks, call sooner if needed with pfts on return

## 2015-04-18 NOTE — Progress Notes (Addendum)
Subjective:    Patient ID: Amber Wu, female    DOB: [birth year], MRN: [id number]  HPI  32 yowf quit smoking around 1996 @  Wt < 180 referred by Dr Wilson Singer to pulmonary clinic with progressive doe x 2014 at wt 280 on arrival to pulmonary clinic 04/18/2015    04/18/2015 1st [facility] office visit/ Amber Wu   Chief Complaint  Patient presents with  . Pulmonary Consult    Referred by Dr. Wilson Singer. Pt c/o SOB for the past 2 yrs- progressively getting worse. She states she was out of breath walking from parking lot to our building today. She gets SOB when grocery shopping "unless I push a cart". She c/o cough for the past year, which she states is also getting worse- prod with clear to yellow sputum. Her voice has changed over the years.   doe indolent onset x 2 years when  wt 240  And progressively worse since onset proportionate to wt gain - no better with inhalers -   And variably assoc with sporadic min productivecough and variable voice loss/tremor- cough worse at hs / ? Tremor related to thyroid rx (not familial) vs Parkinsons per notes - denies choking on food or dsyphagia  Doe s    day to day or daytime variabilty or assoc   cp or chest tightness, subjective wheeze overt sinus or hb symptoms. No unusual exp hx or h/o childhood pna/ asthma or knowledge of premature birth.  Sleeping ok without nocturnal  or early am exacerbation  of respiratory  c/o's or need for noct saba. Also denies any obvious fluctuation of symptoms with weather or environmental changes or other aggravating or alleviating factors except as outlined above   Current Medications, Allergies, Complete Past Medical History, Past Surgical History, Family History, and Social History were reviewed in [employer] record.               Review of Systems  Constitutional: Negative for fever, chills and unexpected weight change.  HENT: Positive for congestion and voice change. Negative for dental  problem, ear pain, nosebleeds, postnasal drip, rhinorrhea, sinus pressure, sneezing, sore throat and trouble swallowing.   Eyes: Negative for visual disturbance.  Respiratory: Positive for cough and shortness of breath. Negative for choking.   Cardiovascular: Negative for chest pain and leg swelling.  Gastrointestinal: Negative for vomiting, abdominal pain and diarrhea.  Genitourinary: Negative for difficulty urinating.  Musculoskeletal: Positive for arthralgias.  Skin: Negative for rash.  Neurological: Positive for headaches. Negative for tremors and syncope.  Hematological: Does not bruise/bleed easily.       Objective:   Physical Exam  amb obese wf with halting voice pattern   Wt Readings from Last 3 Encounters:  04/18/15 280 lb 3.2 oz (127.098 kg)  03/20/15 279 lb (126.554 kg)  09/13/14 279 lb (126.554 kg)    Vital signs reviewed   HEENT: nl dentition, turbinates, and orophanx. Nl external ear canals without cough reflex   NECK :  without JVD/Nodes/TM/ nl carotid upstrokes bilaterally   LUNGS: no acc muscle use, clear to A and P bilaterally without cough on insp or exp maneuvers   CV:  RRR  no s3 or murmur or increase in P2, no edema   ABD:  soft and nontender with nl excursion in the supine position. No bruits or organomegaly, bowel sounds nl  MS:  warm without deformities, calf tenderness, cyanosis or clubbing  SKIN: warm and dry without lesions  NEURO:  alert, approp, no deficits     cxr ok w/in the last week per pt      Lab Results  Component Value Date   TSH 0.087* 03/22/2015        Assessment & Plan:

## 2015-04-19 ENCOUNTER — Encounter: Payer: Self-pay | Admitting: Internal Medicine

## 2015-04-19 NOTE — Assessment & Plan Note (Signed)
04/18/2015   Walked RA x one lap @ 185 stopped due to  Sob plus pain in legs/ slow pace, no desat  Symptoms are markedly disproportionate to objective findings and not clear this is a lung problem but pt does appear to have difficult airway management issues. DDX of  difficult airways management all start with A and  include Adherence, Ace Inhibitors, Acid Reflux, Active Sinus Disease, Alpha 1 Antitripsin deficiency, Anxiety masquerading as Airways dz,  ABPA,  allergy(esp in young), Aspiration (esp in elderly), Adverse effects of DPI,  Active smokers, plus two Bs  = Bronchiectasis and Beta blocker use..and one C= CHF    ? Acid (or non-acid) GERD > always difficult to exclude as up to 75% of pts in some series report no assoc GI/ Heartburn symptoms> rec max (24h)  acid suppression and diet restrictions/ reviewed and instructions given in writing.   ? Anxiety/ depression /wt gain > dx of exclusion, needs to return for pfts to sort out  ? chf > neg per cards notes though does have mild LAE at rest   Also worry about thyroid status/ voice tremor ? Element of VCD >  ? Needs neuro eval for parkinson's

## 2015-04-20 ENCOUNTER — Ambulatory Visit (INDEPENDENT_AMBULATORY_CARE_PROVIDER_SITE_OTHER): Payer: Medicare Other | Admitting: Internal Medicine

## 2015-04-20 ENCOUNTER — Encounter: Payer: Self-pay | Admitting: Internal Medicine

## 2015-04-20 VITALS — BP 160/82 | Ht 66.0 in | Wt 279.2 lb

## 2015-04-20 DIAGNOSIS — R498 Other voice and resonance disorders: Secondary | ICD-10-CM

## 2015-04-20 DIAGNOSIS — Z6841 Body Mass Index (BMI) 40.0 and over, adult: Secondary | ICD-10-CM

## 2015-04-20 DIAGNOSIS — R443 Hallucinations, unspecified: Secondary | ICD-10-CM | POA: Diagnosis not present

## 2015-04-20 DIAGNOSIS — I480 Paroxysmal atrial fibrillation: Secondary | ICD-10-CM | POA: Diagnosis not present

## 2015-04-20 DIAGNOSIS — R531 Weakness: Secondary | ICD-10-CM | POA: Insufficient documentation

## 2015-04-20 DIAGNOSIS — R0609 Other forms of dyspnea: Secondary | ICD-10-CM

## 2015-04-20 DIAGNOSIS — R251 Tremor, unspecified: Secondary | ICD-10-CM

## 2015-04-20 DIAGNOSIS — R441 Visual hallucinations: Secondary | ICD-10-CM | POA: Insufficient documentation

## 2015-04-20 DIAGNOSIS — R0600 Dyspnea, unspecified: Secondary | ICD-10-CM

## 2015-04-20 MED ORDER — TELMISARTAN-AMLODIPINE 80-10 MG PO TABS: 1.0000 | ORAL_TABLET | Freq: Every day | ORAL | Status: DC

## 2015-04-20 MED ORDER — FUROSEMIDE 20 MG PO TABS: 20.0000 mg | ORAL_TABLET | Freq: Every day | ORAL | Status: DC | PRN

## 2015-04-20 NOTE — Patient Instructions (Addendum)
We have written you a referral to [facility]  START Lasix as needed for swelling  Your physician wants you to follow-up in: THREE Months. You will receive a reminder letter in the mail two months in advance. If you don't receive a letter, please call our office to schedule the follow-up appointment.  INCREASE your Telmisartan-Amlodipine to 80-10mg 

## 2015-04-20 NOTE — Progress Notes (Signed)
OFFICE NOTE  Chief Complaint:  Dyspnea and exertion, follow-up tests  Primary Care Physician: Dwan Bolt, MD  HPI:  Amber Wu is a 74 year old female who has a history of COPD, morbid obesity, gout, hypertension and multiple other problems, including esophageal stricture and numerous dilatations. She was found to be in A-fib and you appropriately started her on Pradaxa as well as a beta blocker. 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 She says her handwriting is now more readable. She seems to be tolerating the Pradaxa without any adverse bleeding complications. She has been on that for now 1 month and therefore should be at low risk of having an intracardiac thrombus. Unfortunately, her heart remains in A-fib with a better controlled ventricular response at 99. She is only mildly aware of this A-fib. We discussed pros and cons of A-fib. However, given the fact that this is fairly new onset, I think she deserves an opportunity to try to get back to sinus rhythm. We therefore will pursue an elective DC cardioversion in the next week or so. I will go ahead and keep you informed of the progress of that. Finally, we did an echocardiogram. Her LV systolic function and diastolic function appear to be preserved; however, the left atrium is moderately dilated with a volume of 34 mL/meter squared and otherwise no significant valvular disease. After her last visit she underwent an elective cardioversion, which was unsuccessful at restoring sinus rhythm. Therefore we have elected to manage her with rate control and anticoagulation. Today her main complaint is shortness of breath and fatigue which is not much different than it has been in the past.   She seems to be tolerating her Pradaxa. She's had no bleeding complaints with this. Her atrial fibrillation is  rate controlled. She did report an episode over the holidays where she had significant lower extremity swelling. She was in [place] and contemplated going to the emergency room ultimately took some of her friend's Lasix. This did improve her swelling and it has since not come back. She denies eating too much salt, but when questioned about this she said that salt is good for her fact it was recommended because of her thyroid problems? Which did not make much sense to me.  Amber Wu returns today for followup. She feels easily she's been having progressive shortness of breath. She does not think this is attributed to her weight of because it is been stable. She also had noted that she is unable to do most activities. She gets progressively weak and fatigued doing simple activities such as grocery shopping. She noted the other day that she became profusely diuretic when walking through [employer] which was unusual for her. Despite this, she does not report any chest pain.  Some still back in the office today. Again she is complaining of shortness of breath with exertion. Her stress test was performed in the fall which was negative for ischemia. EF was not reported due to lack of gating. She's not had an echo in several years. She has a notable tremor and a significant vocal tremor. 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 I wonder if that's playing a role in her upper airway and causing her to be short of breath. Could also be some degree of diastolic or systolic dysfunction. Finally, she is reported a  mass in her left neck. She's felt swelling has been getting worse over the past several months. She is scheduled to see her primary care provider in about a month.  Amber Wu returns today for follow-up. In the interim she is seen Dr. Melvyn Novas with pulmonary who does not feel she has COPD. He is concerned about upper airway pathology as MI with vocal tremor and change in her symptoms. This could  be worsening her shortness of breath. She also reports profound weakness and and recently has been having hallucinations. I wonder if there is an underlying neurologic responsible for her symptoms. She did have an echo which shows normal systolic function. She may have a degree of diastolic dysfunction and recently blood pressure has been elevated. She reports some more leg swelling.  PMHx:  Past Medical History  Diagnosis Date  . Allergy   . Hypothyroidism   . COPD (chronic obstructive pulmonary disease)   . Chronic kidney disease   . GERD (gastroesophageal reflux disease)   . Renal insufficiency   . Barrett's esophagus   . Hypertension   . Depression   . Arthritis   . History of esophageal stricture   . Atrial fibrillation     2D ECHO, 08/12/2012 - EF >55%, LA moderately dilated, RA moderately dilated, moderate tricuspid valve regurgitation  . Gastroparesis   . Hiatal hernia     Past Surgical History  Procedure Laterality Date  . Facial cosmetic surgery  2000  . Abdominal hysterectomy  1970  . Cardioversion  09/14/2012    Procedure: CARDIOVERSION;  Surgeon: Pixie Casino, MD;  [facility] END[facility]COPY;  Service: Cardiovascular;  Laterality: N/A;  . Cataract extraction Bilateral 06/2014-08/2014    FAMHx:  Family History  Problem Relation Age of Onset  . Colon cancer Neg Hx     SOCHx:   reports that she quit smoking about 21 years ago. Her smoking use included Cigarettes. She has a 7.5 pack-year smoking history. She has never used smokeless tobacco. She reports that she does not drink alcohol or use illicit drugs.  ALLERGIES:  Allergies  Allergen Reactions  . Antihistamines, Chlorpheniramine-Type Other (See Comments)    ROS: A comprehensive review of systems was negative except for: Constitutional: positive for fatigue and malaise Ears, nose, mouth, throat, and face: positive for voice change and Left neck mass Respiratory: positive for dyspnea on exertion and  emphysema Cardiovascular: positive for dyspnea Integument/breast: positive for diaphoresis  HOME MEDS: Current Outpatient Prescriptions  Medication Sig Dispense Refill  . allopurinol (ZYLOPRIM) 100 MG tablet Take 100 mg by mouth 2 (two) times daily.     Marland Kitchen aspirin 81 MG tablet Take 81 mg by mouth daily.      Marland Kitchen Bioflavonoid Products (ESTER C PO) Take 1 tablet by mouth daily.    . Cholecalciferol (VITAMIN D-3) 1000 UNITS CAPS Take 1,000 Units by mouth daily.    . citalopram (CELEXA) 20 MG tablet Take 20 mg by mouth daily.    . colchicine 0.6 MG tablet Take 0.6 mg by mouth daily.    . dabigatran (PRADAXA) 75 MG CAPS Take 150 mg by mouth every 12 (twelve) hours.     Marland Kitchen dexlansoprazole (DEXILANT) 60 MG capsule Take 60 mg by mouth daily.      Marland Kitchen HYDROcodone-acetaminophen (LORTAB) 7.5-500 MG per tablet Take 1 tablet by mouth as needed for pain.    Marland Kitchen ibuprofen (ADVIL,MOTRIN) 200 MG tablet Take 200 mg by mouth every 6 (six) hours as needed.    Marland Kitchen  levothyroxine (SYNTHROID, LEVOTHROID) 175 MCG tablet Take 150 mcg by mouth daily before breakfast.     . metoprolol succinate (TOPROL-XL) 25 MG 24 hr tablet Take 25 mg by mouth 2 (two) times daily.     . Multiple Vitamin (MULTIVITAMIN) tablet Take 1 tablet by mouth daily.    . Naproxen Sodium (ALEVE) 220 MG CAPS Take by mouth as needed.    . Omega-3 Fatty Acids (FISH OIL PO) Take 1 capsule by mouth 3 (three) times a week.     . Psyllium (METAMUCIL PO) Take 1 Package by mouth daily.    . rosuvastatin (CRESTOR) 10 MG tablet Take 10 mg by mouth daily.      . Telmisartan-Amlodipine (TWYNSTA) 80-5 MG TABS Take by mouth daily.     . furosemide (LASIX) 20 MG tablet Take 1 tablet (20 mg total) by mouth daily as needed. 90 tablet 3  . Telmisartan-Amlodipine 80-10 MG TABS Take 1 tablet by mouth daily. 90 tablet 3   No current facility-administered medications for this visit.   Facility-Administered Medications Ordered in Other Visits  Medication Dose Route Frequency  Provider Last Rate Last Dose  . sodium chloride 0.9 % injection 3 mL  3 mL Intravenous PRN Pixie Casino, MD        LABS/IMAGING: No results found for this or any previous visit (from the past 48 hour(s)). No results found.  VITALS: BP 160/82 mmHg  Ht 5\' 6"  (1.676 m)  Wt 279 lb 3.2 oz (126.644 kg)  BMI 45.09 kg/m2  EXAM: General appearance: alert and no distress, morbidly obese Neck: Left neck mass, firm no carotid bruit, no JVD Lungs: clear to auscultation bilaterally Heart: irregularly irregular rhythm Abdomen: Morbidly obese Extremities: extremities normal, atraumatic, trace bilateral edema Pulses: 2+ and symmetric Skin: Skin color, texture, turgor normal. No rashes or lesions Neurologic: Grossly normal Psych: Mildly anxious  EKG: Atrial flutter with variable block at 102  ASSESSMENT: 1. Permanent atrial fibrillation on Pradaxa 150 mg twice daily  2. COPD with shortness of breath 3. Hypertension 4. Dyslipidemia 5. Morbid obesity 6. History of esophageal stricture and numerous dilatations 7. Moderately dilated left atrium 8. History of esophageal stricture 9. Vocal tremor 10. Weakness/visual hallucinations  PLAN: 1.   Amber Wu is reporting shortness of breath which seems to be out of proportion for her findings on echo. In addition from a pulmonary standpoint is not clear that she has obstructive pulmonary disease. She may have upper airway spasm or vocal cord dysfunction causing her shortness of breath. She has vocal tremor and visual hallucinations as well as profound weakness. She says she can't even pick up a 12 pack of soda. Blood pressure has been trending up. I recommend increasing her telmisartan/amlodipine to 80/5 mg daily. We'll also provide Lasix 20 mg as needed for edema and shortness of breath. She's had some hyperkalemia in the recent past. Renal function has improved creatinine of 1.2 with a GFR in the 50s. I would recommend a neurology evaluation and if  placed that to [facility] neurology today.  Plan to see her back in 3 months.  Pixie Casino, MD, [facility] Attending Cardiologist [facility] 04/20/2015, 2:17 PM

## 2015-05-01 ENCOUNTER — Encounter: Payer: Self-pay | Admitting: Neurology

## 2015-05-01 ENCOUNTER — Ambulatory Visit (INDEPENDENT_AMBULATORY_CARE_PROVIDER_SITE_OTHER): Payer: Medicare Other | Admitting: Neurology

## 2015-05-01 VITALS — BP 122/72 | HR 116 | Ht 66.0 in | Wt 275.0 lb

## 2015-05-01 DIAGNOSIS — W19XXXA Unspecified fall, initial encounter: Secondary | ICD-10-CM

## 2015-05-01 DIAGNOSIS — R739 Hyperglycemia, unspecified: Secondary | ICD-10-CM

## 2015-05-01 DIAGNOSIS — R0600 Dyspnea, unspecified: Secondary | ICD-10-CM

## 2015-05-01 DIAGNOSIS — M791 Myalgia, unspecified site: Secondary | ICD-10-CM

## 2015-05-01 DIAGNOSIS — G250 Essential tremor: Secondary | ICD-10-CM

## 2015-05-01 DIAGNOSIS — G518 Other disorders of facial nerve: Secondary | ICD-10-CM | POA: Diagnosis not present

## 2015-05-01 DIAGNOSIS — R5383 Other fatigue: Secondary | ICD-10-CM | POA: Diagnosis not present

## 2015-05-01 DIAGNOSIS — R7309 Other abnormal glucose: Secondary | ICD-10-CM | POA: Diagnosis not present

## 2015-05-01 DIAGNOSIS — R251 Tremor, unspecified: Secondary | ICD-10-CM

## 2015-05-01 DIAGNOSIS — G609 Hereditary and idiopathic neuropathy, unspecified: Secondary | ICD-10-CM | POA: Diagnosis not present

## 2015-05-01 DIAGNOSIS — G629 Polyneuropathy, unspecified: Secondary | ICD-10-CM | POA: Diagnosis not present

## 2015-05-01 DIAGNOSIS — R531 Weakness: Secondary | ICD-10-CM | POA: Diagnosis not present

## 2015-05-01 DIAGNOSIS — E8889 Other specified metabolic disorders: Secondary | ICD-10-CM | POA: Diagnosis not present

## 2015-05-01 DIAGNOSIS — G5139 Clonic hemifacial spasm, unspecified: Secondary | ICD-10-CM

## 2015-05-01 LAB — CK: CK TOTAL: 47 U/L (ref 7–177)

## 2015-05-01 NOTE — Progress Notes (Signed)
Amber Wu was seen today in the movement disorders clinic for neurologic consultation at the request of Dr. Debara Pickett.  The patient's primary care physician is Dwan Bolt, MD.  The consultation is for the evaluation of tremor.  Prior records that were made available to me were reviewed.  The patient has a history of "familial tremor" for years; pt states that she has had tremor all her life and she cannot remember not having tremor.  According to cardiology notes, when the patient's metoprolol was increased for atrial fibrillation, the patient's tremor decreased (pt doesn't remember this). Pt states that she is right hand dominant and both hands shake equally.  She states that she notes the tremor most when anxious. However, now because of the onset of vocal tremor, weakness, shortness of breath and visual hallucinations, she is referred to rule out other disorders that are neurologic in nature.  Her sister does have a hx of tremor.   Tremor: Yes.     Affected by caffeine:  No. (2 cups of coffee in the AM)  Affected by alcohol:  Doesn't drink enough to know (maybe 1-2 times a year)  Affected by stress:  Yes.    Affected by fatigue:  No.  Spills soup if on spoon:  Yes.   (but sometimes may not)  Spills glass of liquid if full:  Yes.    Affects ADL's (tying shoes, brushing teeth, etc):  No.  Other sx's: Voice: has had voice tremor for a few years; no hypophonic speech Sleep: sleeps well  Vivid Dreams:  Yes.    Acting out dreams:  No. Wet Pillows: No. Postural symptoms:  Yes.    Falls?  Yes.   (went to PT for falls about 3 years ago but doesn't think that it helped; states that she will begin to hyperventilate and then feel SOB and will then have palpitations before a fall.  Not sure if related to panic attacks; states that she can do anything sitting down but cannot do things standing up because of exertional dyspnea) Bradykinesia symptoms: slow movements Loss of smell:  No. Loss  of taste:  No. Urinary Incontinence:  No. Difficulty Swallowing:  No. Handwriting, micrographia: Yes.  , but mostly illegible due to tremor Trouble with ADL's:  No.  Trouble buttoning clothing: No. Depression:  No., but admits to anxiety Memory changes:  Yes.  , some short term memory ("like anyone my age") Hallucinations:  Yes.   (only one episode ever a few months ago; 7:30am and had opened one eye and she states that she thought she saw a blue jean leg.  She closed the eye and opened it again and saw it again.  She closed it again and opened it and saw a vivid hallucination of a man in her bedroom that was a cowboy and she started to talk to the figure and after a few minutes she states that it was like a hollogram and it went away; states that she had no new meds; never happened before and never again but does relate that in the past she has had "odd" things happen in the past like the entire house would shake and nothing to explain it or she would hear stuff in the kitchen and she would just say "shut up" and it would go away). N/V:  No. Lightheaded:  Yes.  , when she is SOB  Syncope: No. Diplopia:  No. Dyskinesia:  No.  Neuroimaging has not previously been performed.  ALLERGIES:   Allergies  Allergen Reactions  . Antihistamines, Chlorpheniramine-Type Other (See Comments)    CURRENT MEDICATIONS:  Outpatient Encounter Prescriptions as of 05/01/2015  Medication Sig  . allopurinol (ZYLOPRIM) 100 MG tablet Take 100 mg by mouth 2 (two) times daily.   Marland Kitchen aspirin 81 MG tablet Take 81 mg by mouth daily.    Marland Kitchen Bioflavonoid Products (ESTER C PO) Take 1 tablet by mouth daily.  . Cholecalciferol (VITAMIN D-3) 1000 UNITS CAPS Take 1,000 Units by mouth daily.  . citalopram (CELEXA) 20 MG tablet Take 20 mg by mouth daily.  . colchicine 0.6 MG tablet Take 0.6 mg by mouth daily.  . dabigatran (PRADAXA) 75 MG CAPS Take 150 mg by mouth every 12 (twelve) hours.   Marland Kitchen dexlansoprazole (DEXILANT) 60 MG  capsule Take 60 mg by mouth daily.    . furosemide (LASIX) 20 MG tablet Take 1 tablet (20 mg total) by mouth daily as needed.  Marland Kitchen HYDROcodone-acetaminophen (LORTAB) 7.5-500 MG per tablet Take 1 tablet by mouth as needed for pain.  Marland Kitchen ibuprofen (ADVIL,MOTRIN) 200 MG tablet Take 200 mg by mouth every 6 (six) hours as needed.  Marland Kitchen levothyroxine (SYNTHROID, LEVOTHROID) 175 MCG tablet Take 150 mcg by mouth daily before breakfast.   . metoprolol succinate (TOPROL-XL) 25 MG 24 hr tablet Take 25 mg by mouth 2 (two) times daily.   . Multiple Vitamin (MULTIVITAMIN) tablet Take 1 tablet by mouth daily.  . Naproxen Sodium (ALEVE) 220 MG CAPS Take by mouth as needed.  . Omega-3 Fatty Acids (FISH OIL PO) Take 1 capsule by mouth 3 (three) times a week.   . Psyllium (METAMUCIL PO) Take 1 Package by mouth daily.  . rosuvastatin (CRESTOR) 10 MG tablet Take 10 mg by mouth daily.    . Telmisartan-Amlodipine (TWYNSTA) 80-5 MG TABS Take by mouth daily.   . Telmisartan-Amlodipine 80-10 MG TABS Take 1 tablet by mouth daily.   Facility-Administered Encounter Medications as of 05/01/2015  Medication  . sodium chloride 0.9 % injection 3 mL    PAST MEDICAL HISTORY:   Past Medical History  Diagnosis Date  . Allergy   . Hypothyroidism   . COPD (chronic obstructive pulmonary disease)   . GERD (gastroesophageal reflux disease)   . Barrett's esophagus   . Hypertension   . Depression   . Arthritis   . History of esophageal stricture   . Atrial fibrillation     2D ECHO, 08/12/2012 - EF >55%, LA moderately dilated, RA moderately dilated, moderate tricuspid valve regurgitation  . Gastroparesis   . Hiatal hernia     PAST SURGICAL HISTORY:   Past Surgical History  Procedure Laterality Date  . Facial cosmetic surgery  2000  . Abdominal hysterectomy  1970  . Cardioversion  09/14/2012    Procedure: CARDIOVERSION;  Surgeon: Pixie Casino, MD;  Location: [facility] ENDOSCOPY;  Service: Cardiovascular;  Laterality: N/A;  .  Cataract extraction Bilateral 06/2014-08/2014    SOCIAL HISTORY:   History   Social History  . Marital Status: Widowed    Spouse Name: N/A  . Number of Children: N/A  . Years of Education: N/A   Occupational History  . Retired- Clinical cytogeneticist     Social History Main Topics  . Smoking status: Former Smoker -- 0.50 packs/day for 15 years    Types: Cigarettes    Quit date: 11/09/1993  . Smokeless tobacco: Never Used  . Alcohol Use: No  . Drug Use: No  . Sexual Activity: Not on  file   Other Topics Concern  . Not on file   Social History Narrative    FAMILY HISTORY:   Family Status  Relation Status Death Age  . Mother Deceased 79    stroke  . Father Deceased 60    died in sleep  . Brother Alive   . Brother Alive   . Sister Alive   . Sister Alive   . Son Deceased     pneumonia    ROS:  A complete 10 system review of systems was obtained and was unremarkable apart from what is mentioned above.  PHYSICAL EXAMINATION:    VITALS:   Filed Vitals:   05/01/15 1251  BP: 122/72  Pulse: 116  Height: 5\' 6"  (1.676 m)  Weight: 275 lb (124.739 kg)    GEN:  The patient appears stated age and is in NAD. HEENT:  Normocephalic, atraumatic.  The mucous membranes are moist. The superficial temporal arteries are without ropiness or tenderness. CV:  Irreg irreg Lungs:  CTAB.  I had her sitting her ABCs and she caused at the letter "n" for her breath.  I had her do this on a second occasion and she caused at the letter "p" to get her breath. Neck/HEME:  There are no carotid bruits bilaterally.  Neurological examination:  Orientation:  Montreal Cognitive Assessment  05/01/2015  Visuospatial/ Executive (0/5) 4  Naming (0/3) 2  Attention: Read list of digits (0/2) 1  Attention: Read list of letters (0/1) 1  Attention: Serial 7 subtraction starting at 100 (0/3) 3  Language: Repeat phrase (0/2) 2  Language : Fluency (0/1) 1  Abstraction (0/2) 0  Delayed Recall (0/5) 5    Orientation (0/6) 6  Total 25  Adjusted Score (based on education) 26    Cranial nerves: There is good facial symmetry.  There is rare and intermittent right hemifacial spasm.  Pupils are equal round and reactive to light bilaterally. Fundoscopic exam reveals clear margins bilaterally. Extraocular muscles are intact.  She does have horizontal nystagmus to the left and right.  There is no vertical nystagmus.  The visual fields are full to confrontational testing.  She has no difficulty with prolonged upgaze.  The speech is fluent and clear.  There is vocal tremor.  She has no difficulty with the guttural sounds.  Soft palate rises symmetrically and there is no tongue deviation. Hearing is intact to conversational tone. Sensation: Sensation is intact to light and pinprick throughout (facial, trunk, extremities). Vibration is decreased at the bilateral big toe and ankle. There is no extinction with double simultaneous stimulation. There is no sensory dermatomal level identified. Motor: Strength is 5/5 in the bilateral upper and lower extremities.   Shoulder shrug is equal and symmetric.  There is no pronator drift. Deep tendon reflexes: Deep tendon reflexes are 2-2+/4 at the bilateral biceps, triceps, brachioradialis, patella and trace at the bilateral achilles. Plantar responses are downgoing bilaterally.  Movement examination: Tone: There is normal tone in the bilateral upper extremities.  The tone in the lower extremities is normal.  Abnormal movements: There is tremor of the outstretched hands that increases with intention.  There is a slight rest component to the tremor.  She has difficulty with Archimedes spirals, left greater than right. Coordination:  There is no significant decremation with RAM's, with any form of rapid alternating movements, including alternating supination and pronation of the forearm, hand opening and closing, finger taps, heel taps and toe taps. Gait and Station: The  patient has minimal difficulty arising out of a deep-seated chair without the use of the hands. The patient's stride length is normal, but she is mildly unsteady.   The patient probably lab work from her primary care provider.  Her most recent labs are dated 03/30/2015.  Her CBC was normal with a white blood cell count of 7.7, hemoglobin 12.6, hematocrit 39.4 and platelets 300.  Her sodium was 138, potassium 5.3, chloride 101, CO2 27, BUN 14 and creatinine 1.2.  Her preceding creatinine values were 1.5 and 1.6 respectively.  Her AST was 20, ALT 25 and alkaline phosphatase 90.  Her TSH on 03/30/2015 was 0.040    ASSESSMENT/PLAN:  1.  Essential tremor  -She has a very long-standing history of tremor that has slowly gotten worse throughout the years.  It is symmetric.  I told her that it was not uncommon for essential tremor to involved the voice as time goes on.  This really is not particularly bothersome to the patient.  She wants no treatment.  We did talk about various treatments, however.  We talked about pathophysiology.  -Her TSH was very low and her Synthroid dose was recently decreased.  This could, in theory, contribute to some degree of tremor as well.  I do not think this is likely a big issue. 2.  Mild right hemifacial spasm  -It is rare, but it is definitely present.  I will do an MRI of the brain to make sure I'm not missing anything.  I explained to the patient that she most certainly will have small vessel disease, but I want to make sure that we are not missing anything more serious. 3.  Weakness and shortness of breath  -I told the patient that I am not completely convinced that this is a primary neurologic etiology, but we will certainly look for neurologic etiologies.  I would check for acetylcholine receptor antibodies.  I'll also do lab work to include CPK/aldolase (on statin and rule out statin-induced myopathy).    -She has some evidence of a peripheral neuropathy, and while that  could explain some gait instability, it really does not explain the shortness of breath.  I will do lab work to look at reversible causes, including hemoglobin A1c (prior labs with mild hyperglycemia), B12, folate, RPR, SPEP/UPEP with immunofixation.  -She will have an EMG. 4.  Much greater than 50% of this visit was spent in counseling/coordinating care with the patient.  Total face to face time:  60 min   Thank you, Dr. Debara Pickett, for allowing me to participate in the care of your patient.  Feel free to contact me with questions/concerns.

## 2015-05-01 NOTE — Progress Notes (Signed)
Note routed to Dr Wilson Singer.

## 2015-05-01 NOTE — Patient Instructions (Signed)
1. Your provider has requested that you have labwork completed today. Please go to [facility] on the first floor of this building before leaving the office today. 2. We have scheduled you at [facility] for your MRI on 05/15/2015 at 1:00 pm. Please arrive 15 minutes prior and go to 1st floor radiology. If you need to reschedule for any reason please call [phone]. 3. We will call you to schedule your EMG.

## 2015-05-02 LAB — FOLATE: Folate: >20 ng/mL

## 2015-05-02 LAB — HEMOGLOBIN A1C
Hgb A1c MFr Bld: 5.9 % — ABNORMAL HIGH (ref <5.7)
Mean Plasma Glucose: 123 mg/dL — ABNORMAL HIGH (ref <117)

## 2015-05-02 LAB — VITAMIN B12: Vitamin B-12: 521 pg/mL (ref 211–911)

## 2015-05-02 LAB — RPR

## 2015-05-03 LAB — SPEP & IFE WITH QIG
ALPHA-2-GLOBULIN: 1 g/dL — AB (ref 0.5–0.9)
Albumin ELP: 3.7 g/dL — ABNORMAL LOW (ref 3.8–4.8)
Alpha-1-Globulin: 0.3 g/dL (ref 0.2–0.3)
BETA GLOBULIN: 0.5 g/dL (ref 0.4–0.6)
Beta 2: 0.4 g/dL (ref 0.2–0.5)
GAMMA GLOBULIN: 0.8 g/dL (ref 0.8–1.7)
IGA: 207 mg/dL (ref 69–380)
IgG (Immunoglobin G), Serum: 878 mg/dL (ref 690–1700)
IgM, Serum: 109 mg/dL (ref 52–322)
TOTAL PROTEIN, SERUM ELECTROPHOR: 6.6 g/dL (ref 6.1–8.1)

## 2015-05-03 LAB — UIFE/LIGHT CHAINS/TP QN, 24-HR UR: Albumin, U: DETECTED

## 2015-05-04 LAB — ALDOLASE: Aldolase: 5.4 U/L (ref <=8.1)

## 2015-05-09 LAB — MYASTHENIA GRAVIS PANEL 2
ACETYLCHOLINE REC MOD AB: 18 %{inhibition}
Acetylcholine Rec Binding: <0.3 nmol/L
Aceytlcholine Rec Bloc Ab: <15 % of inhibition (ref <15)

## 2015-05-15 ENCOUNTER — Telehealth: Payer: Self-pay | Admitting: Neurology

## 2015-05-15 ENCOUNTER — Encounter: Payer: Self-pay | Admitting: Gastroenterology

## 2015-05-15 ENCOUNTER — Ambulatory Visit (HOSPITAL_COMMUNITY)
Admission: RE | Admit: 2015-05-15 | Discharge: 2015-05-15 | Disposition: A | Discharge status: Home / Self Care | Payer: Medicare Other | Source: Ambulatory Visit | Attending: Neurology | Admitting: Neurology

## 2015-05-15 ENCOUNTER — Other Ambulatory Visit: Payer: Self-pay | Admitting: Neurology

## 2015-05-15 DIAGNOSIS — M791 Myalgia, unspecified site: Secondary | ICD-10-CM

## 2015-05-15 DIAGNOSIS — G5139 Clonic hemifacial spasm, unspecified: Secondary | ICD-10-CM

## 2015-05-15 DIAGNOSIS — R251 Tremor, unspecified: Secondary | ICD-10-CM | POA: Diagnosis not present

## 2015-05-15 DIAGNOSIS — R531 Weakness: Secondary | ICD-10-CM | POA: Diagnosis not present

## 2015-05-15 DIAGNOSIS — G513 Clonic hemifacial spasm: Secondary | ICD-10-CM | POA: Diagnosis not present

## 2015-05-15 DIAGNOSIS — G319 Degenerative disease of nervous system, unspecified: Secondary | ICD-10-CM | POA: Diagnosis not present

## 2015-05-15 DIAGNOSIS — W19XXXA Unspecified fall, initial encounter: Secondary | ICD-10-CM

## 2015-05-15 DIAGNOSIS — R443 Hallucinations, unspecified: Secondary | ICD-10-CM | POA: Diagnosis not present

## 2015-05-15 LAB — CREATININE, SERUM
Creatinine, Ser: 2.09 mg/dL — ABNORMAL HIGH (ref 0.44–1.00)
GFR calc non Af Amer: 22 mL/min — ABNORMAL LOW (ref >60)
GFR, EST AFRICAN AMERICAN: 26 mL/min — AB (ref >60)

## 2015-05-15 NOTE — Telephone Encounter (Signed)
-----   Message from [place], DO sent at 05/15/2015  4:11 PM EDT ----- Luvenia Starch, please call pt.  I think that she probably had the Cr prior to her MRI.  It was very normal a month ago but it is significantly elevated now.  I wonder if this is an Scientist, research (medical) and is wrong?  In any case, please tell pt she needs appt soon (?tomorrow) with PCP to discuss.  Send them a copy of her cr and see if you can get her an appt.  Likely will redraw

## 2015-05-15 NOTE — Telephone Encounter (Signed)
Patient made aware. Appt made with Dr Wilson Singer for 05/16/2015 at 1:30 pm. Lab results faxed to Dr Wilson Singer at [phone] with confirmation received. Patient aware of appt.

## 2015-05-17 ENCOUNTER — Telehealth: Payer: Self-pay | Admitting: Neurology

## 2015-05-17 DIAGNOSIS — R0689 Other abnormalities of breathing: Secondary | ICD-10-CM | POA: Diagnosis not present

## 2015-05-17 DIAGNOSIS — E875 Hyperkalemia: Secondary | ICD-10-CM | POA: Diagnosis not present

## 2015-05-17 DIAGNOSIS — D751 Secondary polycythemia: Secondary | ICD-10-CM | POA: Diagnosis not present

## 2015-05-17 DIAGNOSIS — I10 Essential (primary) hypertension: Secondary | ICD-10-CM | POA: Diagnosis not present

## 2015-05-17 NOTE — Telephone Encounter (Signed)
Note routed (faxed) to Dr Wilson Singer.

## 2015-05-17 NOTE — Telephone Encounter (Signed)
Marita Snellen called from Dr Eugenio Hoes office and needed office notes faxed over to them for this pt/Dawn Ph:[phone] Fax:[phone]

## 2015-05-22 DIAGNOSIS — E875 Hyperkalemia: Secondary | ICD-10-CM | POA: Diagnosis not present

## 2015-05-23 DIAGNOSIS — I10 Essential (primary) hypertension: Secondary | ICD-10-CM | POA: Diagnosis not present

## 2015-05-23 DIAGNOSIS — E875 Hyperkalemia: Secondary | ICD-10-CM | POA: Diagnosis not present

## 2015-05-23 DIAGNOSIS — E039 Hypothyroidism, unspecified: Secondary | ICD-10-CM | POA: Diagnosis not present

## 2015-05-23 DIAGNOSIS — T50905A Adverse effect of unspecified drugs, medicaments and biological substances, initial encounter: Secondary | ICD-10-CM | POA: Diagnosis not present

## 2015-05-24 ENCOUNTER — Telehealth: Payer: Self-pay | Admitting: *Deleted

## 2015-05-24 ENCOUNTER — Encounter: Payer: Self-pay | Admitting: Neurology

## 2015-05-24 NOTE — Telephone Encounter (Signed)
Patient requesting a copy of her MRI done on 6/6  Call back number [phone]

## 2015-05-24 NOTE — Telephone Encounter (Signed)
Copy of MR mailed to patient per her request.

## 2015-05-31 ENCOUNTER — Ambulatory Visit (INDEPENDENT_AMBULATORY_CARE_PROVIDER_SITE_OTHER): Payer: Medicare Other | Admitting: Internal Medicine

## 2015-05-31 ENCOUNTER — Encounter: Payer: Self-pay | Admitting: Internal Medicine

## 2015-05-31 VITALS — BP 118/62 | HR 113 | Ht 65.0 in | Wt 275.0 lb

## 2015-05-31 DIAGNOSIS — R0600 Dyspnea, unspecified: Secondary | ICD-10-CM

## 2015-05-31 DIAGNOSIS — R0609 Other forms of dyspnea: Secondary | ICD-10-CM

## 2015-05-31 LAB — PULMONARY FUNCTION TEST
DL/VA % pred: 80 %
DL/VA: 3.97 ml/min/mmHg/L
DLCO UNC % PRED: 61 %
DLCO UNC: 15.71 ml/min/mmHg
FEF 25-75 POST: 1.82 L/s
FEF 25-75 PRE: 1.26 L/s
FEF2575-%Change-Post: 43 %
FEF2575-%PRED-PRE: 73 %
FEF2575-%Pred-Post: 105 %
FEV1-%Change-Post: 10 %
FEV1-%PRED-POST: 89 %
FEV1-%PRED-PRE: 80 %
FEV1-POST: 1.98 L
FEV1-Pre: 1.78 L
FEV1FVC-%Change-Post: 11 %
FEV1FVC-%Pred-Pre: 99 %
FEV6-%Change-Post: 0 %
FEV6-%Pred-Post: 84 %
FEV6-%Pred-Pre: 84 %
FEV6-Post: 2.36 L
FEV6-Pre: 2.37 L
FEV6FVC-%CHANGE-POST: 0 %
FEV6FVC-%Pred-Post: 105 %
FEV6FVC-%Pred-Pre: 104 %
FVC-%Change-Post: 0 %
FVC-%PRED-POST: 80 %
FVC-%Pred-Pre: 80 %
FVC-PRE: 2.38 L
FVC-Post: 2.36 L
Post FEV1/FVC ratio: 84 %
Post FEV6/FVC ratio: 100 %
Pre FEV1/FVC ratio: 75 %
Pre FEV6/FVC Ratio: 99 %
RV % pred: 67 %
RV: 1.57 L
TLC % pred: 74 %
TLC: 3.87 L

## 2015-05-31 NOTE — Progress Notes (Signed)
Subjective:    Patient ID: Amber Wu, female    DOB: 1941/04/25, MRN: [id number]    Brief patient profile:  37 yowf quit smoking around 1996 @  Wt < 180 referred by Dr Wilson Singer to pulmonary clinic with progressive doe x 2014 at wt 280 on arrival to pulmonary clinic 04/18/2015 with no airflow obst on pfts 05/31/15     History of Present Illness  04/18/2015 1st [facility] office visit/ [name]   Chief Complaint  Patient presents with  . Pulmonary Consult    Referred by Dr. Wilson Singer. Pt c/o SOB for the past 2 yrs- progressively getting worse. She states she was out of breath walking from parking lot to our building today. She gets SOB when grocery shopping "unless I push a cart". She c/o cough for the past year, which she states is also getting worse- prod with clear to yellow sputum. Her voice has changed over the years.   doe indolent onset x 2 years when  wt 240  And progressively worse since onset proportionate to wt gain - no better with inhalers -   And variably assoc with sporadic min productive cough and variable voice loss/tremor- cough worse at hs / ? Tremor related to thyroid rx (not familial) vs Parkinsons per notes - denies choking on food or dsyphagia rec Dexilant Take 30-60 min before first meal of the day and add pepcid 20 mg at bedtime GERD diet   05/31/2015 f/u ov/Lynisha Osuch re:  F/u doe / pfts show no significant airflow obst at [place] Complaint  Patient presents with  . Follow-up    PFT done today.  Cough and SOB are unchanged since her last visit. No new co's today.   every single time has spell of sob it occurs  with activity eg across a parking lot assoc with nausea sometimes  Does stationery bike does x 15 min s nausea but still get sob   Cough is more talking or eating / never noct or early  am   No obvious day to day or daytime variabilty or assoc  cp or chest tightness, subjective wheeze overt sinus or hb symptoms. No unusual exp hx or h/o childhood pna/ asthma  or knowledge of premature birth.  Sleeping ok without nocturnal  or early am exacerbation  of respiratory  c/o's or need for noct saba. Also denies any obvious fluctuation of symptoms with weather or environmental changes or other aggravating or alleviating factors except as outlined above   Current Medications, Allergies, Complete Past Medical History, Past Surgical History, Family History, and Social History were reviewed in [employer] record.  ROS  The following are not active complaints unless bolded sore throat, dysphagia, dental problems, itching, sneezing,  nasal congestion or excess/ purulent secretions, ear ache,   fever, chills, sweats, unintended wt loss, pleuritic or exertional cp, hemoptysis,  orthopnea pnd or leg swelling, presyncope, palpitations, abdominal pain, anorexia, nausea, vomiting, diarrhea  or change in bowel or urinary habits, change in stools or urine, dysuria,hematuria,  rash, arthralgias, visual complaints, headache, numbness weakness or ataxia or problems with walking or coordination,  change in mood/affect or memory.            Objective:   Physical Exam  amb obese wf with halting voice pattern  05/31/2015       275  Wt Readings from Last 3 Encounters:  04/18/15 280 lb 3.2 oz (127.098 kg)  03/20/15 279 lb (126.554 kg)  09/13/14 279 lb (126.554 kg)    Vital signs reviewed   HEENT: nl dentition, turbinates, and orophanx. Nl external ear canals without cough reflex   NECK :  without JVD/Nodes/TM/ nl carotid upstrokes bilaterally   LUNGS: no acc muscle use, clear to A and P bilaterally without cough on insp or exp maneuvers   CV:  RRR  no s3 or murmur or increase in P2, no edema   ABD:  soft and nontender with nl excursion in the supine position. No bruits or organomegaly, bowel sounds nl  MS:  warm without deformities, calf tenderness, cyanosis or clubbing  SKIN: warm and dry without lesions    NEURO:  alert, approp, no  deficits     cxr report 04/06/15 Chronic bronchitic changes only      Lab Results  Component Value Date   TSH 0.087* 03/22/2015        Assessment & Plan:

## 2015-05-31 NOTE — Progress Notes (Signed)
PFT done today. 

## 2015-05-31 NOTE — Patient Instructions (Signed)
You do not have a lung problem other that what we would expect from your weight gain   I will Dr Debara Pickett and Wilson Singer know this   If you have no early am problem with coughing or breathing when you wake up then there's no need for night time pepcid.

## 2015-06-02 ENCOUNTER — Encounter: Payer: Self-pay | Admitting: Internal Medicine

## 2015-06-02 ENCOUNTER — Telehealth: Payer: Self-pay | Admitting: Internal Medicine

## 2015-06-04 ENCOUNTER — Encounter: Payer: Self-pay | Admitting: Internal Medicine

## 2015-06-04 NOTE — Assessment & Plan Note (Addendum)
04/18/2015   Walked RA x one lap @ 185 stopped due to  Sob plus pain in legs/ slow pace, no desat - pfts 05/31/15  FEV1 1.98 (89%)  84 ratio and no change p Rx and DLCO 61% corrcts to 80% for volume   I had an extended final summary discussion with the patient reviewing all relevant studies completed to date and  lasting 15 to 20 minutes of a 25 minute visit on the following issues:    1) no evidence of a pulmonary cause for her complaints - does not appear to have copd or any other airways dz   2) I worry about upper airway instability (vcd) with LPR related to her wt gain (so would continue GERD rx for now) and asp related to her tremor ? Underlying parkinson's dz but nothing more to add at this point  3) Each maintenance medication was reviewed in detail including most importantly the difference between maintenance and as needed and under what circumstances the prns are to be used.  Please see instructions for details which were reviewed in writing and the patient given a copy.    4) pulmonary f/u prn

## 2015-06-04 NOTE — Assessment & Plan Note (Signed)
Body mass index is 45.76 kg/(m^2).  Lab Results  Component Value Date   TSH 0.087* 03/22/2015     Contributing to gerd tendency/ doe/ needs to achieve and maintain neg calorie balance >f/u primary care

## 2015-06-05 NOTE — Telephone Encounter (Signed)
Close encounter 

## 2015-06-08 DIAGNOSIS — I10 Essential (primary) hypertension: Secondary | ICD-10-CM | POA: Diagnosis not present

## 2015-06-14 DIAGNOSIS — R0689 Other abnormalities of breathing: Secondary | ICD-10-CM | POA: Diagnosis not present

## 2015-06-14 DIAGNOSIS — E875 Hyperkalemia: Secondary | ICD-10-CM | POA: Diagnosis not present

## 2015-06-14 DIAGNOSIS — I739 Peripheral vascular disease, unspecified: Secondary | ICD-10-CM | POA: Diagnosis not present

## 2015-06-16 DIAGNOSIS — R0689 Other abnormalities of breathing: Secondary | ICD-10-CM | POA: Diagnosis not present

## 2015-06-22 ENCOUNTER — Encounter (HOSPITAL_COMMUNITY): Payer: Self-pay | Admitting: Vascular Surgery

## 2015-06-22 ENCOUNTER — Emergency Department (HOSPITAL_COMMUNITY): Payer: Medicare Other

## 2015-06-22 ENCOUNTER — Emergency Department (HOSPITAL_COMMUNITY)
Admission: EM | Admit: 2015-06-22 | Discharge: 2015-06-22 | Disposition: A | Discharge status: Home / Self Care | Payer: Medicare Other | Attending: Emergency Medicine | Admitting: Emergency Medicine

## 2015-06-22 DIAGNOSIS — Z87891 Personal history of nicotine dependence: Secondary | ICD-10-CM | POA: Insufficient documentation

## 2015-06-22 DIAGNOSIS — Y9301 Activity, walking, marching and hiking: Secondary | ICD-10-CM | POA: Insufficient documentation

## 2015-06-22 DIAGNOSIS — I4891 Unspecified atrial fibrillation: Secondary | ICD-10-CM | POA: Insufficient documentation

## 2015-06-22 DIAGNOSIS — M79674 Pain in right toe(s): Secondary | ICD-10-CM | POA: Diagnosis not present

## 2015-06-22 DIAGNOSIS — S99921A Unspecified injury of right foot, initial encounter: Secondary | ICD-10-CM | POA: Insufficient documentation

## 2015-06-22 DIAGNOSIS — Y998 Other external cause status: Secondary | ICD-10-CM | POA: Insufficient documentation

## 2015-06-22 DIAGNOSIS — J449 Chronic obstructive pulmonary disease, unspecified: Secondary | ICD-10-CM | POA: Insufficient documentation

## 2015-06-22 DIAGNOSIS — S0990XA Unspecified injury of head, initial encounter: Secondary | ICD-10-CM | POA: Diagnosis not present

## 2015-06-22 DIAGNOSIS — S299XXA Unspecified injury of thorax, initial encounter: Secondary | ICD-10-CM | POA: Diagnosis not present

## 2015-06-22 DIAGNOSIS — E669 Obesity, unspecified: Secondary | ICD-10-CM | POA: Insufficient documentation

## 2015-06-22 DIAGNOSIS — Z79899 Other long term (current) drug therapy: Secondary | ICD-10-CM | POA: Insufficient documentation

## 2015-06-22 DIAGNOSIS — Y92003 Bedroom of unspecified non-institutional (private) residence as the place of occurrence of the external cause: Secondary | ICD-10-CM | POA: Diagnosis not present

## 2015-06-22 DIAGNOSIS — K219 Gastro-esophageal reflux disease without esophagitis: Secondary | ICD-10-CM | POA: Insufficient documentation

## 2015-06-22 DIAGNOSIS — Z7982 Long term (current) use of aspirin: Secondary | ICD-10-CM | POA: Insufficient documentation

## 2015-06-22 DIAGNOSIS — I10 Essential (primary) hypertension: Secondary | ICD-10-CM | POA: Insufficient documentation

## 2015-06-22 DIAGNOSIS — W01198A Fall on same level from slipping, tripping and stumbling with subsequent striking against other object, initial encounter: Secondary | ICD-10-CM | POA: Diagnosis not present

## 2015-06-22 DIAGNOSIS — Z7901 Long term (current) use of anticoagulants: Secondary | ICD-10-CM | POA: Diagnosis not present

## 2015-06-22 DIAGNOSIS — S42291A Other displaced fracture of upper end of right humerus, initial encounter for closed fracture: Secondary | ICD-10-CM | POA: Insufficient documentation

## 2015-06-22 DIAGNOSIS — S4991XA Unspecified injury of right shoulder and upper arm, initial encounter: Secondary | ICD-10-CM | POA: Diagnosis present

## 2015-06-22 DIAGNOSIS — M1990 Unspecified osteoarthritis, unspecified site: Secondary | ICD-10-CM | POA: Insufficient documentation

## 2015-06-22 DIAGNOSIS — M25511 Pain in right shoulder: Secondary | ICD-10-CM | POA: Diagnosis not present

## 2015-06-22 DIAGNOSIS — T148 Other injury of unspecified body region: Secondary | ICD-10-CM | POA: Diagnosis not present

## 2015-06-22 DIAGNOSIS — F329 Major depressive disorder, single episode, unspecified: Secondary | ICD-10-CM | POA: Diagnosis not present

## 2015-06-22 DIAGNOSIS — S42201A Unspecified fracture of upper end of right humerus, initial encounter for closed fracture: Secondary | ICD-10-CM | POA: Diagnosis not present

## 2015-06-22 DIAGNOSIS — E039 Hypothyroidism, unspecified: Secondary | ICD-10-CM | POA: Diagnosis not present

## 2015-06-22 DIAGNOSIS — S199XXA Unspecified injury of neck, initial encounter: Secondary | ICD-10-CM | POA: Diagnosis not present

## 2015-06-22 DIAGNOSIS — R52 Pain, unspecified: Secondary | ICD-10-CM

## 2015-06-22 MED ORDER — ONDANSETRON HCL 4 MG/2ML IJ SOLN
4.0000 mg | Freq: Once | INTRAMUSCULAR | Status: AC
  Administered 2015-06-22: 4 mg via INTRAVENOUS
  Filled 2015-06-22: qty 2

## 2015-06-22 MED ORDER — MORPHINE SULFATE 4 MG/ML IJ SOLN
4.0000 mg | Freq: Once | INTRAMUSCULAR | Status: AC
  Administered 2015-06-22: 4 mg via INTRAVENOUS
  Filled 2015-06-22: qty 1

## 2015-06-22 MED ORDER — OXYCODONE-ACETAMINOPHEN 5-325 MG PO TABS: ORAL_TABLET | ORAL | Status: DC

## 2015-06-22 MED ORDER — OXYCODONE-ACETAMINOPHEN 5-325 MG PO TABS
1.0000 | ORAL_TABLET | Freq: Once | ORAL | Status: AC
  Administered 2015-06-22: 1 via ORAL
  Filled 2015-06-22: qty 1

## 2015-06-22 NOTE — ED Notes (Signed)
Pt arrives from home via [place]. Pt states she tripped and fell today. Pt states she did hit her head during the fall, but denies head/neck/back pain or LOC. Pt endorses right shoulder and right 2nd toe pain. Pt took 2 hydrocodone before EMS arrival. Pt has 2-3 word dyspnea at baseline. Pt is on blood thinners.

## 2015-06-22 NOTE — ED Provider Notes (Signed)
CSN: [id number]     Arrival date & time 06/22/15  1715 History   First MD Initiated Contact with Patient 06/22/15 1718     Chief Complaint  Patient presents with  . Fall     (Consider location/radiation/quality/duration/timing/severity/associated sxs/prior Treatment) HPI  Blood pressure 116/41, pulse 86, temperature 97.6 F (36.4 C), temperature source Oral, resp. rate 16, SpO2 96 %.  Amber Wu is a 74 y.o. female complaining of severe pain to right shoulder status post mechanical slip and fall this morning. Patient was walking into her bedroom from the shower, she forgot she had a suitcase on the floor and tripped over it. She broke the fall with her right shoulder her head did hit the ground as well, there was no loss of consciousness, no nausea vomiting change in vision. She is anticoagulated with pradaxa secondary to A. fib. She denies cervicalgia, chest pain, shortness of breath, difficulty ambulating. She states the pain in the right shoulder is the worst, 10 out of 10 and she took 2 Vicodin before coming in and that alleviated some of the pain. She cannot abduct the shoulder. She also states she has a mild pain in the right second toe. She is right-hand dominant.   Past Medical History  Diagnosis Date  . Allergy   . Hypothyroidism   . COPD (chronic obstructive pulmonary disease)   . GERD (gastroesophageal reflux disease)   . Barrett's esophagus   . Hypertension   . Depression   . Arthritis   . History of esophageal stricture   . Atrial fibrillation     2D ECHO, 08/12/2012 - EF >55%, LA moderately dilated, RA moderately dilated, moderate tricuspid valve regurgitation  . Gastroparesis   . Hiatal hernia    Past Surgical History  Procedure Laterality Date  . Facial cosmetic surgery  2000  . Abdominal hysterectomy  1970  . Cardioversion  09/14/2012    Procedure: CARDIOVERSION;  Surgeon: Pixie Casino, MD;  Location: [facility] ENDOSCOPY;  Service: Cardiovascular;  Laterality:  N/A;  . Cataract extraction Bilateral 06/2014-08/2014   Family History  Problem Relation Age of Onset  . Colon cancer Neg Hx    History  Substance Use Topics  . Smoking status: Former Smoker -- 0.50 packs/day for 15 years    Types: Cigarettes    Quit date: 11/09/1993  . Smokeless tobacco: Never Used  . Alcohol Use: No   OB History    No data available     Review of Systems  10 systems reviewed and found to be negative, except as noted in the HPI.  Allergies  Antihistamines, chlorpheniramine-type  Home Medications   Prior to Admission medications   Medication Sig Start Date End Date Taking? Authorizing Provider  allopurinol (ZYLOPRIM) 100 MG tablet Take 100 mg by mouth 2 (two) times daily.     Historical Provider, MD  aspirin 81 MG tablet Take 81 mg by mouth daily.      Historical Provider, MD  Cholecalciferol (VITAMIN D-3) 1000 UNITS CAPS Take 1,000 Units by mouth daily.    Historical Provider, MD  citalopram (CELEXA) 20 MG tablet Take 20 mg by mouth daily.    Historical Provider, MD  colchicine 0.6 MG tablet Take 0.6 mg by mouth daily.    Historical Provider, MD  dabigatran (PRADAXA) 75 MG CAPS Take 150 mg by mouth every 12 (twelve) hours.     Historical Provider, MD  dexlansoprazole (DEXILANT) 60 MG capsule Take 60 mg by mouth daily.  Historical Provider, MD  famotidine (PEPCID) 20 MG tablet Take 20 mg by mouth at bedtime.    Historical Provider, MD  furosemide (LASIX) 20 MG tablet Take 1 tablet (20 mg total) by mouth daily as needed. 04/20/15   Pixie Casino, MD  HYDROcodone-acetaminophen (LORTAB) 7.5-500 MG per tablet Take 1 tablet by mouth as needed for pain.    Historical Provider, MD  ibuprofen (ADVIL,MOTRIN) 200 MG tablet Take 200 mg by mouth every 6 (six) hours as needed.    Historical Provider, MD  levothyroxine (SYNTHROID, LEVOTHROID) 175 MCG tablet Take 150 mcg by mouth daily before breakfast.     Historical Provider, MD  metoprolol succinate (TOPROL-XL) 25 MG  24 hr tablet Take 25 mg by mouth 2 (two) times daily.     Historical Provider, MD  Multiple Vitamin (MULTIVITAMIN) tablet Take 1 tablet by mouth daily.    Historical Provider, MD  Naproxen Sodium (ALEVE) 220 MG CAPS Take by mouth as needed.    Historical Provider, MD  Psyllium (METAMUCIL PO) Take 1 Package by mouth daily.    Historical Provider, MD  rosuvastatin (CRESTOR) 10 MG tablet Take 10 mg by mouth daily.      Historical Provider, MD   BP 127/76 mmHg  Pulse 55  Temp(Src) 98.1 F (36.7 C) (Oral)  Resp 16  SpO2 92% Physical Exam  Constitutional: She is oriented to person, place, and time. She appears well-developed and well-nourished.  Obese  HENT:  Head: Normocephalic and atraumatic.  Mouth/Throat: Oropharynx is clear and moist.  No abrasions or contusions.   No hemotympanum, battle signs or raccoon's eyes  No crepitance or tenderness to palpation along the orbital rim.  EOMI intact with no pain or diplopia  No abnormal otorrhea or rhinorrhea. Nasal septum midline.  No intraoral trauma.  Eyes: Conjunctivae and EOM are normal. Pupils are equal, round, and reactive to light.  Neck: Normal range of motion. Neck supple.  No midline C-spine  tenderness to palpation or step-offs appreciated. Patient has full range of motion without pain.  Grip/Biceps/Tricep strength 5/5 bilaterally, sensation to UE intact bilaterally.    Cardiovascular: Normal rate, regular rhythm and intact distal pulses.   Pulmonary/Chest: Effort normal and breath sounds normal. No respiratory distress. She has no wheezes. She has no rales. She exhibits no tenderness.  No  TTP or crepitance  Patient is on oxygen at baseline, she is resting comfortably in a supine position, speaking in complete sentences.  Abdominal: Soft. Bowel sounds are normal. She exhibits no distension and no mass. There is no tenderness. There is no rebound and no guarding.  No Seatbelt Sign  Musculoskeletal: Normal range of motion.  She exhibits no edema or tenderness.  Patient cannot abduct right shoulder, she cannot fully extend the right wrist. There is no snuffbox tenderness palpation bilaterally, she has excellent range of motion to all fingers with brisk cap refill. No deformity to right second toe, mildly tender to palpation.   Neurological: She is alert and oriented to person, place, and time.  MAE  Distal sensation intact  Skin: Skin is warm.  Psychiatric: She has a normal mood and affect.  Nursing note and vitals reviewed.   ED Course  Procedures (including critical care time) Labs Review Labs Reviewed - No data to display  Imaging Review Dg Chest 2 View  06/22/2015   CLINICAL DATA:  74 year old female status post fall  EXAM: CHEST  2 VIEW  COMPARISON:  Radiograph dated 04/06/2015  FINDINGS: The  heart size and mediastinal contours are within normal limits. Both lungs are clear. The visualized skeletal structures are unremarkable.  IMPRESSION: No active cardiopulmonary disease.   Electronically Signed   By: Anner Crete M.D.   On: 06/22/2015 18:48   Dg Shoulder Right  06/22/2015   CLINICAL DATA:  Patient tripped and fell over luggage  EXAM: RIGHT SHOULDER - 2+ VIEW  COMPARISON:  None.  FINDINGS: Frontal and Y scapular images obtained. There is a comminuted fracture of the proximal humeral metaphysis. On the Y scapular view, there is evidence of displacement of fracture fragments ; this degree of displacement is not appreciable on the frontal view. No dislocation. There is mild generalized osteoarthritic change.  IMPRESSION: Comminuted displaced fracture of the proximal humeral metaphysis. No dislocation.   Electronically Signed   By: Lowella Grip III M.D.   On: 06/22/2015 18:49   Ct Head Wo Contrast  06/22/2015   CLINICAL DATA:  74 year old female status post trauma stop  EXAM: CT HEAD WITHOUT CONTRAST  CT CERVICAL SPINE WITHOUT CONTRAST  TECHNIQUE: Multidetector CT imaging of the head and cervical  spine was performed following the standard protocol without intravenous contrast. Multiplanar CT image reconstructions of the cervical spine were also generated.  COMPARISON:  None  FINDINGS: CT HEAD FINDINGS  Evaluation is limited due to streak artifact caused by metallic dental work.  The ventricles and sulci are appropriate in size for patient's age. Mild periventricular and deep white matter hypodensities represent chronic microvascular ischemic changes. There is no intracranial hemorrhage. No mass effect or midline shift identified.  The visualized paranasal sinuses and mastoid air cells are well aerated. The calvarium is intact.  CT CERVICAL SPINE FINDINGS  No acute fracture or subluxation.There is osteopenia with multilevel degenerative changes with facet hypertrophy.The odontoid and spinous processes are intact.There is normal anatomic alignment of the C1-C2 lateral masses. The visualized soft tissues appear unremarkable.  There is mild emphysematous  IMPRESSION: No acute intracranial pathology  No acute cervical spine fracture.   Electronically Signed   By: Anner Crete M.D.   On: 06/22/2015 18:33   Ct Cervical Spine Wo Contrast  06/22/2015   CLINICAL DATA:  74 year old female status post trauma stop  EXAM: CT HEAD WITHOUT CONTRAST  CT CERVICAL SPINE WITHOUT CONTRAST  TECHNIQUE: Multidetector CT imaging of the head and cervical spine was performed following the standard protocol without intravenous contrast. Multiplanar CT image reconstructions of the cervical spine were also generated.  COMPARISON:  None  FINDINGS: CT HEAD FINDINGS  Evaluation is limited due to streak artifact caused by metallic dental work.  The ventricles and sulci are appropriate in size for patient's age. Mild periventricular and deep white matter hypodensities represent chronic microvascular ischemic changes. There is no intracranial hemorrhage. No mass effect or midline shift identified.  The visualized paranasal sinuses and  mastoid air cells are well aerated. The calvarium is intact.  CT CERVICAL SPINE FINDINGS  No acute fracture or subluxation.There is osteopenia with multilevel degenerative changes with facet hypertrophy.The odontoid and spinous processes are intact.There is normal anatomic alignment of the C1-C2 lateral masses. The visualized soft tissues appear unremarkable.  There is mild emphysematous  IMPRESSION: No acute intracranial pathology  No acute cervical spine fracture.   Electronically Signed   By: Anner Crete M.D.   On: 06/22/2015 18:33   Dg Toe 2nd Right  06/22/2015   CLINICAL DATA:  74 year old female with fall and pain in the right second toe.  EXAM: RIGTH  SECOND TOE  COMPARISON:  None.  FINDINGS: No acute fracture or dislocation. There is degenerative changes of the proximal and distal interphalangeal joints with mild chronic appearing lateral subluxation of the toe at the PIP. The soft tissues are grossly unremarkable.  IMPRESSION: No acute fracture or dislocation.   Electronically Signed   By: Anner Crete M.D.   On: 06/22/2015 18:49     EKG Interpretation None      MDM   Final diagnoses:  Pain  Humeral head fracture, right, closed, initial encounter    Filed Vitals:   06/22/15 1720 06/22/15 1723 06/22/15 1852  BP:  116/41 127/76  Pulse:  86 55  Temp:  97.6 F (36.4 C) 98.1 F (36.7 C)  TempSrc:  Oral   Resp:  16 16  SpO2: 97% 96% 92%    Medications  oxyCODONE-acetaminophen (PERCOCET/ROXICET) 5-325 MG per tablet 1 tablet (not administered)  morphine 4 MG/ML injection 4 mg (4 mg Intravenous Given 06/22/15 1757)  ondansetron (ZOFRAN) injection 4 mg (4 mg Intravenous Given 06/22/15 1756)    Amber Wu is a pleasant 74 y.o. female presenting with severe right shoulder pain status post mechanical fall at home. Patient is anticoagulated and there does appear to be minor head trauma, her neuro exam is nonfocal. C-spine CT are negative. Shoulder x-ray shows a humeral head  fracture. Patient will be placed in a sling, given orthopedic follow-up and counseled on fall precautions.  This is a shared visit with the attending physician who personally evaluated the patient and agrees with the care plan.   Evaluation does not show pathology that would require ongoing emergent intervention or inpatient treatment. Pt is hemodynamically stable and mentating appropriately. Discussed findings and plan with patient/guardian, who agrees with care plan. All questions answered. Return precautions discussed and outpatient follow up given.   New Prescriptions   OXYCODONE-ACETAMINOPHEN (PERCOCET/ROXICET) 5-325 MG PER TABLET    1 to 2 tabs PO q6hrs  PRN for pain         Monico Blitz, PA-C 06/22/15 2101  Blanchie Dessert, MD 06/23/15 2232

## 2015-06-22 NOTE — Progress Notes (Signed)
Orthopedic Tech Progress Note Patient Details:  Amber Wu 1941/09/01 [id number]  Ortho Devices Type of Ortho Device: Arm sling Ortho Device/Splint Location: RUE Ortho Device/Splint Interventions: Ordered, Application   Braulio Bosch 06/22/2015, 7:46 PM

## 2015-06-22 NOTE — Discharge Instructions (Signed)
Take percocet for breakthrough pain, do not drink alcohol, drive, care for children or do other critical tasks while taking percocet.  Please be very careful not to fall! The pain medication and  puts you at risk for falls. Please rest as much as possible and try to not stay alone.   Please follow with your primary care doctor in the next 2 days for a check-up. They must obtain records for further management.   Do not hesitate to return to the Emergency Department for any new, worsening or concerning symptoms.

## 2015-06-27 ENCOUNTER — Encounter: Payer: Self-pay | Admitting: Internal Medicine

## 2015-06-27 DIAGNOSIS — S42221A 2-part displaced fracture of surgical neck of right humerus, initial encounter for closed fracture: Secondary | ICD-10-CM | POA: Diagnosis not present

## 2015-06-27 DIAGNOSIS — M25511 Pain in right shoulder: Secondary | ICD-10-CM | POA: Diagnosis not present

## 2015-06-28 ENCOUNTER — Telehealth: Payer: Self-pay | Admitting: *Deleted

## 2015-06-28 NOTE — Telephone Encounter (Signed)
1. Type of surgery: left shoulder: ORIF proximal humerus  >> "please let us [[facility]] know if there are any contraindications or any recommendations for the patient prior to surgery, during the procedure, or in the post-operative period." 2. Date of surgery: 07/06/2015 3. Surgeon: Dr. Lennette Bihari Supple 4. Medications that need to be held & how long: pradaxa and aspirin 5. Fax and/or Phone: (p) [phone]  (f) [phone]      >> contact info for Pollyann Savoy  * request for our office to contact patient to let her know when to stop pradaxa & aspirin for surgery

## 2015-06-29 NOTE — Telephone Encounter (Signed)
I spoke with MD from [facility] already and sent over clearance. There should be an orders only encounter for this.  Dr. Lemmie Evens

## 2015-06-29 NOTE — Telephone Encounter (Signed)
Clearance letter sent via EPIC Patient to hold pradaxa 3 days prior and aspirin 7 days prior to surgery  Spoke with patient and informed her of MD recommendations regarding holding medications.  She voiced understanding.   She reports she is needing help at home - she lives along and has no children.  She has difficulties get to appointments with with IADLS and ADLS, espcially not since her shoulder is to be operated on.  Advised patient to contact PCP for home health aid and transportation assistance.  Advised patient to discuss with orthopedist about rehab post-op in a facility where she can received nursing care to alleviate stress from needing assistance with things so she can better focus on getting her mobility back. She states she sees Dr. Onnie Graham on 7/25 and will review this with him.

## 2015-07-03 DIAGNOSIS — S42221G 2-part displaced fracture of surgical neck of right humerus, subsequent encounter for fracture with delayed healing: Secondary | ICD-10-CM | POA: Diagnosis not present

## 2015-07-03 DIAGNOSIS — S4991XD Unspecified injury of right shoulder and upper arm, subsequent encounter: Secondary | ICD-10-CM | POA: Diagnosis not present

## 2015-07-04 NOTE — Progress Notes (Signed)
Anesthesia Chart Review: SAME DAY WORK-UP.  Patient is a 74 year old female scheduled for ORIF left proximal humerus fracture on 07/06/15 by Dr. Onnie Graham. Patient fell on 06/22/15 and sustained a comminuted displaced fracture of the proximal humeral metaphysis.   History includes former smoker, GERD, hiatal hernia, gastroparesis, hypothyroidism, COPD, HTN, afib s/p cardioversion, depression, facial cosmetic surgery, hysterectomy, morbid obesity.   PCP is Dr. Wilson Singer. Pulmonologist is Dr. Melvyn Novas, referred for progressive DOE. He did not find any pulmonary cause from her concerns. He did not find evidence of COPD or other airway disease. PRN pulmonary follow-up was recommended. She had also had recent cardiac testing (see below). Neurologist is Dr. Carles Collet.  Cardiologist is Dr. Debara Pickett who felt it was okay to proceed with surgery without further cardiac testing. He gave permission to hold Pradaxa for at least three days prior to the procedure and ASA seven days. Recommend resuming 48 hours after surgery.   Meds include allopurinol, ASA 81mg , Celexa, colchicine, Pradaxa, Dexliant, Pepcid, Lasix, Lortab, ibuprofen, levothyroxine, Toprol, Percocet, Crestor.  04/20/15 EKG: Aflutter with variable AV block. Rate 102 bpm.  03/24/15 Echo: Study Conclusions - Left ventricle: The cavity size was normal. Wall thickness was normal. Systolic function was normal. The estimated ejectionfraction was in the range of 55% to 60%. - Aortic valve: Calcified non coronary cusp. - Mitral valve: Calcified annulus. Mildly thickened leaflets . - Left atrium: The atrium was mildly dilated. - Right ventricle: The cavity size was mildly dilated. - Right atrium: The atrium was mildly dilated. - Atrial septum: No defect or patent foramen ovale was identified.  09/14/14 Nuclear stress test: Overall Impression: Normal stress nuclear study. LV Wall Motion: Not performed secondary to AFIB.  06/22/15 CXR: IMPRESSION: No active cardiopulmonary  disease.  05/31/15 PFTs: FVC 2.38 (80%), FEV1 1.78 (80%), DLCOunc 15.71 (61%).  She will need labs on arrival. A1C was 5.9 on 05/01/15. Her CR was 2.09 on 05/15/15 (up from 0.98 on 03/20/15). Her newly elevated CR was noted by Dr. Carles Collet (done prior to MRI). Dr. Carles Collet had her nurse contact the patient to schedule asap follow-up with her PCP to re-evaluate her renal function (to rule out lab error or new/worsening kidney disease). I will request most recent PCP labs to have available for comparison (no labs done on 06/22/15 during her ED visit). According to Dr. Doristine Devoid 05/01/15 note, previous CR from Dr. Eugenio Hoes office ranged from 1.4-1.6.  She has cardiac clearance, but will need to ensure afib/flutter has good rate control prior to surgery. Will also need to see if renal function is stable/improved.  Further recommendations following anesthesia evaluation on the day of surgery as she is a same day work-up.  [facility] Short Stay Center/Anesthesiology Phone [phone] 07/04/2015 5:25 PM

## 2015-07-05 ENCOUNTER — Encounter (HOSPITAL_COMMUNITY): Payer: Self-pay | Admitting: *Deleted

## 2015-07-05 MED ORDER — LACTATED RINGERS IV SOLN
INTRAVENOUS | Status: DC
  Administered 2015-07-06: 13:00:00 via INTRAVENOUS

## 2015-07-05 MED ORDER — CEFAZOLIN SODIUM 10 G IJ SOLR
3.0000 g | INTRAMUSCULAR | Status: AC
  Administered 2015-07-06: 3 g via INTRAVENOUS
  Filled 2015-07-05 (×2): qty 3000

## 2015-07-05 NOTE — Progress Notes (Signed)
Pt denies any recent chest pain. States she is always sob.

## 2015-07-05 NOTE — Progress Notes (Signed)
Consent order has "left" humerus, pt states it is her "right" humerus. I verified with xray and it is "right" humerus. I called and left message at Dr. Susie Cassette office that a new consent order needs to be put in EPIC.

## 2015-07-05 NOTE — Progress Notes (Signed)
Dr. Susie Cassette office returned my call and stated that a message has been sent to Dr. Onnie Graham about the consent order.

## 2015-07-06 ENCOUNTER — Encounter (HOSPITAL_COMMUNITY)
Admission: RE | Disposition: A | Discharge status: Skilled Nursing Facility | Payer: Self-pay | Source: Ambulatory Visit | Attending: Orthopedic Surgery

## 2015-07-06 ENCOUNTER — Inpatient Hospital Stay (HOSPITAL_COMMUNITY)
Admission: RE | Admit: 2015-07-06 | Discharge: 2015-07-10 | DRG: 493 | Disposition: A | Discharge status: Skilled Nursing Facility | Payer: Medicare Other | Source: Ambulatory Visit | Attending: Orthopedic Surgery | Admitting: Orthopedic Surgery

## 2015-07-06 ENCOUNTER — Ambulatory Visit (HOSPITAL_COMMUNITY): Payer: Medicare Other | Admitting: Vascular Surgery

## 2015-07-06 ENCOUNTER — Encounter (HOSPITAL_COMMUNITY): Payer: Self-pay | Admitting: General Practice

## 2015-07-06 ENCOUNTER — Ambulatory Visit (HOSPITAL_COMMUNITY): Payer: Medicare Other

## 2015-07-06 DIAGNOSIS — Z6841 Body Mass Index (BMI) 40.0 and over, adult: Secondary | ICD-10-CM

## 2015-07-06 DIAGNOSIS — M1990 Unspecified osteoarthritis, unspecified site: Secondary | ICD-10-CM | POA: Diagnosis present

## 2015-07-06 DIAGNOSIS — W19XXXA Unspecified fall, initial encounter: Secondary | ICD-10-CM | POA: Diagnosis present

## 2015-07-06 DIAGNOSIS — I10 Essential (primary) hypertension: Secondary | ICD-10-CM | POA: Diagnosis present

## 2015-07-06 DIAGNOSIS — Z419 Encounter for procedure for purposes other than remedying health state, unspecified: Secondary | ICD-10-CM

## 2015-07-06 DIAGNOSIS — K3184 Gastroparesis: Secondary | ICD-10-CM | POA: Diagnosis present

## 2015-07-06 DIAGNOSIS — I4891 Unspecified atrial fibrillation: Secondary | ICD-10-CM | POA: Diagnosis present

## 2015-07-06 DIAGNOSIS — K219 Gastro-esophageal reflux disease without esophagitis: Secondary | ICD-10-CM | POA: Diagnosis present

## 2015-07-06 DIAGNOSIS — F329 Major depressive disorder, single episode, unspecified: Secondary | ICD-10-CM | POA: Diagnosis present

## 2015-07-06 DIAGNOSIS — Z7982 Long term (current) use of aspirin: Secondary | ICD-10-CM

## 2015-07-06 DIAGNOSIS — S42291A Other displaced fracture of upper end of right humerus, initial encounter for closed fracture: Principal | ICD-10-CM | POA: Diagnosis present

## 2015-07-06 DIAGNOSIS — K2270 Barrett's esophagus without dysplasia: Secondary | ICD-10-CM | POA: Diagnosis present

## 2015-07-06 DIAGNOSIS — Z7902 Long term (current) use of antithrombotics/antiplatelets: Secondary | ICD-10-CM

## 2015-07-06 DIAGNOSIS — S42222A 2-part displaced fracture of surgical neck of left humerus, initial encounter for closed fracture: Secondary | ICD-10-CM | POA: Diagnosis not present

## 2015-07-06 DIAGNOSIS — Z87891 Personal history of nicotine dependence: Secondary | ICD-10-CM

## 2015-07-06 DIAGNOSIS — S42293A Other displaced fracture of upper end of unspecified humerus, initial encounter for closed fracture: Secondary | ICD-10-CM | POA: Diagnosis present

## 2015-07-06 DIAGNOSIS — Z888 Allergy status to other drugs, medicaments and biological substances status: Secondary | ICD-10-CM

## 2015-07-06 DIAGNOSIS — S42201B Unspecified fracture of upper end of right humerus, initial encounter for open fracture: Secondary | ICD-10-CM | POA: Diagnosis not present

## 2015-07-06 DIAGNOSIS — K449 Diaphragmatic hernia without obstruction or gangrene: Secondary | ICD-10-CM | POA: Diagnosis present

## 2015-07-06 DIAGNOSIS — S42209A Unspecified fracture of upper end of unspecified humerus, initial encounter for closed fracture: Secondary | ICD-10-CM | POA: Diagnosis present

## 2015-07-06 DIAGNOSIS — R7989 Other specified abnormal findings of blood chemistry: Secondary | ICD-10-CM | POA: Diagnosis present

## 2015-07-06 DIAGNOSIS — E039 Hypothyroidism, unspecified: Secondary | ICD-10-CM | POA: Diagnosis present

## 2015-07-06 DIAGNOSIS — S42201D Unspecified fracture of upper end of right humerus, subsequent encounter for fracture with routine healing: Secondary | ICD-10-CM | POA: Diagnosis not present

## 2015-07-06 DIAGNOSIS — G8918 Other acute postprocedural pain: Secondary | ICD-10-CM | POA: Diagnosis not present

## 2015-07-06 HISTORY — PX: ORIF HUMERUS FRACTURE: SHX2126

## 2015-07-06 HISTORY — DX: Reserved for inherently not codable concepts without codable children: IMO0001

## 2015-07-06 LAB — CBC WITH DIFFERENTIAL/PLATELET
Basophils Absolute: 0 10*3/uL (ref 0.0–0.1)
Basophils Relative: 0 % (ref 0–1)
Eosinophils Absolute: 0.1 10*3/uL (ref 0.0–0.7)
Eosinophils Relative: 1 % (ref 0–5)
HCT: 37.7 % (ref 36.0–46.0)
HEMOGLOBIN: 12.4 g/dL (ref 12.0–15.0)
LYMPHS ABS: 2.4 10*3/uL (ref 0.7–4.0)
LYMPHS PCT: 26 % (ref 12–46)
MCH: 30 pg (ref 26.0–34.0)
MCHC: 32.9 g/dL (ref 30.0–36.0)
MCV: 91.3 fL (ref 78.0–100.0)
MONO ABS: 0.8 10*3/uL (ref 0.1–1.0)
Monocytes Relative: 9 % (ref 3–12)
NEUTROS ABS: 6.1 10*3/uL (ref 1.7–7.7)
NEUTROS PCT: 64 % (ref 43–77)
Platelets: 369 10*3/uL (ref 150–400)
RBC: 4.13 MIL/uL (ref 3.87–5.11)
RDW: 14.6 % (ref 11.5–15.5)
WBC: 9.4 10*3/uL (ref 4.0–10.5)

## 2015-07-06 LAB — COMPREHENSIVE METABOLIC PANEL
ALK PHOS: 124 U/L (ref 38–126)
ALT: 12 U/L — ABNORMAL LOW (ref 14–54)
AST: 24 U/L (ref 15–41)
Albumin: 3.4 g/dL — ABNORMAL LOW (ref 3.5–5.0)
Anion gap: 13 (ref 5–15)
BILIRUBIN TOTAL: 0.8 mg/dL (ref 0.3–1.2)
BUN: 11 mg/dL (ref 6–20)
CO2: 27 mmol/L (ref 22–32)
Calcium: 9 mg/dL (ref 8.9–10.3)
Chloride: 97 mmol/L — ABNORMAL LOW (ref 101–111)
Creatinine, Ser: 1.16 mg/dL — ABNORMAL HIGH (ref 0.44–1.00)
GFR, EST AFRICAN AMERICAN: 52 mL/min — AB (ref >60)
GFR, EST NON AFRICAN AMERICAN: 45 mL/min — AB (ref >60)
GLUCOSE: 113 mg/dL — AB (ref 65–99)
Potassium: 3.8 mmol/L (ref 3.5–5.1)
SODIUM: 137 mmol/L (ref 135–145)
TOTAL PROTEIN: 6.5 g/dL (ref 6.5–8.1)

## 2015-07-06 LAB — APTT: aPTT: 32 seconds (ref 24–37)

## 2015-07-06 LAB — PROTIME-INR
INR: 1.17 (ref 0.00–1.49)
Prothrombin Time: 15 seconds (ref 11.6–15.2)

## 2015-07-06 SURGERY — OPEN REDUCTION INTERNAL FIXATION (ORIF) PROXIMAL HUMERUS FRACTURE
Anesthesia: General | Site: Shoulder | Laterality: Right

## 2015-07-06 MED ORDER — PHENYLEPHRINE 40 MCG/ML (10ML) SYRINGE FOR IV PUSH (FOR BLOOD PRESSURE SUPPORT)
PREFILLED_SYRINGE | INTRAVENOUS | Status: AC
  Filled 2015-07-06: qty 10

## 2015-07-06 MED ORDER — BISACODYL 5 MG PO TBEC
5.0000 mg | DELAYED_RELEASE_TABLET | Freq: Every day | ORAL | Status: DC | PRN
Start: 2015-07-06 — End: 2015-07-10
  Administered 2015-07-09: 5 mg via ORAL
  Filled 2015-07-06: qty 1

## 2015-07-06 MED ORDER — ARTIFICIAL TEARS OP OINT
TOPICAL_OINTMENT | OPHTHALMIC | Status: DC | PRN
  Administered 2015-07-06: 1 via OPHTHALMIC

## 2015-07-06 MED ORDER — LEVOTHYROXINE SODIUM 150 MCG PO TABS
150.0000 ug | ORAL_TABLET | Freq: Every day | ORAL | Status: DC
  Administered 2015-07-07 – 2015-07-10 (×4): 150 ug via ORAL
  Filled 2015-07-06 (×5): qty 1

## 2015-07-06 MED ORDER — GLYCOPYRROLATE 0.2 MG/ML IJ SOLN
INTRAMUSCULAR | Status: DC | PRN
Start: 2015-07-06 — End: 2015-07-06
  Administered 2015-07-06: 0.4 mg via INTRAVENOUS

## 2015-07-06 MED ORDER — PHENYLEPHRINE HCL 10 MG/ML IJ SOLN
10.0000 mg | INTRAVENOUS | Status: DC | PRN
  Administered 2015-07-06: 20 ug/min via INTRAVENOUS
  Administered 2015-07-06: 30 ug/min via INTRAVENOUS

## 2015-07-06 MED ORDER — GLYCOPYRROLATE 0.2 MG/ML IJ SOLN
INTRAMUSCULAR | Status: AC
  Filled 2015-07-06: qty 3

## 2015-07-06 MED ORDER — PHENOL 1.4 % MT LIQD: 1.0000 | OROMUCOSAL | Status: DC | PRN

## 2015-07-06 MED ORDER — ROPIVACAINE HCL 5 MG/ML IJ SOLN
INTRAMUSCULAR | Status: DC | PRN
  Administered 2015-07-06: 20 mL via PERINEURAL

## 2015-07-06 MED ORDER — CEFAZOLIN SODIUM-DEXTROSE 2-3 GM-% IV SOLR
2.0000 g | Freq: Four times a day (QID) | INTRAVENOUS | Status: AC
  Administered 2015-07-06 – 2015-07-07 (×3): 2 g via INTRAVENOUS
  Filled 2015-07-06 (×3): qty 50

## 2015-07-06 MED ORDER — FENTANYL CITRATE (PF) 250 MCG/5ML IJ SOLN
INTRAMUSCULAR | Status: AC
  Filled 2015-07-06: qty 5

## 2015-07-06 MED ORDER — FENTANYL CITRATE (PF) 100 MCG/2ML IJ SOLN
100.0000 ug | Freq: Once | INTRAMUSCULAR | Status: AC
  Administered 2015-07-06: 50 ug via INTRAVENOUS

## 2015-07-06 MED ORDER — FENTANYL CITRATE (PF) 100 MCG/2ML IJ SOLN
INTRAMUSCULAR | Status: DC | PRN
  Administered 2015-07-06 (×3): 50 ug via INTRAVENOUS

## 2015-07-06 MED ORDER — DOCUSATE SODIUM 100 MG PO CAPS
100.0000 mg | ORAL_CAPSULE | Freq: Two times a day (BID) | ORAL | Status: DC
  Administered 2015-07-06 – 2015-07-10 (×8): 100 mg via ORAL
  Filled 2015-07-06 (×8): qty 1

## 2015-07-06 MED ORDER — LACTATED RINGERS IV SOLN
INTRAVENOUS | Status: DC
  Administered 2015-07-06: 18:00:00 via INTRAVENOUS

## 2015-07-06 MED ORDER — PROMETHAZINE HCL 25 MG/ML IJ SOLN: 6.2500 mg | INTRAMUSCULAR | Status: DC | PRN

## 2015-07-06 MED ORDER — ALUM & MAG HYDROXIDE-SIMETH 200-200-20 MG/5ML PO SUSP: 30.0000 mL | ORAL | Status: DC | PRN

## 2015-07-06 MED ORDER — CHLORHEXIDINE GLUCONATE 4 % EX LIQD: 60.0000 mL | Freq: Once | CUTANEOUS | Status: DC

## 2015-07-06 MED ORDER — ASPIRIN 81 MG PO CHEW
81.0000 mg | CHEWABLE_TABLET | Freq: Every day | ORAL | Status: DC
  Administered 2015-07-07 – 2015-07-10 (×4): 81 mg via ORAL
  Filled 2015-07-06 (×8): qty 1

## 2015-07-06 MED ORDER — PROPOFOL 10 MG/ML IV BOLUS
INTRAVENOUS | Status: AC
  Filled 2015-07-06: qty 20

## 2015-07-06 MED ORDER — POLYETHYLENE GLYCOL 3350 17 G PO PACK
17.0000 g | PACK | Freq: Every day | ORAL | Status: DC | PRN
  Administered 2015-07-09: 17 g via ORAL
  Filled 2015-07-06: qty 1

## 2015-07-06 MED ORDER — MENTHOL 3 MG MT LOZG: 1.0000 | LOZENGE | OROMUCOSAL | Status: DC | PRN

## 2015-07-06 MED ORDER — PANTOPRAZOLE SODIUM 40 MG PO TBEC
40.0000 mg | DELAYED_RELEASE_TABLET | Freq: Every day | ORAL | Status: DC
  Administered 2015-07-07 – 2015-07-10 (×4): 40 mg via ORAL
  Filled 2015-07-06 (×4): qty 1

## 2015-07-06 MED ORDER — ONDANSETRON HCL 4 MG PO TABS: 4.0000 mg | ORAL_TABLET | Freq: Four times a day (QID) | ORAL | Status: DC | PRN

## 2015-07-06 MED ORDER — MAGNESIUM CITRATE PO SOLN: 1.0000 | Freq: Once | ORAL | Status: AC | PRN

## 2015-07-06 MED ORDER — SUCCINYLCHOLINE CHLORIDE 20 MG/ML IJ SOLN
INTRAMUSCULAR | Status: DC | PRN
  Administered 2015-07-06: 140 mg via INTRAVENOUS

## 2015-07-06 MED ORDER — DIAZEPAM 5 MG PO TABS: 5.0000 mg | ORAL_TABLET | Freq: Four times a day (QID) | ORAL | Status: DC | PRN

## 2015-07-06 MED ORDER — OXYCODONE HCL 5 MG PO TABS
5.0000 mg | ORAL_TABLET | ORAL | Status: DC | PRN
  Administered 2015-07-06 – 2015-07-09 (×7): 10 mg via ORAL
  Filled 2015-07-06 (×7): qty 2

## 2015-07-06 MED ORDER — HYDROMORPHONE HCL 1 MG/ML IJ SOLN
INTRAMUSCULAR | Status: AC
  Filled 2015-07-06: qty 1

## 2015-07-06 MED ORDER — FAMOTIDINE 20 MG PO TABS
20.0000 mg | ORAL_TABLET | Freq: Every day | ORAL | Status: DC
  Administered 2015-07-06 – 2015-07-09 (×4): 20 mg via ORAL
  Filled 2015-07-06 (×4): qty 1

## 2015-07-06 MED ORDER — PROPOFOL 10 MG/ML IV BOLUS
INTRAVENOUS | Status: DC | PRN
  Administered 2015-07-06: 160 mg via INTRAVENOUS

## 2015-07-06 MED ORDER — COLCHICINE 0.6 MG PO TABS: 0.6000 mg | ORAL_TABLET | Freq: Every day | ORAL | Status: DC

## 2015-07-06 MED ORDER — ROSUVASTATIN CALCIUM 10 MG PO TABS
10.0000 mg | ORAL_TABLET | Freq: Every day | ORAL | Status: DC
  Administered 2015-07-06 – 2015-07-09 (×4): 10 mg via ORAL
  Filled 2015-07-06 (×4): qty 1

## 2015-07-06 MED ORDER — EPHEDRINE SULFATE 50 MG/ML IJ SOLN
INTRAMUSCULAR | Status: AC
  Filled 2015-07-06: qty 1

## 2015-07-06 MED ORDER — HYDROMORPHONE HCL 1 MG/ML IJ SOLN
0.2500 mg | INTRAMUSCULAR | Status: DC | PRN
  Administered 2015-07-06 (×3): 0.5 mg via INTRAVENOUS

## 2015-07-06 MED ORDER — SODIUM CHLORIDE 0.9 % IJ SOLN
INTRAMUSCULAR | Status: AC
  Filled 2015-07-06: qty 10

## 2015-07-06 MED ORDER — ALLOPURINOL 100 MG PO TABS
100.0000 mg | ORAL_TABLET | Freq: Two times a day (BID) | ORAL | Status: DC
  Administered 2015-07-06 – 2015-07-10 (×8): 100 mg via ORAL
  Filled 2015-07-06 (×8): qty 1

## 2015-07-06 MED ORDER — MIDAZOLAM HCL 2 MG/2ML IJ SOLN
INTRAMUSCULAR | Status: AC
  Administered 2015-07-06: 1 mg via INTRAVENOUS
  Filled 2015-07-06: qty 2

## 2015-07-06 MED ORDER — FENTANYL CITRATE (PF) 100 MCG/2ML IJ SOLN
INTRAMUSCULAR | Status: AC
  Administered 2015-07-06: 50 ug via INTRAVENOUS
  Filled 2015-07-06: qty 2

## 2015-07-06 MED ORDER — LIDOCAINE-EPINEPHRINE (PF) 1.5 %-1:200000 IJ SOLN
INTRAMUSCULAR | Status: DC | PRN
  Administered 2015-07-06: 10 mL via PERINEURAL

## 2015-07-06 MED ORDER — CITALOPRAM HYDROBROMIDE 20 MG PO TABS
20.0000 mg | ORAL_TABLET | Freq: Every day | ORAL | Status: DC
  Administered 2015-07-07 – 2015-07-10 (×4): 20 mg via ORAL
  Filled 2015-07-06 (×4): qty 1

## 2015-07-06 MED ORDER — NEOSTIGMINE METHYLSULFATE 10 MG/10ML IV SOLN
INTRAVENOUS | Status: DC | PRN
  Administered 2015-07-06: 3 mg via INTRAVENOUS

## 2015-07-06 MED ORDER — ONDANSETRON HCL 4 MG/2ML IJ SOLN
4.0000 mg | Freq: Four times a day (QID) | INTRAMUSCULAR | Status: DC | PRN
Start: 2015-07-06 — End: 2015-07-10

## 2015-07-06 MED ORDER — ACETAMINOPHEN 650 MG RE SUPP: 650.0000 mg | Freq: Four times a day (QID) | RECTAL | Status: DC | PRN

## 2015-07-06 MED ORDER — MIDAZOLAM HCL 2 MG/2ML IJ SOLN
2.0000 mg | Freq: Once | INTRAMUSCULAR | Status: AC
  Administered 2015-07-06: 1 mg via INTRAVENOUS

## 2015-07-06 MED ORDER — LIDOCAINE HCL 4 % MT SOLN
OROMUCOSAL | Status: DC | PRN
  Administered 2015-07-06: 4 mL via TOPICAL

## 2015-07-06 MED ORDER — ROCURONIUM BROMIDE 100 MG/10ML IV SOLN
INTRAVENOUS | Status: DC | PRN
  Administered 2015-07-06: 20 mg via INTRAVENOUS

## 2015-07-06 MED ORDER — METOPROLOL SUCCINATE ER 25 MG PO TB24
25.0000 mg | ORAL_TABLET | Freq: Two times a day (BID) | ORAL | Status: DC
  Administered 2015-07-06 – 2015-07-10 (×8): 25 mg via ORAL
  Filled 2015-07-06 (×8): qty 1

## 2015-07-06 MED ORDER — ACETAMINOPHEN 325 MG PO TABS: 650.0000 mg | ORAL_TABLET | Freq: Four times a day (QID) | ORAL | Status: DC | PRN

## 2015-07-06 MED ORDER — METOCLOPRAMIDE HCL 5 MG PO TABS: 5.0000 mg | ORAL_TABLET | Freq: Three times a day (TID) | ORAL | Status: DC | PRN

## 2015-07-06 MED ORDER — LIDOCAINE HCL (CARDIAC) 20 MG/ML IV SOLN
INTRAVENOUS | Status: DC | PRN
  Administered 2015-07-06: 40 mg via INTRAVENOUS

## 2015-07-06 MED ORDER — HYDROMORPHONE HCL 1 MG/ML IJ SOLN
1.0000 mg | INTRAMUSCULAR | Status: DC | PRN
  Administered 2015-07-06 – 2015-07-10 (×2): 1 mg via INTRAVENOUS
  Filled 2015-07-06 (×2): qty 1

## 2015-07-06 MED ORDER — DABIGATRAN ETEXILATE MESYLATE 150 MG PO CAPS
150.0000 mg | ORAL_CAPSULE | Freq: Two times a day (BID) | ORAL | Status: DC
  Administered 2015-07-06 – 2015-07-09 (×7): 150 mg via ORAL
  Filled 2015-07-06 (×9): qty 1

## 2015-07-06 MED ORDER — ONDANSETRON HCL 4 MG/2ML IJ SOLN
INTRAMUSCULAR | Status: AC
  Filled 2015-07-06: qty 2

## 2015-07-06 MED ORDER — LACTATED RINGERS IV SOLN
INTRAVENOUS | Status: DC | PRN
  Administered 2015-07-06: 14:00:00 via INTRAVENOUS

## 2015-07-06 MED ORDER — METOCLOPRAMIDE HCL 5 MG/ML IJ SOLN: 5.0000 mg | Freq: Three times a day (TID) | INTRAMUSCULAR | Status: DC | PRN

## 2015-07-06 MED ORDER — SUCCINYLCHOLINE CHLORIDE 20 MG/ML IJ SOLN
INTRAMUSCULAR | Status: AC
  Filled 2015-07-06: qty 1

## 2015-07-06 SURGICAL SUPPLY — 59 items
ADH SKN CLS APL DERMABOND .7 (GAUZE/BANDAGES/DRESSINGS) ×1
BIT DRILL 3.2 (BIT) ×2
BIT DRILL 3.2XCALB NS DISP (BIT) IMPLANT
BIT DRILL CALIBRATED 2.7 (BIT) ×1 IMPLANT
BIT DRL 3.2XCALB NS DISP (BIT) ×1
COVER SURGICAL LIGHT HANDLE (MISCELLANEOUS) ×2 IMPLANT
DERMABOND ADVANCED (GAUZE/BANDAGES/DRESSINGS) ×1
DERMABOND ADVANCED .7 DNX12 (GAUZE/BANDAGES/DRESSINGS) IMPLANT
DRAPE C-ARM 42X72 X-RAY (DRAPES) ×2 IMPLANT
DRAPE IMP U-DRAPE 54X76 (DRAPES) ×2 IMPLANT
DRAPE INCISE IOBAN 66X45 STRL (DRAPES) ×2 IMPLANT
DRAPE ORTHO SPLIT 77X108 STRL (DRAPES) ×4
DRAPE SURG ORHT 6 SPLT 77X108 (DRAPES) ×2 IMPLANT
DRAPE U-SHAPE 47X51 STRL (DRAPES) ×2 IMPLANT
DRSG AQUACEL AG ADV 3.5X10 (GAUZE/BANDAGES/DRESSINGS) ×1 IMPLANT
DRSG MEPILEX BORDER 4X8 (GAUZE/BANDAGES/DRESSINGS) ×2 IMPLANT
DURAPREP 26ML APPLICATOR (WOUND CARE) ×2 IMPLANT
ELECT REM PT RETURN 9FT ADLT (ELECTROSURGICAL) ×2
ELECTRODE REM PT RTRN 9FT ADLT (ELECTROSURGICAL) ×1 IMPLANT
GLOVE BIO SURGEON STRL SZ7.5 (GLOVE) ×2 IMPLANT
GLOVE BIO SURGEON STRL SZ8 (GLOVE) ×2 IMPLANT
GLOVE EUDERMIC 7 POWDERFREE (GLOVE) ×4 IMPLANT
GLOVE SS BIOGEL STRL SZ 7.5 (GLOVE) ×2 IMPLANT
GLOVE SUPERSENSE BIOGEL SZ 7.5 (GLOVE) ×2
GOWN STRL REUS W/ TWL LRG LVL3 (GOWN DISPOSABLE) ×1 IMPLANT
GOWN STRL REUS W/ TWL XL LVL3 (GOWN DISPOSABLE) ×2 IMPLANT
GOWN STRL REUS W/TWL LRG LVL3 (GOWN DISPOSABLE) ×2
GOWN STRL REUS W/TWL XL LVL3 (GOWN DISPOSABLE) ×4
K-WIRE 2X5 SS THRDED S3 (WIRE) ×2
KIT BASIN OR (CUSTOM PROCEDURE TRAY) ×2 IMPLANT
KIT ROOM TURNOVER OR (KITS) ×4 IMPLANT
KWIRE 2X5 SS THRDED S3 (WIRE) IMPLANT
MANIFOLD NEPTUNE II (INSTRUMENTS) ×2 IMPLANT
NEEDLE 22X1 1/2 (OR ONLY) (NEEDLE) ×2 IMPLANT
NS IRRIG 1000ML POUR BTL (IV SOLUTION) ×2 IMPLANT
PACK SHOULDER (CUSTOM PROCEDURE TRAY) ×2 IMPLANT
PAD ARMBOARD 7.5X6 YLW CONV (MISCELLANEOUS) ×4 IMPLANT
PEG LOCKING 3.2MMX46 (Peg) ×2 IMPLANT
PEG LOCKING 3.2X32 (Peg) ×1 IMPLANT
PEG LOCKING 3.2X34 (Screw) ×2 IMPLANT
PEG LOCKING 3.2X36 (Screw) ×3 IMPLANT
PEG LOCKING 3.2X38 (Screw) ×1 IMPLANT
PEG LOCKING 3.2X48 (Peg) ×1 IMPLANT
PLATE PROX HUM HI R 3H 80 (Plate) ×1 IMPLANT
SCREW LP NL T15 3.5X24 (Screw) ×3 IMPLANT
SCREW PEG LOCK 3.2X30MM (Screw) ×1 IMPLANT
SLING ARM FOAM STRAP LRG (SOFTGOODS) ×1 IMPLANT
SLING ULTRA II LARGE (SOFTGOODS) ×2 IMPLANT
SPONGE LAP 18X18 X RAY DECT (DISPOSABLE) ×2 IMPLANT
SUCTION FRAZIER TIP 10 FR DISP (SUCTIONS) ×2 IMPLANT
SUT MNCRL AB 3-0 PS2 18 (SUTURE) ×2 IMPLANT
SUT VIC AB 1 CT1 27 (SUTURE) ×2
SUT VIC AB 1 CT1 27XBRD ANTBC (SUTURE) ×1 IMPLANT
SUT VIC AB 2-0 CT1 27 (SUTURE) ×4
SUT VIC AB 2-0 CT1 TAPERPNT 27 (SUTURE) ×2 IMPLANT
SYR CONTROL 10ML LL (SYRINGE) ×2 IMPLANT
TOWEL OR 17X24 6PK STRL BLUE (TOWEL DISPOSABLE) ×2 IMPLANT
TOWEL OR 17X26 10 PK STRL BLUE (TOWEL DISPOSABLE) ×2 IMPLANT
WATER STERILE IRR 1000ML POUR (IV SOLUTION) ×2 IMPLANT

## 2015-07-06 NOTE — Anesthesia Preprocedure Evaluation (Signed)
Anesthesia Evaluation  Patient identified by MRN, date of birth, ID band Patient awake    Reviewed: Allergy & Precautions, NPO status , Patient's Chart, lab work & pertinent test results  Airway Mallampati: III  TM Distance: <3 FB Neck ROM: Full    Dental no notable dental hx.    Pulmonary neg pulmonary ROS, former smoker,  breath sounds clear to auscultation  Pulmonary exam normal       Cardiovascular hypertension, negative cardio ROS Normal cardiovascular exam+ dysrhythmias Atrial Fibrillation + Valvular Problems/Murmurs Rhythm:Regular Rate:Normal     Neuro/Psych negative neurological ROS  negative psych ROS   GI/Hepatic negative GI ROS, Neg liver ROS,   Endo/Other  Hypothyroidism Morbid obesity  Renal/GU negative Renal ROS  negative genitourinary   Musculoskeletal negative musculoskeletal ROS (+)   Abdominal   Peds negative pediatric ROS (+)  Hematology negative hematology ROS (+)   Anesthesia Other Findings   Reproductive/Obstetrics negative OB ROS                             Anesthesia Physical Anesthesia Plan  ASA: III  Anesthesia Plan: General   Post-op Pain Management: GA combined w/ Regional for post-op pain   Induction: Intravenous  Airway Management Planned: Oral ETT  Additional Equipment:   Intra-op Plan:   Post-operative Plan: Extubation in OR  Informed Consent: I have reviewed the patients History and Physical, chart, labs and discussed the procedure including the risks, benefits and alternatives for the proposed anesthesia with the patient or authorized representative who has indicated his/her understanding and acceptance.   Dental advisory given  Plan Discussed with: CRNA and Surgeon  Anesthesia Plan Comments:         Anesthesia Quick Evaluation

## 2015-07-06 NOTE — H&P (Signed)
Amber Wu    Chief Complaint: RIGHT PROXIMAL HUMERUS FRACTURE HPI: The patient is a 74 y.o. female with a severely displaced right 2 part proximal humerus fracture  Past Medical History  Diagnosis Date  . Allergy   . Hypothyroidism   . COPD (chronic obstructive pulmonary disease)   . GERD (gastroesophageal reflux disease)   . Barrett's esophagus   . Hypertension   . Depression   . Arthritis   . History of esophageal stricture   . Atrial fibrillation     2D ECHO, 08/12/2012 - EF >55%, LA moderately dilated, RA moderately dilated, moderate tricuspid valve regurgitation  . Gastroparesis   . Hiatal hernia   . Shortness of breath dyspnea     Past Surgical History  Procedure Laterality Date  . Facial cosmetic surgery  2000  . Abdominal hysterectomy  1970  . Cardioversion  09/14/2012    Procedure: CARDIOVERSION;  Surgeon: Pixie Casino, MD;  Location: [facility] ENDOSCOPY;  Service: Cardiovascular;  Laterality: N/A;  . Cataract extraction Bilateral 06/2014-08/2014  . Colonoscopy    . Knee surgery Right     Family History  Problem Relation Age of Onset  . Colon cancer Neg Hx     Social History:  reports that she quit smoking about 21 years ago. Her smoking use included Cigarettes. She has a 7.5 pack-year smoking history. She has never used smokeless tobacco. She reports that she does not drink alcohol or use illicit drugs.  Allergies:  Allergies  Allergen Reactions  . Antihistamines, Chlorpheniramine-Type Other (See Comments)    INTENSE SHAKING AND UTI  . Antihistamines, Diphenhydramine-Type Other (See Comments)    INTENSE SHAKING AND UTI  . Antihistamines, Loratadine-Type Other (See Comments)    INTENSE SHAKING AND UTI    Medications Prior to Admission  Medication Sig Dispense Refill  . allopurinol (ZYLOPRIM) 100 MG tablet Take 100 mg by mouth 2 (two) times daily.     Marland Kitchen aspirin 81 MG tablet Take 81 mg by mouth daily.      . Cholecalciferol (VITAMIN D-3) 1000 UNITS CAPS  Take 1,000 Units by mouth daily.    . citalopram (CELEXA) 20 MG tablet Take 20 mg by mouth daily.    Marland Kitchen dexlansoprazole (DEXILANT) 60 MG capsule Take 60 mg by mouth daily.      . furosemide (LASIX) 20 MG tablet Take 1 tablet (20 mg total) by mouth daily as needed. 90 tablet 3  . HYDROcodone-acetaminophen (LORTAB) 7.5-500 MG per tablet Take 1 tablet by mouth as needed for pain.    Marland Kitchen ibuprofen (ADVIL,MOTRIN) 200 MG tablet Take 200 mg by mouth every 6 (six) hours as needed.    Marland Kitchen levothyroxine (SYNTHROID, LEVOTHROID) 175 MCG tablet Take 150 mcg by mouth daily before breakfast.     . metoprolol succinate (TOPROL-XL) 25 MG 24 hr tablet Take 25 mg by mouth 2 (two) times daily.     . Multiple Vitamin (MULTIVITAMIN) tablet Take 1 tablet by mouth daily.    Marland Kitchen oxyCODONE-acetaminophen (PERCOCET/ROXICET) 5-325 MG per tablet 1 to 2 tabs PO q6hrs  PRN for pain 15 tablet 0  . Psyllium (METAMUCIL PO) Take 1 Package by mouth daily.    . rosuvastatin (CRESTOR) 10 MG tablet Take 10 mg by mouth daily.      . colchicine 0.6 MG tablet Take 0.6 mg by mouth daily.    . dabigatran (PRADAXA) 75 MG CAPS Take 150 mg by mouth every 12 (twelve) hours.     . famotidine (  PEPCID) 20 MG tablet Take 20 mg by mouth at bedtime.    . Naproxen Sodium (ALEVE) 220 MG CAPS Take by mouth as needed.       Physical Exam: right shoulder with diffuse tenderness and painful and restricted motion as noted at recent office visits  Vitals  Temp:  [97.9 F (36.6 C)] 97.9 F (36.6 C) (07/28 1219) Pulse Rate:  [90] 90 (07/28 1219) Resp:  [16] 16 (07/28 1219) BP: (152)/(76) 152/76 mmHg (07/28 1219) SpO2:  [98 %] 98 % (07/28 1219) Weight:  [118.389 kg (261 lb)] 118.389 kg (261 lb) (07/28 1219)  Assessment/Plan  Impression:  DISPLACED RIGHT PROXIMAL HUMERUS FRACTURE  Plan of Action: Procedure(s): OPEN REDUCTION INTERNAL FIXATION (ORIF) RIGHT PROXIMAL HUMERUS FRACTURE  Amber Wu M 07/06/2015, 1:59 PM Contact # [phone]

## 2015-07-06 NOTE — Progress Notes (Signed)
CRNA at bedside.

## 2015-07-06 NOTE — Anesthesia Procedure Notes (Addendum)
Anesthesia Regional Block:  Supraclavicular block  Pre-Anesthetic Checklist: ,, timeout performed, Correct Patient, Correct Site, Correct Laterality, Correct Procedure, Correct Position, site marked, Risks and benefits discussed,  Surgical consent,  Pre-op evaluation,  At surgeon's request and post-op pain management  Laterality: Right  Prep: chloraprep       Needles:  Injection technique: Single-shot  Needle Type: Echogenic Needle     Needle Length: 9cm 9 cm Needle Gauge: 21 and 21 G    Additional Needles:  Procedures: ultrasound guided (picture in chart) Supraclavicular block Narrative:  Injection made incrementally with aspirations every 5 mL.  Performed by: Personally   Additional Notes: Risks, benefits and alternative to block explained extensively.  Patient tolerated procedure well, without complications.   Procedure Name: Intubation Date/Time: 07/06/2015 2:40 PM Performed by: Susa Loffler Pre-anesthesia Checklist: Patient identified, Emergency Drugs available, Suction available, Patient being monitored and Timeout performed Patient Re-evaluated:Patient Re-evaluated prior to inductionOxygen Delivery Method: Circle system utilized Preoxygenation: Pre-oxygenation with 100% oxygen Intubation Type: IV induction Ventilation: Mask ventilation without difficulty Laryngoscope Size: Mac and 3 Grade View: Grade I Tube type: Oral Tube size: 7.5 mm Number of attempts: 1 Airway Equipment and Method: Stylet and LTA kit utilized Placement Confirmation: ETT inserted through vocal cords under direct vision,  positive ETCO2 and breath sounds checked- equal and bilateral Secured at: 21 cm Tube secured with: Tape Dental Injury: Teeth and Oropharynx as per pre-operative assessment

## 2015-07-06 NOTE — Op Note (Signed)
07/06/2015  4:43 PM  PATIENT:   Amber Wu  74 y.o. female  PRE-OPERATIVE DIAGNOSIS:  SEVERELY DISPLACED RIGHT PROXIMAL HUMERUS FRACTURE  POST-OPERATIVE DIAGNOSIS:  same  PROCEDURE:  ORIF  SURGEON:  Travelle Mcclimans, Metta Clines. M.D.  ASSISTANTS: [name] pac   ANESTHESIA:   GET + ISB  EBL: 150  SPECIMEN:  none  Drains: none   PATIENT DISPOSITION:  PACU - hemodynamically stable.    PLAN OF CARE: Admit for overnight observation  Dictation# [id number]   Contact # [phone]

## 2015-07-06 NOTE — Transfer of Care (Signed)
Immediate Anesthesia Transfer of Care Note  Patient: Amber Wu  Procedure(s) Performed: Procedure(s): OPEN REDUCTION INTERNAL FIXATION (ORIF) RIGHT PROXIMAL HUMERUS FRACTURE (Right)  Patient Location: PACU  Anesthesia Type:General  Level of Consciousness: awake, alert , oriented and patient cooperative  Airway & Oxygen Therapy: Patient Spontanous Breathing and Patient connected to nasal cannula oxygen  Post-op Assessment: Report given to RN, Post -op Vital signs reviewed and stable and Patient moving all extremities  Post vital signs: Reviewed and stable  Last Vitals:  Filed Vitals:   07/06/15 1708  BP:   Pulse: 127  Temp:   Resp: 24    Complications: No apparent anesthesia complications

## 2015-07-07 ENCOUNTER — Encounter (HOSPITAL_COMMUNITY): Payer: Self-pay | Admitting: Orthopedic Surgery

## 2015-07-07 DIAGNOSIS — J449 Chronic obstructive pulmonary disease, unspecified: Secondary | ICD-10-CM | POA: Diagnosis not present

## 2015-07-07 DIAGNOSIS — K3184 Gastroparesis: Secondary | ICD-10-CM | POA: Diagnosis present

## 2015-07-07 DIAGNOSIS — Z6841 Body Mass Index (BMI) 40.0 and over, adult: Secondary | ICD-10-CM | POA: Diagnosis not present

## 2015-07-07 DIAGNOSIS — I4891 Unspecified atrial fibrillation: Secondary | ICD-10-CM | POA: Diagnosis not present

## 2015-07-07 DIAGNOSIS — M6281 Muscle weakness (generalized): Secondary | ICD-10-CM | POA: Diagnosis not present

## 2015-07-07 DIAGNOSIS — S42209A Unspecified fracture of upper end of unspecified humerus, initial encounter for closed fracture: Secondary | ICD-10-CM | POA: Diagnosis present

## 2015-07-07 DIAGNOSIS — R7989 Other specified abnormal findings of blood chemistry: Secondary | ICD-10-CM | POA: Diagnosis present

## 2015-07-07 DIAGNOSIS — Z888 Allergy status to other drugs, medicaments and biological substances status: Secondary | ICD-10-CM | POA: Diagnosis not present

## 2015-07-07 DIAGNOSIS — I071 Rheumatic tricuspid insufficiency: Secondary | ICD-10-CM | POA: Diagnosis not present

## 2015-07-07 DIAGNOSIS — M1990 Unspecified osteoarthritis, unspecified site: Secondary | ICD-10-CM | POA: Diagnosis present

## 2015-07-07 DIAGNOSIS — I10 Essential (primary) hypertension: Secondary | ICD-10-CM | POA: Diagnosis present

## 2015-07-07 DIAGNOSIS — W19XXXA Unspecified fall, initial encounter: Secondary | ICD-10-CM | POA: Diagnosis present

## 2015-07-07 DIAGNOSIS — K219 Gastro-esophageal reflux disease without esophagitis: Secondary | ICD-10-CM | POA: Diagnosis present

## 2015-07-07 DIAGNOSIS — K2270 Barrett's esophagus without dysplasia: Secondary | ICD-10-CM | POA: Diagnosis not present

## 2015-07-07 DIAGNOSIS — S42309A Unspecified fracture of shaft of humerus, unspecified arm, initial encounter for closed fracture: Secondary | ICD-10-CM | POA: Diagnosis not present

## 2015-07-07 DIAGNOSIS — Z9181 History of falling: Secondary | ICD-10-CM | POA: Diagnosis not present

## 2015-07-07 DIAGNOSIS — R2681 Unsteadiness on feet: Secondary | ICD-10-CM | POA: Diagnosis not present

## 2015-07-07 DIAGNOSIS — Z87891 Personal history of nicotine dependence: Secondary | ICD-10-CM | POA: Diagnosis not present

## 2015-07-07 DIAGNOSIS — Z7982 Long term (current) use of aspirin: Secondary | ICD-10-CM | POA: Diagnosis not present

## 2015-07-07 DIAGNOSIS — E039 Hypothyroidism, unspecified: Secondary | ICD-10-CM | POA: Diagnosis present

## 2015-07-07 DIAGNOSIS — S42291A Other displaced fracture of upper end of right humerus, initial encounter for closed fracture: Secondary | ICD-10-CM | POA: Diagnosis not present

## 2015-07-07 DIAGNOSIS — S42291D Other displaced fracture of upper end of right humerus, subsequent encounter for fracture with routine healing: Secondary | ICD-10-CM | POA: Diagnosis not present

## 2015-07-07 DIAGNOSIS — Z7902 Long term (current) use of antithrombotics/antiplatelets: Secondary | ICD-10-CM | POA: Diagnosis not present

## 2015-07-07 DIAGNOSIS — K449 Diaphragmatic hernia without obstruction or gangrene: Secondary | ICD-10-CM | POA: Diagnosis present

## 2015-07-07 DIAGNOSIS — F329 Major depressive disorder, single episode, unspecified: Secondary | ICD-10-CM | POA: Diagnosis present

## 2015-07-07 MED ORDER — DIAZEPAM 5 MG PO TABS: 2.5000 mg | ORAL_TABLET | Freq: Four times a day (QID) | ORAL | Status: DC | PRN

## 2015-07-07 MED ORDER — OXYCODONE-ACETAMINOPHEN 5-325 MG PO TABS: 1.0000 | ORAL_TABLET | ORAL | Status: DC | PRN

## 2015-07-07 NOTE — Discharge Instructions (Signed)
° °  Metta Clines. Supple, M.D., F.A.A.O.S. Orthopaedic Surgery Specializing in Arthroscopic and Reconstructive Surgery of the Shoulder and Knee [phone] [address] [place], [place] [address] - Fax [phone]   POST-OP  SHOULDER  INSTRUCTIONS  1. Call the office at [phone] to schedule your first post-op appointment 10-14 days from the date of your surgery.  2. The bandage over your incision is waterproof. You may begin showering with this dressing on. You may leave this dressing on until first follow up appointment within 2 weeks. If you would like to remove it you may do so after the 5th day. Go slow and tug at the borders gently to break the bond the dressing has with the skin. At this point if there is no drainage it is okay to go without a bandage or you may cover it with a light guaze and tape.   You can expect drainage that is bloody or yellow in nature that should gradually decrease from day of surgery. Change your dressing daily until drainage is completely resolved, then you may feel free to go without a bandage. You can also expect significant bruising around your shoulder that will drift down your arm and into your chest wall. This is very normal and should resolve over several days.   3. Wear your sling/immobilizer at all times except to perform the exercises below or to occasionally let your arm dangle by your side to stretch your elbow. You also need to sleep in your sling immobilizer until instructed otherwise.  4. Range of motion to your elbow, wrist, and hand are encouraged 3-5 times daily. Exercise to your hand and fingers helps to reduce swelling you may experience.  5. Utilize ice to the shoulder 3-5 times minimum a day and additionally if you are experiencing pain.  6. Prescriptions for a pain medication and a muscle relaxant are provided for you. It is recommended that if you are experiencing pain that you pain medication alone is not controlling, add the  muscle relaxant along with the pain medication which can give additional pain relief. The first 1-2 days is generally the most severe of your pain and then should gradually decrease. As your pain lessens it is recommended that you decrease your use of the pain medications to an "as needed basis'" only and to always comply with the recommended dosages of the pain medications.  7. Pain medications can produce constipation along with their use. If you experience this, the use of an over the counter stool softener or laxative daily is recommended.   8. For most patients, if insurance allows, home health services to include therapy has been arranged.  9. For additional questions or concerns, please do not hesitate to call the office. If after hours there is an answering service to forward your concerns to the physician on call.

## 2015-07-07 NOTE — Progress Notes (Signed)
Amber Wu  MRN: [id number] DOB/Age: 75/05/1941 75 y.o. Physician: Rada Hay Procedure: Procedure(s) (LRB): OPEN REDUCTION INTERNAL FIXATION (ORIF) RIGHT PROXIMAL HUMERUS FRACTURE (Right)     Subjective: Overall doing well, pain reasonably well controlled. Has not been up with therapy yet  Vital Signs Temp:  [97.9 F (36.6 C)-98.6 F (37 C)] 98.4 F (36.9 C) (07/29 0519) Pulse Rate:  [58-127] 96 (07/29 0519) Resp:  [13-24] 17 (07/29 0519) BP: (104-156)/(45-89) 130/51 mmHg (07/29 0519) SpO2:  [90 %-98 %] 90 % (07/29 0519) Weight:  [118.389 kg (261 lb)] 118.389 kg (261 lb) (07/28 1219)  Lab Results  Recent Labs  07/06/15 1225  WBC 9.4  HGB 12.4  HCT 37.7  PLT 369   BMET  Recent Labs  07/06/15 1225  NA 137  K 3.8  CL 97*  CO2 27  GLUCOSE 113*  BUN 11  CREATININE 1.16*  CALCIUM 9.0   INR  Date Value Ref Range Status  07/06/2015 1.17 0.00 - 1.49 Final     Exam Right shoulder incision and dressing dry NVI        Plan Will mobilize today to see how she does. Our goal is to DC home with home health services if safe today Possible home today or tomorrow depending on progress. If felt unsafe a back up plan will be short term skilled however with some help she will likely be ok to go home as she had been home for 2 weeks prior to surgery.  [name],TRACY for Dr.Kevin Supple 07/07/2015, 8:24 AM Contact # [phone]

## 2015-07-07 NOTE — Anesthesia Postprocedure Evaluation (Signed)
  Anesthesia Post-op Note  Patient: Amber Wu  Procedure(s) Performed: Procedure(s): OPEN REDUCTION INTERNAL FIXATION (ORIF) RIGHT PROXIMAL HUMERUS FRACTURE (Right)  Patient Location: PACU  Anesthesia Type:General and Regional  Level of Consciousness: awake  Airway and Oxygen Therapy: Patient Spontanous Breathing  Post-op Pain: mild  Post-op Assessment: Post-op Vital signs reviewed, Patient's Cardiovascular Status Stable, Respiratory Function Stable, Patent Airway, No signs of Nausea or vomiting and Pain level controlled              Post-op Vital Signs: Reviewed and stable  Last Vitals:  Filed Vitals:   07/07/15 1300  BP: 120/51  Pulse: 83  Temp: 37.3 C  Resp: 18    Complications: No apparent anesthesia complications

## 2015-07-07 NOTE — Progress Notes (Signed)
Occupational Therapy Evaluation Patient Details Name: Amber Wu MRN: [id number] DOB: 12/14/1940 Today's Date: 07/07/2015    History of Present Illness s/p fall sustaining a R humerus fx; underwent OPEN REDUCTION INTERNAL FIXATION (ORIF) RIGHT PROXIMAL HUMERUS FRACTURE   Clinical Impression   PTA, pt independent with ADL and mobility. Pt limited this am by pain. Pt will need rehab at SNF to facilitate safe return home as pt has limited caregiver support at home. Will follow acutely to address established goals.     Follow Up Recommendations  SNF;Supervision/Assistance - 24 hour    Equipment Recommendations  3 in 1 bedside comode    Recommendations for Other Services       Precautions / Restrictions Precautions Precautions: Shoulder Type of Shoulder Precautions: passive protocol; 0-60 Abd; pendulums OK; AROM elbow/wrist/hand Shoulder Interventions: Shoulder sling/immobilizer;Off for dressing/bathing/exercises Required Braces or Orthoses: Sling Restrictions Weight Bearing Restrictions: Yes RUE Weight Bearing: Non weight bearing      Mobility Bed Mobility Overal bed mobility: Needs Assistance Bed Mobility: Sit to Supine       Sit to supine: Mod assist   General bed mobility comments: needs help for both legs into bed and for positioning. Pt states she slept in a recliner at home  Transfers Overall transfer level: Needs assistance Equipment used: None Transfers: Sit to/from Stand Sit to Stand: Min assist         General transfer comment: steadying assist, cues to maintain NWB    Balance Overall balance assessment: Needs assistance             Standing balance comment: steadying assist for grooming tasks                            ADL Overall ADL's : Needs assistance/impaired                                     Functional mobility during ADLs: Minimal assistance General ADL Comments: see shoulder section. Began education  regarding shoulder protocol and compensaotyr techniques     Vision     Perception     Praxis      Pertinent Vitals/Pain Pain Assessment: 0-10 Pain Score: 5  Pain Location: R shoulder Pain Descriptors / Indicators: Aching Pain Intervention(s): Limited activity within patient's tolerance;Monitored during session;Repositioned;Ice applied     Hand Dominance Right   Extremity/Trunk Assessment Upper Extremity Assessment Upper Extremity Assessment: RUE deficits/detail RUE Deficits / Details: s/p fx   Lower Extremity Assessment Lower Extremity Assessment: Defer to PT evaluation   Cervical / Trunk Assessment Cervical / Trunk Assessment: Normal   Communication Communication Communication: No difficulties   Cognition Arousal/Alertness: Awake/alert Behavior During Therapy: WFL for tasks assessed/performed Overall Cognitive Status: Within Functional Limits for tasks assessed                     General Comments       Exercises Exercises: Shoulder     Shoulder Instructions Shoulder Instructions Donning/doffing shirt without moving shoulder: Maximal assistance Method for sponge bathing under operated UE: Maximal assistance Donning/doffing sling/immobilizer: Maximal assistance Correct positioning of sling/immobilizer: Maximal assistance Pendulum exercises (written home exercise program): Maximal assistance ROM for elbow, wrist and digits of operated UE: Moderate assistance Sling wearing schedule (on at all times/off for ADL's): Moderate assistance Proper positioning of operated UE when showering: Moderate assistance Positioning of  UE while sleeping: Moderate assistance    Home Living Family/patient expects to be discharged to:: Private residence Living Arrangements: Alone Available Help at Discharge: Friend(s);Neighbor Type of Home: House       Home Layout: One level         Bathroom Toilet: Handicapped height     Home Equipment: None           Prior Functioning/Environment Level of Independence: Independent        Comments: pt reports she used furniture for balance    OT Diagnosis: Generalized weakness;Acute pain   OT Problem List: Decreased strength;Decreased range of motion;Decreased activity tolerance;Decreased knowledge of use of DME or AE;Decreased knowledge of precautions;Obesity;Pain;Impaired UE functional use   OT Treatment/Interventions: Self-care/ADL training;Therapeutic exercise;DME and/or AE instruction;Therapeutic activities;Patient/family education    OT Goals(Current goals can be found in the care plan section) Acute Rehab OT Goals Patient Stated Goal: be independent again OT Goal Formulation: With patient Time For Goal Achievement: 07/21/15 Potential to Achieve Goals: Good  OT Frequency: Min 3X/week   Barriers to D/C: Decreased caregiver support          Co-evaluation              End of Session Nurse Communication: Mobility status;Patient requests pain meds  Activity Tolerance: Patient limited by pain Patient left: in bed;with call bell/phone within reach;with bed alarm set   Time: 4034-7425 OT Time Calculation (min): 30 min Charges:  OT General Charges $OT Visit: 1 Procedure OT Evaluation $Initial OT Evaluation Tier I: 1 Procedure OT Treatments $Self Care/Home Management : 8-22 mins G-Codes:    Gladys Deckard,HILLARY 07/26/15, 11:52 AM   Maurie Boettcher, OTR/L  [phone] July 26, 2015

## 2015-07-07 NOTE — Clinical Social Work Placement (Signed)
   CLINICAL SOCIAL WORK PLACEMENT  NOTE  Date:  07/07/2015  Patient Details  Name: Amber Wu MRN: [id number] Date of Birth: [birth year]  Clinical Social Work is seeking post-discharge placement for this patient at the [place] level of care (*CSW will initial, date and re-position this form in  chart as items are completed):  Yes   Patient/family provided with [facility] Work Department's list of facilities offering this level of care within the geographic area requested by the patient (or if unable, by the patient's family).  Yes   Patient/family informed of their freedom to choose among providers that offer the needed level of care, that participate in Medicare, Medicaid or managed care program needed by the patient, have an available bed and are willing to accept the patient.  Yes   Patient/family informed of [facility]'s ownership interest in [facility] and [facility], as well as of the fact that they are under no obligation to receive care at these facilities.  PASRR submitted to EDS on 07/07/15     PASRR number received on 07/07/15     Existing PASRR number confirmed on  (n/a)     FL2 transmitted to all facilities in geographic area requested by pt/family on 07/07/15     FL2 transmitted to all facilities within larger geographic area on 07/07/15     Patient informed that his/her managed care company has contracts with or will negotiate with certain facilities, including the following:   (yes, [facility] and [facility])         Patient/family informed of bed offers received.  Patient chooses bed at       Physician recommends and patient chooses bed at      Patient to be transferred to   on  .  Patient to be transferred to facility by       Patient family notified on   of transfer.  Name of family member notified:        PHYSICIAN Please sign FL2     Additional Comment:     _______________________________________________ Caroline Sauger, LCSW 07/07/2015, 3:17 PM [phone]

## 2015-07-07 NOTE — Evaluation (Signed)
Physical Therapy Evaluation Patient Details Name: Amber Wu MRN: [id number] DOB: November 29, 1941 Today's Date: 07/07/2015   History of Present Illness  pt is s/p OPEN REDUCTION INTERNAL FIXATION (ORIF) RIGHT PROXIMAL HUMERUS FRACTURE 07/06/15  Clinical Impression  Pt presents with decreased strength and balance, decreased activity tolerance and requires assistance for all functional mobility and gait. Pt will benefit from skilled acute PT services to address deficits and increase functional independence.    Follow Up Recommendations Home health PT;SNF;Supervision/Assistance - 24 hour    Equipment Recommendations  None recommended by PT    Recommendations for Other Services       Precautions / Restrictions Precautions Precautions: Shoulder Type of Shoulder Precautions: 0-60 degrees abduction, OK for elbow/wrist AROM Shoulder Interventions: Shoulder sling/immobilizer;Off for dressing/bathing/exercises Required Braces or Orthoses: Sling Restrictions Weight Bearing Restrictions: Yes RUE Weight Bearing: Non weight bearing      Mobility  Bed Mobility Overal bed mobility: Needs Assistance Bed Mobility: Sit to Supine       Sit to supine: Mod assist   General bed mobility comments: needs help for both legs into bed and for positioning. Pt states she slept in a recliner at home  Transfers Overall transfer level: Needs assistance Equipment used: None Transfers: Sit to/from Stand Sit to Stand: Min assist         General transfer comment: steadying assist, cues to maintain NWB  Ambulation/Gait Ambulation/Gait assistance: Min assist Ambulation Distance (Feet): 20 Feet Assistive device: None     Gait velocity interpretation: <1.8 ft/sec, indicative of risk for recurrent falls General Gait Details: pt with unsteadiness requiring min A to correct. needed to use PT hand for support. 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  [place] (Stroke Patients Only)       Balance Overall balance assessment: Needs assistance             Standing balance comment: steadying assist for grooming tasks                             Pertinent Vitals/Pain Pain Assessment: 0-10 Pain Score: 4  Pain Location: shoulder Pain Descriptors / Indicators: Aching Pain Intervention(s): Monitored during session;Repositioned    Home Living Family/patient expects to be discharged to:: Private residence Living Arrangements: Alone Available Help at Discharge: Friend(s);Neighbor Type of Home: House       Home Layout: One level Home Equipment: None      Prior Function Level of Independence: Independent         Comments: pt reports she used furniture for balance     Hand Dominance        Extremity/Trunk Assessment   Upper Extremity Assessment: Defer to OT evaluation           Lower Extremity Assessment: Generalized weakness      Cervical / Trunk Assessment: Normal  Communication   Communication: No difficulties  Cognition     Overall Cognitive Status: Within Functional Limits for tasks assessed                      General Comments    Pt educated on exercises to keep up LE strength when in bed  Exercises General Exercises - Lower Extremity Ankle Circles/Pumps: AROM;Both;20 reps Heel Slides: AROM;Both;10 reps      Assessment/Plan    PT Assessment Patient needs continued  PT services  PT Diagnosis Difficulty walking;Generalized weakness;Acute pain   PT Problem List Decreased strength;Decreased range of motion;Decreased activity tolerance;Decreased balance;Decreased mobility;Decreased knowledge of precautions;Decreased knowledge of use of DME;Pain  PT Treatment Interventions DME instruction;Gait training;Stair training;Functional mobility training;Therapeutic activities;Therapeutic exercise;Patient/family education;Balance training;Modalities   PT Goals (Current goals can be  found in the Care Plan section) Acute Rehab PT Goals Patient Stated Goal: go home PT Goal Formulation: With patient Time For Goal Achievement: 07/14/15 Potential to Achieve Goals: Good    Frequency Min 3X/week   Barriers to discharge Decreased caregiver support pt has no family/friends able to assist at home    Co-evaluation               End of Session Equipment Utilized During Treatment: Other (comment) (sling) Activity Tolerance: Patient limited by fatigue Patient left: in bed;with call bell/phone within reach;with family/visitor present      Functional Assessment Tool Used: clinical observation Functional Limitation: Mobility: Walking and moving around Mobility: Walking and Moving Around Current Status (Y7741): At least 40 percent but less than 60 percent impaired, limited or restricted Mobility: Walking and Moving Around Goal Status [phone]): At least 1 percent but less than 20 percent impaired, limited or restricted    Time: 1000-1017 PT Time Calculation (min) (ACUTE ONLY): 17 min   Charges:   PT Evaluation $Initial PT Evaluation Tier I: 1 Procedure     PT G Codes:   PT G-Codes **NOT FOR INPATIENT CLASS** Functional Assessment Tool Used: clinical observation Functional Limitation: Mobility: Walking and moving around Mobility: Walking and Moving Around Current Status (V6720): At least 40 percent but less than 60 percent impaired, limited or restricted Mobility: Walking and Moving Around Goal Status [phone]): At least 1 percent but less than 20 percent impaired, limited or restricted    Amber Wu 07/07/2015, 10:18 AM

## 2015-07-07 NOTE — Op Note (Signed)
Amber Wu, Amber Wu NO.:  [id number]  MEDICAL RECORD NO.:  [id number]  LOCATION:  5N09C                        FACILITY:  [facility]  PHYSICIAN:  Amber Wu, M.D.  DATE OF BIRTH:  1941-05-15  DATE OF PROCEDURE:  07/06/2015 DATE OF DISCHARGE:                              OPERATIVE REPORT   PREOPERATIVE DIAGNOSIS:  Severely displaced 2-part right proximal humerus fracture.  POSTOPERATIVE DIAGNOSIS:  Severely displaced 2-part right proximal humerus fracture.  PROCEDURE:  Open reduction and internal fixation of a severely displaced right 2-part proximal humerus fracture.  SURGEON:  Amber Wu, M.D.  Amber DupontOlivia Mackie A. [name], PA-C  ANESTHESIA:  General endotracheal as well as an interscalene block.  ESTIMATED BLOOD LOSS:  150 mL.  DRAINS:  None.  HISTORY:  Amber Wu is a 74 year old female, who approximately 2 weeks ago sustained a displaced right proximal humerus fracture with subsequent radiographs showing further displacement and obvious severe malalignment.  She is brought to the operating room at this time for planned open reduction and internal fixation.  Preoperatively, I counseled Amber Wu regarding treatment options and potential risks versus benefits thereof.  Possible complications were reviewed including bleeding, infection, neurovascular injury, malunion, nonunion, loss of fixation, and possible need for additional surgery. She understands and accepts and agrees with our planned procedure.  PROCEDURE IN DETAIL:  After undergoing routine preop evaluation, the patient received prophylactic antibiotics.  An interscalene block was established in the holding area by the Anesthesia Department.  Placed supine on the operating table, underwent smooth induction of a general endotracheal anesthesia.  Placed into beach-chair position and appropriately padded and protected.  We did obtain preoperative fluoroscopic imaging, which confirmed  that all aspects of the shoulder could be properly visualized and confirmed the severe degree of displacement.  At this point, the right shoulder girdle region was then sterilely prepped and draped in standard fashion.  Time-out was called. An anterior deltopectoral approach to the right shoulder made through a 12 cm anterior incision.  Skin flaps were elevated.  Dissection carried deeply.  Electrocautery was used for hemostasis.  The deltopectoral interval was then developed from proximal to distal with cephalic vein taken laterally.  The upper centimeter of the pectoralis major was tenotomized to enhance exposure and also divided the subperiosteal dissection on the humeral shaft anterolaterally for the placement of our plate.  Conjoined tendon was mobilized and retracted medially.  This was divided beneath the deltoid.  We then gained access to the fracture site and this was with some considerable difficulty due to the degree of displacement and the length of time since the fracture where some early healing that had developed.  We introduced a Cobb elevator into the fracture site, carefully and sequentially mobilized the fracture fragments and gained proper realignment of the fracture site and positioned the humeral shaft beneath the humeral head appropriately. Once we achieved this to our satisfaction, we then took the appropriate 3-hole plate from the [employer] proximal humeral fracture plate system and provisionally placed this over the lateral aspect of the proximal humerus.  We used fluoroscopic imaging to confirm the overall alignment and positioning as to our satisfaction.  A guidepin was placed into the "center-center" position to the humeral head.  Once this was achieved, we then placed a provisional screw distally on the humeral shaft.  We then sequentially filled the pegs up into the humeral head and then finished transfixion to the humeral shaft.  Once all the hardware had been  placed, we then used live fluoro imaging to move the shoulder, which showed good stability and confirmed that all of the pegs were contained within the humeral head with proper position of the hardware. At this point, the incision and shoulder were then copiously irrigated. Hemostasis was obtained.  The deltopectoral interval was reapproximated with a series of figure-of-eight #1 Vicryl sutures.  2-0 Vicryl used for the subcu layer and intracuticular 3-0 Monocryl for the skin, followed by Dermabond and Aquacel.  The right arm was placed into a sling.  The patient was awakened, extubated, and taken to the recovery room in stable condition.  Amber Loges, PA-C was used as an Environmental consultant throughout this case, essential for help with positioning of the patient, positioning of the extremity, manipulation of the fracture fragments, wound closure, and intraoperative decision making.     Amber Clines. Amber Wu, M.D.     KMS/MEDQ  D:  07/06/2015  T:  07/07/2015  Job:  [id number]

## 2015-07-07 NOTE — Clinical Social Work Note (Signed)
Clinical Social Work Assessment  Patient Details  Name: Amber Wu MRN: [id number] Date of Birth: 1941-09-29  Date of referral:  07/07/15               Reason for consult:  Facility Placement, Discharge Planning                Permission sought to share information with:  Chartered certified accountant granted to share information::  Yes, Verbal Permission Granted  Name::     n/a  Agency::  [facility]  Relationship::  n/a  Contact Information:  n/a  Housing/Transportation Living arrangements for the past 2 months:  [place] of Information:  Patient Patient Interpreter Needed:  None Criminal Activity/Legal Involvement Pertinent to Current Situation/Hospitalization:  No - Comment as needed Significant Relationships:  None Lives with:  Self Do you feel safe going back to the place where you live?  Yes (Patient prefers to discharge home.) Need for family participation in patient care:  No (Coment) (Patient able to make own decisions.)  Care giving concerns:  Patient expressed desire to discharge home rather than SNF.   Social Worker assessment / plan:  CSW received referral for possible SNF placement at time of discharge. CSW met with patient at bedside to discuss discharge planning needs. Per patient, patient lives alone after losing patient's husband and son within a year of one another. CSW offered support to patient. Patient prefers to discharge home, but is open to [place] completing a SNF search. CSW to continue to follow and assist with discharge planning needs.  Employment status:  Retired Forensic scientist:  Medicare PT Recommendations:  [place] / Referral to community resources:  [place]  Patient/Family's Response to care:  Patient understanding and agreeable to CSW plan of care.  Patient/Family's Understanding of and Emotional Response to Diagnosis, Current Treatment, and  Prognosis:  Patient understanding and agreeable to CSW plan of care.  Emotional Assessment Appearance:  Appears stated age Attitude/Demeanor/Rapport:  Other (Pleasant) Affect (typically observed):  Accepting, Apprehensive, Pleasant Orientation:  Oriented to Self, Oriented to Place, Oriented to  Time, Oriented to Situation Alcohol / Substance use:  Not Applicable Psych involvement (Current and /or in the community):  No (Comment) (Not appropriate on this admission.)  Discharge Needs  Concerns to be addressed:  No discharge needs identified Readmission within the last 30 days:  No Current discharge risk:  None Barriers to Discharge:  No Barriers Identified   Caroline Sauger, LCSW 07/07/2015, 3:13 PM [phone]

## 2015-07-08 NOTE — Clinical Social Work Placement (Signed)
   CLINICAL SOCIAL WORK PLACEMENT  NOTE  Date:  07/08/2015  Patient Details  Name: Amber Wu MRN: [id number] Date of Birth: [birth year]  Clinical Social Work is seeking post-discharge placement for this patient at the [place] level of care (*CSW will initial, date and re-position this form in  chart as items are completed):  Yes   Patient/family provided with [facility] Work Department's list of facilities offering this level of care within the geographic area requested by the patient (or if unable, by the patient's family).  Yes   Patient/family informed of their freedom to choose among providers that offer the needed level of care, that participate in Medicare, Medicaid or managed care program needed by the patient, have an available bed and are willing to accept the patient.  Yes   Patient/family informed of [facility]'s ownership interest in [facility], [place] and [facility], as well as of the fact that they are under no obligation to receive care at these facilities.  PASRR submitted to EDS on 07/07/15     PASRR number received on 07/07/15     Existing PASRR number confirmed on  (n/a)     FL2 transmitted to all facilities in geographic area requested by pt/family on 07/07/15     FL2 transmitted to all facilities within larger geographic area on 07/07/15     Patient informed that his/her managed care company has contracts with or will negotiate with certain facilities, including the following:   (yes, [facility] and [facility])     Yes   Patient/family informed of bed offers received.  Patient chooses bed at       Physician recommends and patient chooses bed at      Patient to be transferred to   on  .  Patient to be transferred to facility by       Patient family notified on   of transfer.  Name of family member notified:        PHYSICIAN Please sign FL2     Additional Comment:     _______________________________________________ Berton Mount, LCSWA Weekend CSW [phone]

## 2015-07-08 NOTE — Progress Notes (Signed)
Amber Wu  MRN: [id number] DOB/Age: [birth year] 75 y.o. Physician: Ander Slade, M.D. 2 Days Post-Op Procedure(s) (LRB): OPEN REDUCTION INTERNAL FIXATION (ORIF) RIGHT PROXIMAL HUMERUS FRACTURE (Right)  Subjective: Rested well last night, reports awakening this am with minimal discomfort and no pain med required Vital Signs Temp:  [98.6 F (37 C)-99.9 F (37.7 C)] 98.6 F (37 C) (07/30 0631) Pulse Rate:  [83-105] 102 (07/30 0631) Resp:  [18-20] 20 (07/30 0631) BP: (113-120)/(44-59) 117/59 mmHg (07/30 0631) SpO2:  [91 %-94 %] 94 % (07/30 0631)  Lab Results  Recent Labs  07/06/15 1225  WBC 9.4  HGB 12.4  HCT 37.7  PLT 369   BMET  Recent Labs  07/06/15 1225  NA 137  K 3.8  CL 97*  CO2 27  GLUCOSE 113*  BUN 11  CREATININE 1.16*  CALCIUM 9.0   INR  Date Value Ref Range Status  07/06/2015 1.17 0.00 - 1.49 Final     Exam  Dressing dry, intact to light touch axillary N distribution, N/V intact distally RUE  Plan Discussed discharge plan with patient and CSW. Will likely need SNF and arrangements being made. OOB with nursing, ambulate with PT/OT   Valissa Lyvers M 07/08/2015, 9:28 AM    Contact # [phone]

## 2015-07-08 NOTE — Progress Notes (Signed)
Occupational Therapy Treatment Patient Details Name: Amber Wu MRN: [id number] DOB: December 13, 1940 Today's Date: 07/08/2015    History of present illness s/p fall sustaining a OPEN REDUCTION INTERNAL FIXATION (ORIF) RIGHT PROXIMAL HUMERUS FRACTURE   OT comments  This 74 yo female admitted with above presents to acute OT making progress towards goals slowly. She will continued to benefit from acute OT with follow up OT at SNF.  Follow Up Recommendations  SNF;Supervision/Assistance - 24 hour    Equipment Recommendations  3 in 1 bedside comode       Precautions / Restrictions Precautions Precautions: Shoulder Type of Shoulder Precautions: passive protocol; 0-60 Abd; gentle pendulums OK; AROM elbow/wrist/hand Shoulder Interventions: Shoulder sling/immobilizer;Off for dressing/bathing/exercises Required Braces or Orthoses: Sling Restrictions Weight Bearing Restrictions: Yes RUE Weight Bearing: Non weight bearing       Mobility Bed Mobility               General bed mobility comments: Pt up in recliner upon arrival  Transfers Overall transfer level: Needs assistance Equipment used: 1 person hand held assist Transfers: Sit to/from Stand;Stand Pivot Transfers Sit to Stand: Min assist Stand pivot transfers: Min assist                                Cognition   Behavior During Therapy: [facility] for tasks assessed/performed Overall Cognitive Status: Within Functional Limits for tasks assessed                         Exercises Pendulum exercises (written home exercise program): Maximal assistance (for technique--10 reps of each) ROM for elbow, wrist and digits of operated UE: Minimal assistance (10 reps each)   Shoulder Instructions Shoulder Instructions Pendulum exercises (written home exercise program): Maximal assistance (for technique--10 reps of each) ROM for elbow, wrist and digits of operated UE: Minimal assistance (10 reps each)           Pertinent Vitals/ Pain       Pain Assessment: 0-10 Pain Score: 3  Pain Location: right shoulder Pain Descriptors / Indicators: Aching;Sore Pain Intervention(s): Monitored during session;Repositioned         Frequency Min 3X/week     Progress Toward Goals  OT Goals(current goals can now be found in the care plan section)  Progress towards OT goals: Progressing toward goals     Plan Discharge plan remains appropriate       End of Session     Activity Tolerance Patient tolerated treatment well   Patient Left in chair;with call bell/phone within reach   Nurse Communication          Time: 1151-1210 OT Time Calculation (min): 19 min  Charges: OT General Charges $OT Visit: 1 Procedure OT Treatments $Therapeutic Exercise: 8-22 mins  Almon Register [phone] 07/08/2015, 12:45 PM

## 2015-07-08 NOTE — Progress Notes (Signed)
PT Cancellation Note  Patient Details Name: Amber Wu MRN: [id number] DOB: 1941-06-19   Cancelled Treatment:    Reason Eval/Treat Not Completed: Fatigue/lethargy limiting ability to participate.  Patient declined PT this pm, reporting "I already walked with one of you" (OT worked with patient earlier).  Will return at later time for PT session.   Despina Pole 07/08/2015, 3:20 PM Carita Pian Sanjuana Kava, [place] Pager [phone]

## 2015-07-09 NOTE — Progress Notes (Signed)
    Subjective: 3 Days Post-Op Procedure(s) (LRB): OPEN REDUCTION INTERNAL FIXATION (ORIF) RIGHT PROXIMAL HUMERUS FRACTURE (Right) Patient reports pain as 3 on 0-10 scale.   Denies CP or SOB.  Voiding without difficulty. Positive flatus. Objective: Vital signs in last 24 hours: Temp:  [97.7 F (36.5 C)-99 F (37.2 C)] 97.7 F (36.5 C) (07/31 0520) Pulse Rate:  [75-96] 84 (07/31 0520) Resp:  [18-19] 18 (07/31 0520) BP: (107-127)/(40-59) 119/59 mmHg (07/31 0520) SpO2:  [95 %-96 %] 95 % (07/31 0520)  Intake/Output from previous day: 07/30 0701 - 07/31 0700 In: 480 [P.O.:480] Out: -  Intake/Output this shift:    Labs:  Recent Labs  07/06/15 1225  HGB 12.4    Recent Labs  07/06/15 1225  WBC 9.4  RBC 4.13  HCT 37.7  PLT 369    Recent Labs  07/06/15 1225  NA 137  K 3.8  CL 97*  CO2 27  BUN 11  CREATININE 1.16*  GLUCOSE 113*  CALCIUM 9.0    Recent Labs  07/06/15 1225  INR 1.17    Physical Exam: Neurologically intact Intact pulses distally Incision: dressing C/D/I Compartment soft  Assessment/Plan: 3 Days Post-Op Procedure(s) (LRB): OPEN REDUCTION INTERNAL FIXATION (ORIF) RIGHT PROXIMAL HUMERUS FRACTURE (Right) Advance diet Up with therapy Discharge to SNF when bed ready  Kizzy Olafson D for Dr. Melina Schools [facility] [phone] 07/09/2015, 9:20 AM

## 2015-07-09 NOTE — Progress Notes (Signed)
Occupational Therapy Treatment Patient Details Name: Amber Wu MRN: [id number] DOB: November 14, 1941 Today's Date: 07/09/2015    History of present illness s/p fall sustaining a OPEN REDUCTION INTERNAL FIXATION (ORIF) RIGHT PROXIMAL HUMERUS FRACTURE   OT comments  Pt progressing. Education provided in session. Performed RUE exercises.  Follow Up Recommendations  SNF;Supervision/Assistance - 24 hour    Equipment Recommendations  3 in 1 bedside comode    Recommendations for Other Services      Precautions / Restrictions Precautions Precautions: Shoulder Type of Shoulder Precautions: passive protocol; 0-60 Abd; gentle pendulums OK; AROM elbow/wrist/hand; no pushing, pulling, lifting with RUE (can use to hold light items) Shoulder Interventions: Shoulder sling/immobilizer;Off for dressing/bathing/exercises;At all times Precaution Comments: educated on shoulder precautions Required Braces or Orthoses: Sling Restrictions Weight Bearing Restrictions: Yes RUE Weight Bearing: Non weight bearing       Mobility Bed Mobility Overal bed mobility: Needs Assistance Bed Mobility: Supine to Sit;Sit to Supine     Supine to sit: Min assist Sit to supine: Supervision   General bed mobility comments: assist with trunk to come to sitting position  Transfers Overall transfer level: Needs assistance   Transfers: Sit to/from Stand Sit to Stand: Min guard              Balance  Min guard for ambulation.                                 ADL Overall ADL's : Needs assistance/impaired                         Toilet Transfer: Min guard;Ambulation (bed)           Functional mobility during ADLs: Min guard        Vision                     Perception     Praxis      Cognition  Awake/Alert Behavior During Therapy: WFL for tasks assessed/performed Overall Cognitive Status: Within Functional Limits for tasks assessed       Memory: Decreased  short-term memory;Decreased recall of precautions               Extremity/Trunk Assessment               Exercises Shoulder Exercises Elbow Flexion: AROM;Right;15 reps;Supine Elbow Extension: AROM;Right;15 reps;Supine Wrist Flexion: AROM;Right;15 reps;Supine Wrist Extension: Right;AROM;15 reps;Supine Digit Composite Flexion: AROM;Right;15 reps;Supine Composite Extension: AROM;Right;15 reps;Supine Other Exercises Other Exercises: performed RUE pendulum exercises standing  Donning/doffing sling/immobilizer: Maximal assistance Correct positioning of sling/immobilizer: Maximal assistance Pendulum exercises (written home exercise program): Maximal assistance ROM for elbow, wrist and digits of operated UE: Minimal assistance Sling wearing schedule (on at all times/off for ADL's):  (educated) Positioning of UE while sleeping:  (educated)   Shoulder Instructions Shoulder Instructions Donning/doffing sling/immobilizer: Maximal assistance Correct positioning of sling/immobilizer: Maximal assistance Pendulum exercises (written home exercise program): Maximal assistance ROM for elbow, wrist and digits of operated UE: Minimal assistance (OT assisted at times for wrist ROM to demonstrate) Sling wearing schedule (on at all times/off for ADL's):  (educated) Positioning of UE while sleeping:  (educated)     General Comments      Pertinent Vitals/ Pain       Pain Assessment: 0-10 Pain Score:  (3 at beginning and 5 at end) Pain Location: Rt shoulder Pain Descriptors /  Indicators: Throbbing Pain Intervention(s): Monitored during session;Repositioned  Home Living                                          Prior Functioning/Environment              Frequency Min 3X/week     Progress Toward Goals  OT Goals(current goals can now be found in the care plan section)  Progress towards OT goals: Progressing toward goals  Acute Rehab OT Goals Patient Stated  Goal: not stated OT Goal Formulation: With patient Time For Goal Achievement: 07/21/15 Potential to Achieve Goals: Good ADL Goals Pt Will Perform Upper Body Bathing: with min assist;sitting Pt Will Perform Upper Body Dressing: with min assist;sitting Pt Will Transfer to Toilet: with supervision;ambulating Pt Will Perform Toileting - Clothing Manipulation and hygiene: with min guard assist;sit to/from stand;with adaptive equipment Additional ADL Goal #1: Pt will verbalize understanding of shoulder protocol for RUE and HEP  Plan Discharge plan remains appropriate    Co-evaluation                 End of Session Equipment Utilized During Treatment: Gait belt;Other (comment) (sling)   Activity Tolerance Other (comment) (lightheaded, nausea)   Patient Left in bed;with call bell/phone within reach   Nurse Communication          Time: 1478-2956 OT Time Calculation (min): 19 min  Charges: OT General Charges $OT Visit: 1 Procedure OT Treatments $Therapeutic Exercise: 8-22 mins   Benito Mccreedy OTR/L [phone] 07/09/2015, 2:11 PM

## 2015-07-10 DIAGNOSIS — I480 Paroxysmal atrial fibrillation: Secondary | ICD-10-CM | POA: Diagnosis not present

## 2015-07-10 DIAGNOSIS — S42221G 2-part displaced fracture of surgical neck of right humerus, subsequent encounter for fracture with delayed healing: Secondary | ICD-10-CM | POA: Diagnosis not present

## 2015-07-10 DIAGNOSIS — E034 Atrophy of thyroid (acquired): Secondary | ICD-10-CM | POA: Diagnosis not present

## 2015-07-10 DIAGNOSIS — I071 Rheumatic tricuspid insufficiency: Secondary | ICD-10-CM | POA: Diagnosis not present

## 2015-07-10 DIAGNOSIS — K219 Gastro-esophageal reflux disease without esophagitis: Secondary | ICD-10-CM | POA: Diagnosis not present

## 2015-07-10 DIAGNOSIS — S42291D Other displaced fracture of upper end of right humerus, subsequent encounter for fracture with routine healing: Secondary | ICD-10-CM | POA: Diagnosis not present

## 2015-07-10 DIAGNOSIS — R2681 Unsteadiness on feet: Secondary | ICD-10-CM | POA: Diagnosis not present

## 2015-07-10 DIAGNOSIS — E785 Hyperlipidemia, unspecified: Secondary | ICD-10-CM | POA: Diagnosis not present

## 2015-07-10 DIAGNOSIS — Z7982 Long term (current) use of aspirin: Secondary | ICD-10-CM | POA: Diagnosis not present

## 2015-07-10 DIAGNOSIS — K5900 Constipation, unspecified: Secondary | ICD-10-CM | POA: Diagnosis not present

## 2015-07-10 DIAGNOSIS — Z967 Presence of other bone and tendon implants: Secondary | ICD-10-CM | POA: Diagnosis not present

## 2015-07-10 DIAGNOSIS — E039 Hypothyroidism, unspecified: Secondary | ICD-10-CM | POA: Diagnosis not present

## 2015-07-10 DIAGNOSIS — E038 Other specified hypothyroidism: Secondary | ICD-10-CM | POA: Diagnosis not present

## 2015-07-10 DIAGNOSIS — D62 Acute posthemorrhagic anemia: Secondary | ICD-10-CM | POA: Diagnosis not present

## 2015-07-10 DIAGNOSIS — S42309A Unspecified fracture of shaft of humerus, unspecified arm, initial encounter for closed fracture: Secondary | ICD-10-CM | POA: Diagnosis not present

## 2015-07-10 DIAGNOSIS — I10 Essential (primary) hypertension: Secondary | ICD-10-CM | POA: Diagnosis not present

## 2015-07-10 DIAGNOSIS — F329 Major depressive disorder, single episode, unspecified: Secondary | ICD-10-CM | POA: Diagnosis not present

## 2015-07-10 DIAGNOSIS — J449 Chronic obstructive pulmonary disease, unspecified: Secondary | ICD-10-CM | POA: Diagnosis not present

## 2015-07-10 DIAGNOSIS — M1A09X0 Idiopathic chronic gout, multiple sites, without tophus (tophi): Secondary | ICD-10-CM | POA: Diagnosis not present

## 2015-07-10 DIAGNOSIS — Z4789 Encounter for other orthopedic aftercare: Secondary | ICD-10-CM | POA: Diagnosis not present

## 2015-07-10 DIAGNOSIS — K2270 Barrett's esophagus without dysplasia: Secondary | ICD-10-CM | POA: Diagnosis not present

## 2015-07-10 DIAGNOSIS — Z8781 Personal history of (healed) traumatic fracture: Secondary | ICD-10-CM | POA: Diagnosis not present

## 2015-07-10 DIAGNOSIS — I4891 Unspecified atrial fibrillation: Secondary | ICD-10-CM | POA: Diagnosis not present

## 2015-07-10 DIAGNOSIS — M6281 Muscle weakness (generalized): Secondary | ICD-10-CM | POA: Diagnosis not present

## 2015-07-10 DIAGNOSIS — S42201D Unspecified fracture of upper end of right humerus, subsequent encounter for fracture with routine healing: Secondary | ICD-10-CM | POA: Diagnosis not present

## 2015-07-10 DIAGNOSIS — Z9181 History of falling: Secondary | ICD-10-CM | POA: Diagnosis not present

## 2015-07-10 NOTE — Care Management Important Message (Signed)
Important Message  Patient Details  Name: Amber Wu MRN: [id number] Date of Birth: January 05, 1941   Medicare Important Message Given:  Yes-second notification given    Delorse Lek 07/10/2015, 12:14 PM

## 2015-07-10 NOTE — Progress Notes (Signed)
Occupational Therapy Treatment Patient Details Name: EYMI LIPUMA MRN: [id number] DOB: 1941-02-18 Today's Date: 07/10/2015    History of present illness s/p fall sustaining a OPEN REDUCTION INTERNAL FIXATION (ORIF) RIGHT PROXIMAL HUMERUS FRACTURE   OT comments  Reviewed proper sling positioning, pt. Performed recommended ROM to RUE, declined pendulum exercises but reviewed the hand out with pt. And provided demo.    Follow Up Recommendations  SNF;Supervision/Assistance - 24 hour    Equipment Recommendations  3 in 1 bedside comode    Recommendations for Other Services      Precautions / Restrictions Precautions Precautions: Shoulder Type of Shoulder Precautions: passive protocol; 0-60 Abd; gentle pendulums OK; AROM elbow/wrist/hand; no pushing, pulling, lifting with RUE (can use to hold light items) Shoulder Interventions: Shoulder sling/immobilizer;Off for dressing/bathing/exercises;At all times Precaution Comments: educated on shoulder precautions Required Braces or Orthoses: Sling Restrictions RUE Weight Bearing: Non weight bearing       Mobility Bed Mobility                  Transfers                      Balance                                   ADL                                                Vision                     Perception     Praxis      Cognition   Behavior During Therapy: WFL for tasks assessed/performed Overall Cognitive Status: Within Functional Limits for tasks assessed       Memory: Decreased short-term memory;Decreased recall of precautions               Extremity/Trunk Assessment               Exercises Shoulder Exercises Pendulum Exercise:  (not completed this session, reviewed hand out and provided demonstration) Elbow Flexion: AROM;Right;15 reps;Supine Elbow Extension: AROM;Right;15 reps;Supine Wrist Flexion: AROM;Right;15 reps;Supine Wrist Extension:  Right;AROM;15 reps;Supine Digit Composite Flexion: AROM;Right;15 reps;Supine Composite Extension: AROM;Right;15 reps;Supine Donning/doffing sling/immobilizer: Maximal assistance Correct positioning of sling/immobilizer: Maximal assistance Pendulum exercises (written home exercise program):  (not completed this session. pt. had just returned to bed and declined-reviewed handout and provided demo for pt.) ROM for elbow, wrist and digits of operated UE: Minimal assistance   Shoulder Instructions Shoulder Instructions Donning/doffing sling/immobilizer: Maximal assistance Correct positioning of sling/immobilizer: Maximal assistance Pendulum exercises (written home exercise program):  (not completed this session. pt. had just returned to bed and declined-reviewed handout and provided demo for pt.) ROM for elbow, wrist and digits of operated UE: Minimal assistance     General Comments      Pertinent Vitals/ Pain       Pain Assessment: No/denies pain  Home Living                                          Prior Functioning/Environment  Frequency Min 3X/week     Progress Toward Goals  OT Goals(current goals can now be found in the care plan section)  Progress towards OT goals: Progressing toward goals     Plan Discharge plan remains appropriate    Co-evaluation                 End of Session     Activity Tolerance Patient tolerated treatment well   Patient Left     Nurse Communication          Time: 4259-5638 OT Time Calculation (min): 9 min  Charges: OT General Charges $OT Visit: 1 Procedure OT Treatments $Therapeutic Exercise: 8-22 mins  Janice Coffin, COTA/L 07/10/2015, 10:10 AM

## 2015-07-10 NOTE — Discharge Summary (Signed)
PATIENT ID:      Amber Wu  MRN:     [id number] DOB/AGE:    1941-09-18 / 74 y.o.     DISCHARGE SUMMARY  ADMISSION DATE:    07/06/2015 DISCHARGE DATE:    ADMISSION DIAGNOSIS: RIGHT PROXIMAL HUMERUS FRACTURE Past Medical History  Diagnosis Date  . Allergy   . Hypothyroidism   . COPD (chronic obstructive pulmonary disease)   . GERD (gastroesophageal reflux disease)   . Barrett's esophagus   . Hypertension   . Depression   . Arthritis   . History of esophageal stricture   . Atrial fibrillation     2D ECHO, 08/12/2012 - EF >55%, LA moderately dilated, RA moderately dilated, moderate tricuspid valve regurgitation  . Gastroparesis   . Hiatal hernia   . Shortness of breath dyspnea     DISCHARGE DIAGNOSIS:   Active Problems:   Humerus head fracture   Proximal humerus fracture   PROCEDURE: Procedure(s): OPEN REDUCTION INTERNAL FIXATION (ORIF) RIGHT PROXIMAL HUMERUS FRACTURE on 07/06/2015  CONSULTS:     HISTORY:  See H&P in chart.  HOSPITAL COURSE:  JAZZ BIDDY is a 74 y.o. admitted on 07/06/2015 with a chief complaint of severe right shoulder pain following mechanical fall, and found to have a diagnosis of [place]. The fracture went on to displace  They were brought to the operating room on 07/06/2015 and underwent Procedure(s): OPEN REDUCTION INTERNAL FIXATION (ORIF) RIGHT PROXIMAL HUMERUS FRACTURE.    They were given perioperative antibiotics: Anti-infectives    Start     Dose/Rate Route Frequency Ordered Stop   07/06/15 2000  ceFAZolin (ANCEF) IVPB 2 g/50 mL premix     2 g 100 mL/hr over 30 Minutes Intravenous Every 6 hours 07/06/15 1851 07/07/15 0830   07/06/15 1430  ceFAZolin (ANCEF) 3 g in dextrose 5 % 50 mL IVPB     3 g 160 mL/hr over 30 Minutes Intravenous To ShortStay Surgical 07/05/15 1051 07/06/15 1445    .  Patient underwent the above named procedure and tolerated it well. The following day they were hemodynamically stable and pain was  controlled on oral analgesics. They were neurovascularly intact to the operative extremity. OT/PT was ordered and worked with patient per protocol. She was felt to be unsafe to be discharged to home as she lived alone and demonstrated poor capacity and balance. Social work was consulted for short term skilled. The remainder of her stay was uncomplicated and a bed was found. They were medically and orthopaedically stable for discharge on 07/10/15     DIAGNOSTIC STUDIES:  RECENT RADIOGRAPHIC STUDIES :  Dg Chest 2 View  06/22/2015   CLINICAL DATA:  74 year old female status post fall  EXAM: CHEST  2 VIEW  COMPARISON:  Radiograph dated 04/06/2015  FINDINGS: The heart size and mediastinal contours are within normal limits. Both lungs are clear. The visualized skeletal structures are unremarkable.  IMPRESSION: No active cardiopulmonary disease.   Electronically Signed   By: Anner Crete M.D.   On: 06/22/2015 18:48   Dg Shoulder Right  07/06/2015   CLINICAL DATA:  Status post ORIF for a proximal right humeral fracture, reported fluoro time is 2 minutes, 12 seconds.  EXAM: RIGHT SHOULDER - 2+ VIEW; DG C-ARM 61-120 MIN  COMPARISON:  Right shoulder series of June 22, 2015  FINDINGS: The patient has undergone ORIF for a proximal humeral metaphyseal fracture. Positioning of the orthopedic side plate and compression screws is good. The fracture fragments  are in near anatomic alignment with 1 displaced fracture noted along the medial aspect of the neck that is unchanged from the preoperative films. The acetabulum and distal clavicle and acromion are grossly normal.  IMPRESSION: The patient has undergone ORIF for right proximal humeral metaphyseal fracture. There is no evidence of immediate postprocedure complication.   Electronically Signed   By: David  Martinique M.D.   On: 07/06/2015 16:54   Dg Shoulder Right  06/22/2015   CLINICAL DATA:  Patient tripped and fell over luggage  EXAM: RIGHT SHOULDER - 2+ VIEW   COMPARISON:  None.  FINDINGS: Frontal and Y scapular images obtained. There is a comminuted fracture of the proximal humeral metaphysis. On the Y scapular view, there is evidence of displacement of fracture fragments ; this degree of displacement is not appreciable on the frontal view. No dislocation. There is mild generalized osteoarthritic change.  IMPRESSION: Comminuted displaced fracture of the proximal humeral metaphysis. No dislocation.   Electronically Signed   By: Lowella Grip III M.D.   On: 06/22/2015 18:49   Ct Head Wo Contrast  06/22/2015   CLINICAL DATA:  74 year old female status post trauma stop  EXAM: CT HEAD WITHOUT CONTRAST  CT CERVICAL SPINE WITHOUT CONTRAST  TECHNIQUE: Multidetector CT imaging of the head and cervical spine was performed following the standard protocol without intravenous contrast. Multiplanar CT image reconstructions of the cervical spine were also generated.  COMPARISON:  None  FINDINGS: CT HEAD FINDINGS  Evaluation is limited due to streak artifact caused by metallic dental work.  The ventricles and sulci are appropriate in size for patient's age. Mild periventricular and deep white matter hypodensities represent chronic microvascular ischemic changes. There is no intracranial hemorrhage. No mass effect or midline shift identified.  The visualized paranasal sinuses and mastoid air cells are well aerated. The calvarium is intact.  CT CERVICAL SPINE FINDINGS  No acute fracture or subluxation.There is osteopenia with multilevel degenerative changes with facet hypertrophy.The odontoid and spinous processes are intact.There is normal anatomic alignment of the C1-C2 lateral masses. The visualized soft tissues appear unremarkable.  There is mild emphysematous  IMPRESSION: No acute intracranial pathology  No acute cervical spine fracture.   Electronically Signed   By: Anner Crete M.D.   On: 06/22/2015 18:33   Ct Cervical Spine Wo Contrast  06/22/2015   CLINICAL DATA:   74 year old female status post trauma stop  EXAM: CT HEAD WITHOUT CONTRAST  CT CERVICAL SPINE WITHOUT CONTRAST  TECHNIQUE: Multidetector CT imaging of the head and cervical spine was performed following the standard protocol without intravenous contrast. Multiplanar CT image reconstructions of the cervical spine were also generated.  COMPARISON:  None  FINDINGS: CT HEAD FINDINGS  Evaluation is limited due to streak artifact caused by metallic dental work.  The ventricles and sulci are appropriate in size for patient's age. Mild periventricular and deep white matter hypodensities represent chronic microvascular ischemic changes. There is no intracranial hemorrhage. No mass effect or midline shift identified.  The visualized paranasal sinuses and mastoid air cells are well aerated. The calvarium is intact.  CT CERVICAL SPINE FINDINGS  No acute fracture or subluxation.There is osteopenia with multilevel degenerative changes with facet hypertrophy.The odontoid and spinous processes are intact.There is normal anatomic alignment of the C1-C2 lateral masses. The visualized soft tissues appear unremarkable.  There is mild emphysematous  IMPRESSION: No acute intracranial pathology  No acute cervical spine fracture.   Electronically Signed   By: Laren Everts.D.  On: 06/22/2015 18:33   Dg Toe 2nd Right  06/22/2015   CLINICAL DATA:  74 year old female with fall and pain in the right second toe.  EXAM: RIGTH SECOND TOE  COMPARISON:  None.  FINDINGS: No acute fracture or dislocation. There is degenerative changes of the proximal and distal interphalangeal joints with mild chronic appearing lateral subluxation of the toe at the PIP. The soft tissues are grossly unremarkable.  IMPRESSION: No acute fracture or dislocation.   Electronically Signed   By: Anner Crete M.D.   On: 06/22/2015 18:49   Dg C-arm 61-120 Min  07/06/2015   CLINICAL DATA:  Status post ORIF for a proximal right humeral fracture, reported fluoro  time is 2 minutes, 12 seconds.  EXAM: RIGHT SHOULDER - 2+ VIEW; DG C-ARM 61-120 MIN  COMPARISON:  Right shoulder series of June 22, 2015  FINDINGS: The patient has undergone ORIF for a proximal humeral metaphyseal fracture. Positioning of the orthopedic side plate and compression screws is good. The fracture fragments are in near anatomic alignment with 1 displaced fracture noted along the medial aspect of the neck that is unchanged from the preoperative films. The acetabulum and distal clavicle and acromion are grossly normal.  IMPRESSION: The patient has undergone ORIF for right proximal humeral metaphyseal fracture. There is no evidence of immediate postprocedure complication.   Electronically Signed   By: David  Martinique M.D.   On: 07/06/2015 16:54    RECENT VITAL SIGNS:  Patient Vitals for the past 24 hrs:  BP Temp Temp src Pulse Resp SpO2  07/10/15 0408 (!) 123/59 mmHg 97.7 F (36.5 C) Oral 65 19 98 %  07/09/15 2019 (!) 116/49 mmHg 98.4 F (36.9 C) Oral 85 17 96 %  07/09/15 1400 140/71 mmHg 98.6 F (37 C) - 80 16 97 %  .  RECENT EKG RESULTS:    Orders placed or performed in visit on 04/20/15  . EKG 12-Lead    DISCHARGE INSTRUCTIONS:    DISCHARGE MEDICATIONS:     Medication List    STOP taking these medications        ALEVE 220 MG Caps  Generic drug:  Naproxen Sodium     colchicine 0.6 MG tablet     HYDROcodone-acetaminophen 7.5-500 MG per tablet  Commonly known as:  LORTAB     ibuprofen 200 MG tablet  Commonly known as:  ADVIL,MOTRIN      TAKE these medications        allopurinol 100 MG tablet  Commonly known as:  ZYLOPRIM  Take 100 mg by mouth 2 (two) times daily.     aspirin 81 MG tablet  Take 81 mg by mouth daily.     citalopram 20 MG tablet  Commonly known as:  CELEXA  Take 20 mg by mouth daily.     DEXILANT 60 MG capsule  Generic drug:  dexlansoprazole  Take 60 mg by mouth daily.     diazepam 5 MG tablet  Commonly known as:  VALIUM  Take 0.5-1  tablets (2.5-5 mg total) by mouth every 6 (six) hours as needed for muscle spasms or sedation.     famotidine 20 MG tablet  Commonly known as:  PEPCID  Take 20 mg by mouth at bedtime.     furosemide 20 MG tablet  Commonly known as:  LASIX  Take 1 tablet (20 mg total) by mouth daily as needed.     levothyroxine 175 MCG tablet  Commonly known as:  SYNTHROID, LEVOTHROID  Take  150 mcg by mouth daily before breakfast.     METAMUCIL PO  Take 1 Package by mouth daily.     metoprolol succinate 25 MG 24 hr tablet  Commonly known as:  TOPROL-XL  Take 25 mg by mouth 2 (two) times daily.     multivitamin tablet  Take 1 tablet by mouth daily.     oxyCODONE-acetaminophen 5-325 MG per tablet  Commonly known as:  PERCOCET  Take 1-2 tablets by mouth every 4 (four) hours as needed.     PRADAXA 75 MG Caps capsule  Generic drug:  dabigatran  Take 150 mg by mouth every 12 (twelve) hours.     rosuvastatin 10 MG tablet  Commonly known as:  CRESTOR  Take 10 mg by mouth daily.     Vitamin D-3 1000 UNITS Caps  Take 1,000 Units by mouth daily.        FOLLOW UP VISIT:       Follow-up Information    Follow up with Marin Shutter, MD.   Specialty:  Orthopedic Surgery   Why:  call to be seen in 10 -14 days   Contact information:   [address] [place] [address] [place] [address] [phone]       DISCHARGE TO: Skilled  DISPOSITION: Good  DISCHARGE CONDITION:  Festus Barren for Dr. Justice Britain 07/10/2015, 7:35 AM

## 2015-07-12 ENCOUNTER — Non-Acute Institutional Stay (SKILLED_NURSING_FACILITY): Payer: Medicare Other | Admitting: Nurse Practitioner

## 2015-07-12 DIAGNOSIS — K219 Gastro-esophageal reflux disease without esophagitis: Secondary | ICD-10-CM

## 2015-07-12 DIAGNOSIS — S42291D Other displaced fracture of upper end of right humerus, subsequent encounter for fracture with routine healing: Secondary | ICD-10-CM | POA: Diagnosis not present

## 2015-07-12 DIAGNOSIS — I10 Essential (primary) hypertension: Secondary | ICD-10-CM

## 2015-07-12 DIAGNOSIS — E785 Hyperlipidemia, unspecified: Secondary | ICD-10-CM | POA: Diagnosis not present

## 2015-07-12 DIAGNOSIS — E039 Hypothyroidism, unspecified: Secondary | ICD-10-CM | POA: Diagnosis not present

## 2015-07-12 DIAGNOSIS — I480 Paroxysmal atrial fibrillation: Secondary | ICD-10-CM | POA: Diagnosis not present

## 2015-07-12 DIAGNOSIS — K5900 Constipation, unspecified: Secondary | ICD-10-CM

## 2015-07-12 NOTE — Progress Notes (Signed)
Patient ID: Amber Wu, female   DOB: 1941/01/09, 74 y.o.   MRN: [id number]    Nursing Home Location:  [place] of Service: SNF (31)  PCP: Dwan Bolt, MD  Allergies  Allergen Reactions  . Antihistamines, Chlorpheniramine-Type Other (See Comments)    INTENSE SHAKING AND UTI  . Antihistamines, Diphenhydramine-Type Other (See Comments)    INTENSE SHAKING AND UTI  . Antihistamines, Loratadine-Type Other (See Comments)    INTENSE SHAKING AND UTI    Chief Complaint  Patient presents with  . Hospitalization Follow-up    HPI:  Patient is a 74 y.o. female seen today at [facility] for hospital follow up. Pt with a pmh of htn, hypothyroid, COPD, GERD, obestiy, arthritis, a fib and more was found to have right proximal humerus fracture after mechanical fall. Pt was taken to the OR on 07/06/15 for ORIF of fracutre. Pt is now at [facility] for gait and strength training as she lives alone and it was not felt safe for her to dc home at time of dc from hospital. Pt reports she was having trouble with constipation but this has since improved. Pain is adequately controlled on current medications. Pt without complaints or concerns at this time.   Review of Systems:  Review of Systems  Constitutional: Negative for activity change, appetite change, fatigue and unexpected weight change.  HENT: Negative for congestion and hearing loss.   Eyes: Negative.   Respiratory: Negative for cough and shortness of breath.   Cardiovascular: Negative for chest pain, palpitations and leg swelling.  Gastrointestinal: Negative for abdominal pain, diarrhea and constipation.  Genitourinary: Negative for dysuria and difficulty urinating.  Musculoskeletal: Positive for arthralgias (to right arm/shoulder, pain medication effective). Negative for myalgias.  Skin: Negative for color change and wound.  Neurological: Negative for dizziness and weakness.    Psychiatric/Behavioral: Negative for behavioral problems, confusion and agitation.    Past Medical History  Diagnosis Date  . Allergy   . Hypothyroidism   . COPD (chronic obstructive pulmonary disease)   . GERD (gastroesophageal reflux disease)   . Barrett's esophagus   . Hypertension   . Depression   . Arthritis   . History of esophageal stricture   . Atrial fibrillation     2D ECHO, 08/12/2012 - EF >55%, LA moderately dilated, RA moderately dilated, moderate tricuspid valve regurgitation  . Gastroparesis   . Hiatal hernia   . Shortness of breath dyspnea    Past Surgical History  Procedure Laterality Date  . Facial cosmetic surgery  2000  . Abdominal hysterectomy  1970  . Cardioversion  09/14/2012    Procedure: CARDIOVERSION;  Surgeon: Pixie Casino, MD;  Location: [facility] ENDOSCOPY;  Service: Cardiovascular;  Laterality: N/A;  . Cataract extraction Bilateral 06/2014-08/2014  . Colonoscopy    . Knee surgery Right   . Orif humerus fracture Right 07/06/2015    Procedure: OPEN REDUCTION INTERNAL FIXATION (ORIF) RIGHT PROXIMAL HUMERUS FRACTURE;  Surgeon: Justice Britain, MD;  Location: [place];  Service: Orthopedics;  Laterality: Right;   Social History:   reports that she quit smoking about 21 years ago. Her smoking use included Cigarettes. She has a 7.5 pack-year smoking history. She has never used smokeless tobacco. She reports that she does not drink alcohol or use illicit drugs.  Family History  Problem Relation Age of Onset  . Colon cancer Neg Hx     Medications: Patient's Medications  New Prescriptions   No medications  on file  Previous Medications   ALLOPURINOL (ZYLOPRIM) 100 MG TABLET    Take 100 mg by mouth 2 (two) times daily.    ASPIRIN 81 MG TABLET    Take 81 mg by mouth daily.     CHOLECALCIFEROL (VITAMIN D-3) 1000 UNITS CAPS    Take 1,000 Units by mouth daily.   CITALOPRAM (CELEXA) 20 MG TABLET    Take 20 mg by mouth daily.   DABIGATRAN (PRADAXA) 75 MG CAPS    Take  150 mg by mouth every 12 (twelve) hours.    DEXLANSOPRAZOLE (DEXILANT) 60 MG CAPSULE    Take 60 mg by mouth daily.     DIAZEPAM (VALIUM) 5 MG TABLET    Take 0.5-1 tablets (2.5-5 mg total) by mouth every 6 (six) hours as needed for muscle spasms or sedation.   FAMOTIDINE (PEPCID) 20 MG TABLET    Take 20 mg by mouth at bedtime.   FUROSEMIDE (LASIX) 20 MG TABLET    Take 1 tablet (20 mg total) by mouth daily as needed.   LEVOTHYROXINE (SYNTHROID, LEVOTHROID) 175 MCG TABLET    Take 150 mcg by mouth daily before breakfast.    METOPROLOL SUCCINATE (TOPROL-XL) 25 MG 24 HR TABLET    Take 25 mg by mouth 2 (two) times daily.    MULTIPLE VITAMIN (MULTIVITAMIN) TABLET    Take 1 tablet by mouth daily.   OXYCODONE-ACETAMINOPHEN (PERCOCET) 5-325 MG PER TABLET    Take 1-2 tablets by mouth every 4 (four) hours as needed.   PSYLLIUM (METAMUCIL PO)    Take 1 Package by mouth daily.   ROSUVASTATIN (CRESTOR) 10 MG TABLET    Take 10 mg by mouth daily.    Modified Medications   No medications on file  Discontinued Medications   No medications on file     Physical Exam: Filed Vitals:   07/12/15 1342  BP: 141/72  Pulse: 90  Temp: 97.2 F (36.2 C)  Resp: 20    Physical Exam  Constitutional: She is oriented to person, place, and time. She appears well-developed and well-nourished. No distress.  Obese female  HENT:  Head: Normocephalic and atraumatic.  Mouth/Throat: Oropharynx is clear and moist. No oropharyngeal exudate.  Eyes: Conjunctivae are normal. Pupils are equal, round, and reactive to light.  Neck: Normal range of motion. Neck supple.  Cardiovascular: Normal rate, regular rhythm and normal heart sounds.   Pulmonary/Chest: Effort normal and breath sounds normal.  Abdominal: Soft. Bowel sounds are normal.  Musculoskeletal: She exhibits no edema or tenderness.  Neurological: She is alert and oriented to person, place, and time.  Skin: Skin is warm and dry. She is not diaphoretic.  hydrocolloid  dressing to right shoulder incision.   Psychiatric: She has a normal mood and affect.    Labs reviewed: Basic Metabolic Panel:  Recent Labs  03/20/15 1507 05/15/15 1208 07/06/15 1225  NA 143  --  137  K 5.1  --  3.8  CL 105  --  97*  CO2 25  --  27  GLUCOSE 104*  --  113*  BUN 13  --  11  CREATININE 0.98 2.09* 1.16*  CALCIUM 9.3  --  9.0   Liver Function Tests:  Recent Labs  07/06/15 1225  AST 24  ALT 12*  ALKPHOS 124  BILITOT 0.8  PROT 6.5  ALBUMIN 3.4*   No results for input(s): LIPASE, AMYLASE in the last 8760 hours. No results for input(s): AMMONIA in the last 8760 hours.  CBC:  Recent Labs  07/06/15 1225  WBC 9.4  NEUTROABS 6.1  HGB 12.4  HCT 37.7  MCV 91.3  PLT 369   TSH:  Recent Labs  03/22/15 1346  TSH 0.087*   A1C: Lab Results  Component Value Date   HGBA1C 5.9* 05/01/2015   Lipid Panel: No results for input(s): CHOL, HDL, LDLCALC, TRIG, CHOLHDL, LDLDIRECT in the last 8760 hours.  Radiological Exams: Dg Shoulder Right  07/06/2015   CLINICAL DATA:  Status post ORIF for a proximal right humeral fracture, reported fluoro time is 2 minutes, 12 seconds.  EXAM: RIGHT SHOULDER - 2+ VIEW; DG C-ARM 61-120 MIN  COMPARISON:  Right shoulder series of June 22, 2015  FINDINGS: The patient has undergone ORIF for a proximal humeral metaphyseal fracture. Positioning of the orthopedic side plate and compression screws is good. The fracture fragments are in near anatomic alignment with 1 displaced fracture noted along the medial aspect of the neck that is unchanged from the preoperative films. The acetabulum and distal clavicle and acromion are grossly normal.  IMPRESSION: The patient has undergone ORIF for right proximal humeral metaphyseal fracture. There is no evidence of immediate postprocedure complication.   Electronically Signed   By: David  Martinique M.D.   On: 07/06/2015 16:54   Dg C-arm 61-120 Min  07/06/2015   CLINICAL DATA:  Status post ORIF for a  proximal right humeral fracture, reported fluoro time is 2 minutes, 12 seconds.  EXAM: RIGHT SHOULDER - 2+ VIEW; DG C-ARM 61-120 MIN  COMPARISON:  Right shoulder series of June 22, 2015  FINDINGS: The patient has undergone ORIF for a proximal humeral metaphyseal fracture. Positioning of the orthopedic side plate and compression screws is good. The fracture fragments are in near anatomic alignment with 1 displaced fracture noted along the medial aspect of the neck that is unchanged from the preoperative films. The acetabulum and distal clavicle and acromion are grossly normal.  IMPRESSION: The patient has undergone ORIF for right proximal humeral metaphyseal fracture. There is no evidence of immediate postprocedure complication.   Electronically Signed   By: David  Martinique M.D.   On: 07/06/2015 16:54  Assessment/Plan 1. Humerus head fracture, right, with routine healing, subsequent encounter S/p ORIF, at [facility] for short term therapy -pain controlled on current regimen  2. Paroxysmal atrial fibrillation -rate controlled, conts on metoprolol and pradaxa  3. Hypothyroidism, unspecified hypothyroidism type -conts on outpatient levothyrozine 175 mcg  4. Gastroesophageal reflux disease without esophagitis Stable, conts on pepcid and dexilant   5. Essential hypertension, benign -stable, conts on metoprolol  6. Constipation, unspecified constipation type -improved with use of fiber, encouraged to increase water intake and mobility. Notify if symptoms worsen.  7. Hyperlipemia -cont on crestor      Jessica K. Harle Battiest  [facility] [phone] 8 am - 5 pm) [phone] (after hours)

## 2015-07-17 ENCOUNTER — Non-Acute Institutional Stay (SKILLED_NURSING_FACILITY): Payer: Medicare Other | Admitting: Internal Medicine

## 2015-07-17 ENCOUNTER — Encounter: Payer: Self-pay | Admitting: Internal Medicine

## 2015-07-17 DIAGNOSIS — E034 Atrophy of thyroid (acquired): Secondary | ICD-10-CM

## 2015-07-17 DIAGNOSIS — E785 Hyperlipidemia, unspecified: Secondary | ICD-10-CM

## 2015-07-17 DIAGNOSIS — I480 Paroxysmal atrial fibrillation: Secondary | ICD-10-CM | POA: Diagnosis not present

## 2015-07-17 DIAGNOSIS — S42201D Unspecified fracture of upper end of right humerus, subsequent encounter for fracture with routine healing: Secondary | ICD-10-CM | POA: Diagnosis not present

## 2015-07-17 DIAGNOSIS — F32A Depression, unspecified: Secondary | ICD-10-CM

## 2015-07-17 DIAGNOSIS — E038 Other specified hypothyroidism: Secondary | ICD-10-CM | POA: Diagnosis not present

## 2015-07-17 DIAGNOSIS — K219 Gastro-esophageal reflux disease without esophagitis: Secondary | ICD-10-CM

## 2015-07-17 DIAGNOSIS — D62 Acute posthemorrhagic anemia: Secondary | ICD-10-CM | POA: Diagnosis not present

## 2015-07-17 DIAGNOSIS — M1A09X0 Idiopathic chronic gout, multiple sites, without tophus (tophi): Secondary | ICD-10-CM | POA: Diagnosis not present

## 2015-07-17 DIAGNOSIS — F329 Major depressive disorder, single episode, unspecified: Secondary | ICD-10-CM

## 2015-07-17 NOTE — Progress Notes (Signed)
MRN: [id number] Name: Amber Wu  Sex: female Age: 74 y.o. DOB: 1941/05/24  [place] #: Helene Kelp Facility/Room:211 Level Of Care: SNF Provider: Inocencio Homes D Emergency Contacts: Extended Emergency Contact Information Primary Emergency Contact: Hay,Dale Address: [address] , [place] Phone: [phone] Relation: Sister Secondary Emergency Contact: Agee,Carolyn  [place] Mobile Phone: [phone] Relation: Friend  Code Status:   Allergies: Antihistamines, chlorpheniramine-type; Antihistamines, diphenhydramine-type; and Antihistamines, loratadine-type  Chief Complaint  Patient presents with  . New Admit To SNF    HPI: Patient is 74 y.o. female who sustained a R humerus fx from a ground level fall and underwent an ORIF on 7/28. Pt was admitted to hospital from 7/28-8/1 and is now admitted to SNF for rehab of her arm. While at SNF pt will be treated for her chronic gout with allopurinol, depression with celexa and hypothyroid wirh synthroid.  Past Medical History  Diagnosis Date  . Allergy   . Hypothyroidism   . COPD (chronic obstructive pulmonary disease)   . GERD (gastroesophageal reflux disease)   . Barrett's esophagus   . Hypertension   . Depression   . Arthritis   . History of esophageal stricture   . Atrial fibrillation     2D ECHO, 08/12/2012 - EF >55%, LA moderately dilated, RA moderately dilated, moderate tricuspid valve regurgitation  . Gastroparesis   . Hiatal hernia   . Shortness of breath dyspnea     Past Surgical History  Procedure Laterality Date  . Facial cosmetic surgery  2000  . Abdominal hysterectomy  1970  . Cardioversion  09/14/2012    Procedure: CARDIOVERSION;  Surgeon: Pixie Casino, MD;  Location: [facility] ENDOSCOPY;  Service: Cardiovascular;  Laterality: N/A;  . Cataract extraction Bilateral 06/2014-08/2014  . Colonoscopy    . Knee surgery Right   . Orif humerus fracture Right 07/06/2015     Procedure: OPEN REDUCTION INTERNAL FIXATION (ORIF) RIGHT PROXIMAL HUMERUS FRACTURE;  Surgeon: Justice Britain, MD;  Location: [place];  Service: Orthopedics;  Laterality: Right;      Medication List       This list is accurate as of: 07/17/15 11:59 PM.  Always use your most recent med list.               allopurinol 100 MG tablet  Commonly known as:  ZYLOPRIM  Take 100 mg by mouth 2 (two) times daily.     aspirin 81 MG tablet  Take 81 mg by mouth daily.     citalopram 20 MG tablet  Commonly known as:  CELEXA  Take 20 mg by mouth daily.     DEXILANT 60 MG capsule  Generic drug:  dexlansoprazole  Take 60 mg by mouth daily.     diazepam 5 MG tablet  Commonly known as:  VALIUM  Take 0.5-1 tablets (2.5-5 mg total) by mouth every 6 (six) hours as needed for muscle spasms or sedation.     famotidine 20 MG tablet  Commonly known as:  PEPCID  Take 20 mg by mouth at bedtime.     furosemide 20 MG tablet  Commonly known as:  LASIX  Take 1 tablet (20 mg total) by mouth daily as needed.     levothyroxine 175 MCG tablet  Commonly known as:  SYNTHROID, LEVOTHROID  Take 150 mcg by mouth daily before breakfast.     METAMUCIL PO  Take 1 Package by mouth daily.  metoprolol succinate 25 MG 24 hr tablet  Commonly known as:  TOPROL-XL  Take 25 mg by mouth 2 (two) times daily.     multivitamin tablet  Take 1 tablet by mouth daily.     oxyCODONE-acetaminophen 5-325 MG per tablet  Commonly known as:  PERCOCET  Take 1-2 tablets by mouth every 4 (four) hours as needed.     PRADAXA 75 MG Caps capsule  Generic drug:  dabigatran  Take 150 mg by mouth every 12 (twelve) hours.     rosuvastatin 10 MG tablet  Commonly known as:  CRESTOR  Take 10 mg by mouth daily.     Vitamin D-3 1000 UNITS Caps  Take 1,000 Units by mouth daily.        No orders of the defined types were placed in this encounter.    Immunization History  Administered Date(s) Administered  .  Influenza-Unspecified 08/09/2013, 08/09/2014  . Pneumococcal-Unspecified 12/09/2012    Social History  Substance Use Topics  . Smoking status: Former Smoker -- 0.50 packs/day for 15 years    Types: Cigarettes    Quit date: 11/09/1993  . Smokeless tobacco: Never Used  . Alcohol Use: No    Family history is  None in EPIC and pt says she doesn't know.  Review of Systems  DATA OBTAINED: from patient, nurse GENERAL:  no fevers, fatigue, appetite changes SKIN: No itching, rash or wounds EYES: No eye pain, redness, discharge EARS: No earache, tinnitus, change in hearing NOSE: No congestion, drainage or bleeding  MOUTH/THROAT: No mouth or tooth pain, No sore throat RESPIRATORY: No cough, wheezing, SOB CARDIAC: No chest pain, palpitations, lower extremity edema  GI: No abdominal pain, No N/V/D or constipation, No heartburn or reflux  GU: No dysuria, frequency or urgency, or incontinence  MUSCULOSKELETAL: No unrelieved bone/joint pain NEUROLOGIC: No headache, dizziness or focal weakness PSYCHIATRIC: No c/o anxiety or sadness   Filed Vitals:   07/17/15 1333  BP: 120/59  Pulse: 86  Temp: 97 F (36.1 C)  Resp: 17    SpO2 Readings from Last 1 Encounters:  07/10/15 98%        Physical Exam  GENERAL APPEARANCE: Alert, conversant,  Obese WF, No acute distress.  SKIN: No diaphoresis rash HEAD: Normocephalic, atraumatic  EYES: Conjunctiva/lids clear. Pupils round, reactive. EOMs intact.  EARS: External exam WNL, canals clear. Hearing grossly normal.  NOSE: No deformity or discharge.  MOUTH/THROAT: Lips w/o lesions  RESPIRATORY: Breathing is even, unlabored. Lung sounds are clear   CARDIOVASCULAR: Heart RRR no murmurs, rubs or gallops. 1+ peripheral edema.   GASTROINTESTINAL: Abdomen is soft, non-tender, not distended w/ normal bowel sounds. GENITOURINARY: Bladder non tender, not distended  MUSCULOSKELETAL: R arm imobilized NEUROLOGIC:  Cranial nerves 2-12 grossly intact.  Moves all extremities  PSYCHIATRIC: Mood and affect appropriate to situation, no behavioral issues  Patient Active Problem List   Diagnosis Date Noted  . Gout 07/19/2015  . Proximal humerus fracture 07/07/2015  . Humerus head fracture 07/06/2015  . Severe obesity (BMI >= 40) 06/04/2015  . Hallucinations, visual 04/20/2015  . Weakness 04/20/2015  . Throat mass 03/20/2015  . Vocal tremor 03/20/2015  . DOE (dyspnea on exertion) 03/20/2015  . Mass of arm 06/14/2011  . GASTROPARESIS 09/13/2010  . DYSPHAGIA UNSPECIFIED 09/04/2010  . Hypothyroidism 08/30/2010  . Hyperlipidemia 08/30/2010  . Morbid obesity with BMI of 45.0-49.9, adult 08/30/2010  . Postoperative anemia due to acute blood loss 08/30/2010  . Depression 08/30/2010  . Paroxysmal atrial fibrillation  08/30/2010  . HYPOTENSION 08/30/2010  . GERD 08/30/2010  . BARRETTS ESOPHAGUS 08/30/2010  . PYELONEPHRITIS 08/30/2010  . DEGENERATIVE JOINT DISEASE 08/30/2010  . COLONIC POLYPS, HYPERPLASTIC, HX OF 08/30/2010    CBC    Component Value Date/Time   WBC 9.4 07/06/2015 1225   RBC 4.13 07/06/2015 1225   HGB 12.4 07/06/2015 1225   HCT 37.7 07/06/2015 1225   PLT 369 07/06/2015 1225   MCV 91.3 07/06/2015 1225   LYMPHSABS 2.4 07/06/2015 1225   MONOABS 0.8 07/06/2015 1225   EOSABS 0.1 07/06/2015 1225   BASOSABS 0.0 07/06/2015 1225    CMP     Component Value Date/Time   NA 137 07/06/2015 1225   K 3.8 07/06/2015 1225   CL 97* 07/06/2015 1225   CO2 27 07/06/2015 1225   GLUCOSE 113* 07/06/2015 1225   BUN 11 07/06/2015 1225   CREATININE 1.16* 07/06/2015 1225   CREATININE 0.98 03/20/2015 1507   CALCIUM 9.0 07/06/2015 1225   PROT 6.5 07/06/2015 1225   ALBUMIN 3.4* 07/06/2015 1225   AST 24 07/06/2015 1225   ALT 12* 07/06/2015 1225   ALKPHOS 124 07/06/2015 1225   BILITOT 0.8 07/06/2015 1225   GFRNONAA 45* 07/06/2015 1225   GFRAA 52* 07/06/2015 1225    Lab Results  Component Value Date   HGBA1C 5.9* 05/01/2015      Dg Shoulder Right  07/06/2015   CLINICAL DATA:  Status post ORIF for a proximal right humeral fracture, reported fluoro time is 2 minutes, 12 seconds.  EXAM: RIGHT SHOULDER - 2+ VIEW; DG C-ARM 61-120 MIN  COMPARISON:  Right shoulder series of June 22, 2015  FINDINGS: The patient has undergone ORIF for a proximal humeral metaphyseal fracture. Positioning of the orthopedic side plate and compression screws is good. The fracture fragments are in near anatomic alignment with 1 displaced fracture noted along the medial aspect of the neck that is unchanged from the preoperative films. The acetabulum and distal clavicle and acromion are grossly normal.  IMPRESSION: The patient has undergone ORIF for right proximal humeral metaphyseal fracture. There is no evidence of immediate postprocedure complication.   Electronically Signed   By: David  Martinique M.D.   On: 07/06/2015 16:54   Dg C-arm 61-120 Min  07/06/2015   CLINICAL DATA:  Status post ORIF for a proximal right humeral fracture, reported fluoro time is 2 minutes, 12 seconds.  EXAM: RIGHT SHOULDER - 2+ VIEW; DG C-ARM 61-120 MIN  COMPARISON:  Right shoulder series of June 22, 2015  FINDINGS: The patient has undergone ORIF for a proximal humeral metaphyseal fracture. Positioning of the orthopedic side plate and compression screws is good. The fracture fragments are in near anatomic alignment with 1 displaced fracture noted along the medial aspect of the neck that is unchanged from the preoperative films. The acetabulum and distal clavicle and acromion are grossly normal.  IMPRESSION: The patient has undergone ORIF for right proximal humeral metaphyseal fracture. There is no evidence of immediate postprocedure complication.   Electronically Signed   By: David  Martinique M.D.   On: 07/06/2015 16:54    Not all labs, radiology exams or other studies done during hospitalization come through on my EPIC note; however they are reviewed by me.    Assessment and  Plan  Proximal humerus fracture With eventual displacement ;s/p ORIF; SNF plan - OT/PT, pt already on ASA and pradaxa for prophylaxis  Paroxysmal atrial fibrillation SNF plan - metoprolol for rate and pradaxa and ASA 81 mg as  prophylaxis  GERD SNF plan - cont dexilant 60 mg daily  Hypothyroidism SNF plan - cont synthroid 175 mcg daily  Depression SNF plan - reported stable; cont celexa  Gout SNF plan- cont home allopurinol;controlled  Hyperlipidemia SNF plan - cont home crestor 10 mg  Postoperative anemia due to acute blood loss SNF plan - f/u CBC to ensure stable    Hennie Duos, MD

## 2015-07-18 ENCOUNTER — Telehealth: Payer: Self-pay | Admitting: Internal Medicine

## 2015-07-18 ENCOUNTER — Encounter: Payer: Self-pay | Admitting: Internal Medicine

## 2015-07-19 ENCOUNTER — Encounter: Payer: Self-pay | Admitting: Internal Medicine

## 2015-07-19 DIAGNOSIS — M109 Gout, unspecified: Secondary | ICD-10-CM | POA: Insufficient documentation

## 2015-07-19 NOTE — Assessment & Plan Note (Signed)
SNF plan - cont synthroid 175 mcg daily

## 2015-07-19 NOTE — Assessment & Plan Note (Signed)
SNF plan - cont home crestor 10 mg

## 2015-07-19 NOTE — Assessment & Plan Note (Addendum)
With eventual displacement ;s/p ORIF; SNF plan - OT/PT, pt already on ASA and pradaxa for prophylaxis

## 2015-07-19 NOTE — Assessment & Plan Note (Signed)
SNF plan - f/u CBC to ensure stable

## 2015-07-19 NOTE — Assessment & Plan Note (Signed)
SNF plan - metoprolol for rate and pradaxa and ASA 81 mg as prophylaxis

## 2015-07-19 NOTE — Assessment & Plan Note (Signed)
SNF plan - cont dexilant 60 mg daily

## 2015-07-19 NOTE — Assessment & Plan Note (Signed)
SNF plan - reported stable; cont celexa

## 2015-07-19 NOTE — Assessment & Plan Note (Signed)
SNF plan- cont home allopurinol;controlled

## 2015-07-21 NOTE — Telephone Encounter (Signed)
Close encounter 

## 2015-07-27 ENCOUNTER — Encounter: Payer: Self-pay | Admitting: Internal Medicine

## 2015-07-27 ENCOUNTER — Non-Acute Institutional Stay (SKILLED_NURSING_FACILITY): Payer: Medicare Other | Admitting: Internal Medicine

## 2015-07-27 DIAGNOSIS — E038 Other specified hypothyroidism: Secondary | ICD-10-CM

## 2015-07-27 DIAGNOSIS — D62 Acute posthemorrhagic anemia: Secondary | ICD-10-CM | POA: Diagnosis not present

## 2015-07-27 DIAGNOSIS — E034 Atrophy of thyroid (acquired): Secondary | ICD-10-CM

## 2015-07-27 DIAGNOSIS — F32A Depression, unspecified: Secondary | ICD-10-CM

## 2015-07-27 DIAGNOSIS — E785 Hyperlipidemia, unspecified: Secondary | ICD-10-CM | POA: Diagnosis not present

## 2015-07-27 DIAGNOSIS — F329 Major depressive disorder, single episode, unspecified: Secondary | ICD-10-CM

## 2015-07-27 DIAGNOSIS — K219 Gastro-esophageal reflux disease without esophagitis: Secondary | ICD-10-CM | POA: Diagnosis not present

## 2015-07-27 DIAGNOSIS — I480 Paroxysmal atrial fibrillation: Secondary | ICD-10-CM

## 2015-07-27 DIAGNOSIS — S42201D Unspecified fracture of upper end of right humerus, subsequent encounter for fracture with routine healing: Secondary | ICD-10-CM | POA: Diagnosis not present

## 2015-07-27 DIAGNOSIS — M1A09X0 Idiopathic chronic gout, multiple sites, without tophus (tophi): Secondary | ICD-10-CM

## 2015-07-27 NOTE — Progress Notes (Addendum)
MRN: [id number] Name: Amber Wu  Sex: female Age: 74 y.o. DOB: October 04, 1941  [facility] #: [facility] Facility/Room:211 Level Of Care: SNF Provider: Inocencio Homes D Emergency Contacts: Extended Emergency Contact Information Primary Emergency Contact: Hay,Dale Address: [address] , [place] Phone: [phone] Relation: Sister Secondary Emergency Contact: Agee,Carolyn  [place] Mobile Phone: [phone] Relation: Friend  Code Status:   Allergies: Antihistamines, chlorpheniramine-type; Antihistamines, diphenhydramine-type; and Antihistamines, loratadine-type  Chief Complaint  Patient presents with  . Discharge Note    HPI: Patient is 74 y.o. female who sustained a R humerus fx from a ground level fall and underwent an ORIF on 7/28. Pt was admitted to hospital from 7/28-8/1 and was  admitted to SNF for rehab of her arm. Pt has progressed through rehab and is now ready to be d/c to home.  Past Medical History  Diagnosis Date  . Allergy   . Hypothyroidism   . COPD (chronic obstructive pulmonary disease)   . GERD (gastroesophageal reflux disease)   . Barrett's esophagus   . Hypertension   . Depression   . Arthritis   . History of esophageal stricture   . Atrial fibrillation     2D ECHO, 08/12/2012 - EF >55%, LA moderately dilated, RA moderately dilated, moderate tricuspid valve regurgitation  . Gastroparesis   . Hiatal hernia   . Shortness of breath dyspnea     Past Surgical History  Procedure Laterality Date  . Facial cosmetic surgery  2000  . Abdominal hysterectomy  1970  . Cardioversion  09/14/2012    Procedure: CARDIOVERSION;  Surgeon: Pixie Casino, MD;  Location: [facility] ENDOSCOPY;  Service: Cardiovascular;  Laterality: N/A;  . Cataract extraction Bilateral 06/2014-08/2014  . Colonoscopy    . Knee surgery Right   . Orif humerus fracture Right 07/06/2015    Procedure: OPEN REDUCTION INTERNAL FIXATION (ORIF) RIGHT  PROXIMAL HUMERUS FRACTURE;  Surgeon: Justice Britain, MD;  Location: [place];  Service: Orthopedics;  Laterality: Right;      Medication List       This list is accurate as of: 07/27/15  1:00 PM.  Always use your most recent med list.               allopurinol 100 MG tablet  Commonly known as:  ZYLOPRIM  Take 100 mg by mouth 2 (two) times daily.     aspirin 81 MG tablet  Take 81 mg by mouth daily.     citalopram 20 MG tablet  Commonly known as:  CELEXA  Take 20 mg by mouth daily.     DEXILANT 60 MG capsule  Generic drug:  dexlansoprazole  Take 60 mg by mouth daily.     diazepam 5 MG tablet  Commonly known as:  VALIUM  Take 0.5-1 tablets (2.5-5 mg total) by mouth every 6 (six) hours as needed for muscle spasms or sedation.     famotidine 20 MG tablet  Commonly known as:  PEPCID  Take 20 mg by mouth at bedtime.     furosemide 20 MG tablet  Commonly known as:  LASIX  Take 1 tablet (20 mg total) by mouth daily as needed.     levothyroxine 175 MCG tablet  Commonly known as:  SYNTHROID, LEVOTHROID  Take 150 mcg by mouth daily before breakfast.     METAMUCIL PO  Take 1 Package by mouth daily.     metoprolol succinate 25  MG 24 hr tablet  Commonly known as:  TOPROL-XL  Take 25 mg by mouth 2 (two) times daily.     multivitamin tablet  Take 1 tablet by mouth daily.     oxyCODONE-acetaminophen 5-325 MG per tablet  Commonly known as:  PERCOCET  Take 1-2 tablets by mouth every 4 (four) hours as needed.     PRADAXA 75 MG Caps capsule  Generic drug:  dabigatran  Take 150 mg by mouth every 12 (twelve) hours.     rosuvastatin 10 MG tablet  Commonly known as:  CRESTOR  Take 10 mg by mouth daily.     Vitamin D-3 1000 UNITS Caps  Take 1,000 Units by mouth daily.        No orders of the defined types were placed in this encounter.    Immunization History  Administered Date(s) Administered  . Influenza-Unspecified 08/09/2013, 08/09/2014  . Pneumococcal-Unspecified  12/09/2012    Social History  Substance Use Topics  . Smoking status: Former Smoker -- 0.50 packs/day for 15 years    Types: Cigarettes    Quit date: 11/09/1993  . Smokeless tobacco: Never Used  . Alcohol Use: No    Filed Vitals:   07/27/15 1255  BP: 134/69  Pulse: 74  Temp: 97.5 F (36.4 C)  Resp: 20    Physical Exam  GENERAL APPEARANCE: Alert, conversant. No acute distress.  HEENT: Unremarkable. RESPIRATORY: Breathing is even, unlabored. Lung sounds are clear   CARDIOVASCULAR: Heart RRR no murmurs, rubs or gallops. No peripheral edema.  GASTROINTESTINAL: Abdomen is soft, non-tender, not distended w/ normal bowel sounds.  NEUROLOGIC: Cranial nerves 2-12 grossly intact. Moves all extremities  Patient Active Problem List   Diagnosis Date Noted  . Gout 07/19/2015  . Proximal humerus fracture 07/07/2015  . Humerus head fracture 07/06/2015  . Severe obesity (BMI >= 40) 06/04/2015  . Hallucinations, visual 04/20/2015  . Weakness 04/20/2015  . Throat mass 03/20/2015  . Vocal tremor 03/20/2015  . DOE (dyspnea on exertion) 03/20/2015  . Mass of arm 06/14/2011  . GASTROPARESIS 09/13/2010  . DYSPHAGIA UNSPECIFIED 09/04/2010  . Hypothyroidism 08/30/2010  . Hyperlipidemia 08/30/2010  . Morbid obesity with BMI of 45.0-49.9, adult 08/30/2010  . Postoperative anemia due to acute blood loss 08/30/2010  . Depression 08/30/2010  . Paroxysmal atrial fibrillation 08/30/2010  . HYPOTENSION 08/30/2010  . GERD 08/30/2010  . BARRETTS ESOPHAGUS 08/30/2010  . PYELONEPHRITIS 08/30/2010  . DEGENERATIVE JOINT DISEASE 08/30/2010  . COLONIC POLYPS, HYPERPLASTIC, HX OF 08/30/2010    CBC    Component Value Date/Time   WBC 9.4 07/06/2015 1225   RBC 4.13 07/06/2015 1225   HGB 12.4 07/06/2015 1225   HCT 37.7 07/06/2015 1225   PLT 369 07/06/2015 1225   MCV 91.3 07/06/2015 1225   LYMPHSABS 2.4 07/06/2015 1225   MONOABS 0.8 07/06/2015 1225   EOSABS 0.1 07/06/2015 1225   BASOSABS 0.0  07/06/2015 1225    CMP     Component Value Date/Time   NA 137 07/06/2015 1225   K 3.8 07/06/2015 1225   CL 97* 07/06/2015 1225   CO2 27 07/06/2015 1225   GLUCOSE 113* 07/06/2015 1225   BUN 11 07/06/2015 1225   CREATININE 1.16* 07/06/2015 1225   CREATININE 0.98 03/20/2015 1507   CALCIUM 9.0 07/06/2015 1225   PROT 6.5 07/06/2015 1225   ALBUMIN 3.4* 07/06/2015 1225   AST 24 07/06/2015 1225   ALT 12* 07/06/2015 1225   ALKPHOS 124 07/06/2015 1225   BILITOT 0.8 07/06/2015  [place] 07/06/2015 1225   GFRAA 52* 07/06/2015 1225    Assessment and Plan  Pt is stable for d/c to home with HH/OT/PT and DME- tub seat and 3 in 1 BC. Pt has Rx written for 1 month. Uneventful stay until the last few days when pt was dx with a UTI, > 100,000 enterobacter aerogenes tx with Cipro 500 mg BID for 7 days.  Time spent >30 min > 50% of time with patient was spent reviewing records, labs, tests and studies, counseling and developing plan of care  Hennie Duos, MD

## 2015-07-28 ENCOUNTER — Ambulatory Visit: Payer: Medicare Other | Admitting: Internal Medicine

## 2015-07-28 DIAGNOSIS — Z967 Presence of other bone and tendon implants: Secondary | ICD-10-CM | POA: Diagnosis not present

## 2015-07-28 DIAGNOSIS — Z8781 Personal history of (healed) traumatic fracture: Secondary | ICD-10-CM | POA: Diagnosis not present

## 2015-07-28 DIAGNOSIS — S42221G 2-part displaced fracture of surgical neck of right humerus, subsequent encounter for fracture with delayed healing: Secondary | ICD-10-CM | POA: Diagnosis not present

## 2015-07-28 DIAGNOSIS — Z4789 Encounter for other orthopedic aftercare: Secondary | ICD-10-CM | POA: Diagnosis not present

## 2015-07-30 DIAGNOSIS — I480 Paroxysmal atrial fibrillation: Secondary | ICD-10-CM | POA: Diagnosis not present

## 2015-07-30 DIAGNOSIS — S42291D Other displaced fracture of upper end of right humerus, subsequent encounter for fracture with routine healing: Secondary | ICD-10-CM | POA: Diagnosis not present

## 2015-07-30 DIAGNOSIS — M6281 Muscle weakness (generalized): Secondary | ICD-10-CM | POA: Diagnosis not present

## 2015-07-30 DIAGNOSIS — J449 Chronic obstructive pulmonary disease, unspecified: Secondary | ICD-10-CM | POA: Diagnosis not present

## 2015-07-30 DIAGNOSIS — I071 Rheumatic tricuspid insufficiency: Secondary | ICD-10-CM | POA: Diagnosis not present

## 2015-07-30 DIAGNOSIS — I10 Essential (primary) hypertension: Secondary | ICD-10-CM | POA: Diagnosis not present

## 2015-07-31 DIAGNOSIS — I10 Essential (primary) hypertension: Secondary | ICD-10-CM | POA: Diagnosis not present

## 2015-07-31 DIAGNOSIS — M6281 Muscle weakness (generalized): Secondary | ICD-10-CM | POA: Diagnosis not present

## 2015-07-31 DIAGNOSIS — I480 Paroxysmal atrial fibrillation: Secondary | ICD-10-CM | POA: Diagnosis not present

## 2015-07-31 DIAGNOSIS — J449 Chronic obstructive pulmonary disease, unspecified: Secondary | ICD-10-CM | POA: Diagnosis not present

## 2015-07-31 DIAGNOSIS — S42291D Other displaced fracture of upper end of right humerus, subsequent encounter for fracture with routine healing: Secondary | ICD-10-CM | POA: Diagnosis not present

## 2015-07-31 DIAGNOSIS — I071 Rheumatic tricuspid insufficiency: Secondary | ICD-10-CM | POA: Diagnosis not present

## 2015-08-01 ENCOUNTER — Ambulatory Visit: Payer: Medicare Other | Admitting: Internal Medicine

## 2015-08-03 DIAGNOSIS — I10 Essential (primary) hypertension: Secondary | ICD-10-CM | POA: Diagnosis not present

## 2015-08-03 DIAGNOSIS — J449 Chronic obstructive pulmonary disease, unspecified: Secondary | ICD-10-CM | POA: Diagnosis not present

## 2015-08-03 DIAGNOSIS — I071 Rheumatic tricuspid insufficiency: Secondary | ICD-10-CM | POA: Diagnosis not present

## 2015-08-03 DIAGNOSIS — S42291D Other displaced fracture of upper end of right humerus, subsequent encounter for fracture with routine healing: Secondary | ICD-10-CM | POA: Diagnosis not present

## 2015-08-03 DIAGNOSIS — I480 Paroxysmal atrial fibrillation: Secondary | ICD-10-CM | POA: Diagnosis not present

## 2015-08-03 DIAGNOSIS — M6281 Muscle weakness (generalized): Secondary | ICD-10-CM | POA: Diagnosis not present

## 2015-08-08 DIAGNOSIS — M6281 Muscle weakness (generalized): Secondary | ICD-10-CM | POA: Diagnosis not present

## 2015-08-08 DIAGNOSIS — I071 Rheumatic tricuspid insufficiency: Secondary | ICD-10-CM | POA: Diagnosis not present

## 2015-08-08 DIAGNOSIS — I480 Paroxysmal atrial fibrillation: Secondary | ICD-10-CM | POA: Diagnosis not present

## 2015-08-08 DIAGNOSIS — S42291D Other displaced fracture of upper end of right humerus, subsequent encounter for fracture with routine healing: Secondary | ICD-10-CM | POA: Diagnosis not present

## 2015-08-08 DIAGNOSIS — I10 Essential (primary) hypertension: Secondary | ICD-10-CM | POA: Diagnosis not present

## 2015-08-08 DIAGNOSIS — J449 Chronic obstructive pulmonary disease, unspecified: Secondary | ICD-10-CM | POA: Diagnosis not present

## 2015-08-10 DIAGNOSIS — J449 Chronic obstructive pulmonary disease, unspecified: Secondary | ICD-10-CM | POA: Diagnosis not present

## 2015-08-10 DIAGNOSIS — S42291D Other displaced fracture of upper end of right humerus, subsequent encounter for fracture with routine healing: Secondary | ICD-10-CM | POA: Diagnosis not present

## 2015-08-10 DIAGNOSIS — I480 Paroxysmal atrial fibrillation: Secondary | ICD-10-CM | POA: Diagnosis not present

## 2015-08-10 DIAGNOSIS — I10 Essential (primary) hypertension: Secondary | ICD-10-CM | POA: Diagnosis not present

## 2015-08-10 DIAGNOSIS — I071 Rheumatic tricuspid insufficiency: Secondary | ICD-10-CM | POA: Diagnosis not present

## 2015-08-10 DIAGNOSIS — M6281 Muscle weakness (generalized): Secondary | ICD-10-CM | POA: Diagnosis not present

## 2015-08-11 DIAGNOSIS — S42291D Other displaced fracture of upper end of right humerus, subsequent encounter for fracture with routine healing: Secondary | ICD-10-CM | POA: Diagnosis not present

## 2015-08-11 DIAGNOSIS — J449 Chronic obstructive pulmonary disease, unspecified: Secondary | ICD-10-CM | POA: Diagnosis not present

## 2015-08-11 DIAGNOSIS — I10 Essential (primary) hypertension: Secondary | ICD-10-CM | POA: Diagnosis not present

## 2015-08-11 DIAGNOSIS — I071 Rheumatic tricuspid insufficiency: Secondary | ICD-10-CM | POA: Diagnosis not present

## 2015-08-11 DIAGNOSIS — I480 Paroxysmal atrial fibrillation: Secondary | ICD-10-CM | POA: Diagnosis not present

## 2015-08-11 DIAGNOSIS — M6281 Muscle weakness (generalized): Secondary | ICD-10-CM | POA: Diagnosis not present

## 2015-08-15 DIAGNOSIS — I071 Rheumatic tricuspid insufficiency: Secondary | ICD-10-CM | POA: Diagnosis not present

## 2015-08-15 DIAGNOSIS — M6281 Muscle weakness (generalized): Secondary | ICD-10-CM | POA: Diagnosis not present

## 2015-08-15 DIAGNOSIS — I10 Essential (primary) hypertension: Secondary | ICD-10-CM | POA: Diagnosis not present

## 2015-08-15 DIAGNOSIS — S42291D Other displaced fracture of upper end of right humerus, subsequent encounter for fracture with routine healing: Secondary | ICD-10-CM | POA: Diagnosis not present

## 2015-08-15 DIAGNOSIS — J449 Chronic obstructive pulmonary disease, unspecified: Secondary | ICD-10-CM | POA: Diagnosis not present

## 2015-08-15 DIAGNOSIS — I480 Paroxysmal atrial fibrillation: Secondary | ICD-10-CM | POA: Diagnosis not present

## 2015-08-17 DIAGNOSIS — J449 Chronic obstructive pulmonary disease, unspecified: Secondary | ICD-10-CM | POA: Diagnosis not present

## 2015-08-17 DIAGNOSIS — I10 Essential (primary) hypertension: Secondary | ICD-10-CM | POA: Diagnosis not present

## 2015-08-17 DIAGNOSIS — I480 Paroxysmal atrial fibrillation: Secondary | ICD-10-CM | POA: Diagnosis not present

## 2015-08-17 DIAGNOSIS — S42291D Other displaced fracture of upper end of right humerus, subsequent encounter for fracture with routine healing: Secondary | ICD-10-CM | POA: Diagnosis not present

## 2015-08-17 DIAGNOSIS — I071 Rheumatic tricuspid insufficiency: Secondary | ICD-10-CM | POA: Diagnosis not present

## 2015-08-17 DIAGNOSIS — M6281 Muscle weakness (generalized): Secondary | ICD-10-CM | POA: Diagnosis not present

## 2015-08-18 DIAGNOSIS — S42291D Other displaced fracture of upper end of right humerus, subsequent encounter for fracture with routine healing: Secondary | ICD-10-CM | POA: Diagnosis not present

## 2015-08-18 DIAGNOSIS — I10 Essential (primary) hypertension: Secondary | ICD-10-CM | POA: Diagnosis not present

## 2015-08-18 DIAGNOSIS — J449 Chronic obstructive pulmonary disease, unspecified: Secondary | ICD-10-CM | POA: Diagnosis not present

## 2015-08-18 DIAGNOSIS — M6281 Muscle weakness (generalized): Secondary | ICD-10-CM | POA: Diagnosis not present

## 2015-08-18 DIAGNOSIS — I071 Rheumatic tricuspid insufficiency: Secondary | ICD-10-CM | POA: Diagnosis not present

## 2015-08-18 DIAGNOSIS — I480 Paroxysmal atrial fibrillation: Secondary | ICD-10-CM | POA: Diagnosis not present

## 2015-08-21 DIAGNOSIS — S42291D Other displaced fracture of upper end of right humerus, subsequent encounter for fracture with routine healing: Secondary | ICD-10-CM | POA: Diagnosis not present

## 2015-08-21 DIAGNOSIS — M6281 Muscle weakness (generalized): Secondary | ICD-10-CM | POA: Diagnosis not present

## 2015-08-21 DIAGNOSIS — J449 Chronic obstructive pulmonary disease, unspecified: Secondary | ICD-10-CM | POA: Diagnosis not present

## 2015-08-21 DIAGNOSIS — I480 Paroxysmal atrial fibrillation: Secondary | ICD-10-CM | POA: Diagnosis not present

## 2015-08-21 DIAGNOSIS — I10 Essential (primary) hypertension: Secondary | ICD-10-CM | POA: Diagnosis not present

## 2015-08-21 DIAGNOSIS — I071 Rheumatic tricuspid insufficiency: Secondary | ICD-10-CM | POA: Diagnosis not present

## 2015-08-23 DIAGNOSIS — I10 Essential (primary) hypertension: Secondary | ICD-10-CM | POA: Diagnosis not present

## 2015-08-23 DIAGNOSIS — S42291D Other displaced fracture of upper end of right humerus, subsequent encounter for fracture with routine healing: Secondary | ICD-10-CM | POA: Diagnosis not present

## 2015-08-23 DIAGNOSIS — I071 Rheumatic tricuspid insufficiency: Secondary | ICD-10-CM | POA: Diagnosis not present

## 2015-08-23 DIAGNOSIS — J449 Chronic obstructive pulmonary disease, unspecified: Secondary | ICD-10-CM | POA: Diagnosis not present

## 2015-08-23 DIAGNOSIS — M6281 Muscle weakness (generalized): Secondary | ICD-10-CM | POA: Diagnosis not present

## 2015-08-23 DIAGNOSIS — I480 Paroxysmal atrial fibrillation: Secondary | ICD-10-CM | POA: Diagnosis not present

## 2015-08-24 DIAGNOSIS — I071 Rheumatic tricuspid insufficiency: Secondary | ICD-10-CM | POA: Diagnosis not present

## 2015-08-24 DIAGNOSIS — I10 Essential (primary) hypertension: Secondary | ICD-10-CM | POA: Diagnosis not present

## 2015-08-24 DIAGNOSIS — J449 Chronic obstructive pulmonary disease, unspecified: Secondary | ICD-10-CM | POA: Diagnosis not present

## 2015-08-24 DIAGNOSIS — S42291D Other displaced fracture of upper end of right humerus, subsequent encounter for fracture with routine healing: Secondary | ICD-10-CM | POA: Diagnosis not present

## 2015-08-24 DIAGNOSIS — I480 Paroxysmal atrial fibrillation: Secondary | ICD-10-CM | POA: Diagnosis not present

## 2015-08-24 DIAGNOSIS — M6281 Muscle weakness (generalized): Secondary | ICD-10-CM | POA: Diagnosis not present

## 2015-08-25 DIAGNOSIS — Z967 Presence of other bone and tendon implants: Secondary | ICD-10-CM | POA: Diagnosis not present

## 2015-08-25 DIAGNOSIS — I480 Paroxysmal atrial fibrillation: Secondary | ICD-10-CM | POA: Diagnosis not present

## 2015-08-25 DIAGNOSIS — I071 Rheumatic tricuspid insufficiency: Secondary | ICD-10-CM | POA: Diagnosis not present

## 2015-08-25 DIAGNOSIS — S42221G 2-part displaced fracture of surgical neck of right humerus, subsequent encounter for fracture with delayed healing: Secondary | ICD-10-CM | POA: Diagnosis not present

## 2015-08-25 DIAGNOSIS — S42291D Other displaced fracture of upper end of right humerus, subsequent encounter for fracture with routine healing: Secondary | ICD-10-CM | POA: Diagnosis not present

## 2015-08-25 DIAGNOSIS — J449 Chronic obstructive pulmonary disease, unspecified: Secondary | ICD-10-CM | POA: Diagnosis not present

## 2015-08-25 DIAGNOSIS — M6281 Muscle weakness (generalized): Secondary | ICD-10-CM | POA: Diagnosis not present

## 2015-08-25 DIAGNOSIS — I10 Essential (primary) hypertension: Secondary | ICD-10-CM | POA: Diagnosis not present

## 2015-08-28 ENCOUNTER — Telehealth: Payer: Self-pay | Admitting: Internal Medicine

## 2015-08-28 ENCOUNTER — Other Ambulatory Visit: Payer: Self-pay | Admitting: *Deleted

## 2015-08-28 MED ORDER — DABIGATRAN ETEXILATE MESYLATE 75 MG PO CAPS: 150.0000 mg | ORAL_CAPSULE | Freq: Two times a day (BID) | ORAL | Status: DC

## 2015-08-28 NOTE — Telephone Encounter (Signed)
Needs some Clarification on pradaxa. Please call   Thanks

## 2015-08-28 NOTE — Telephone Encounter (Signed)
Previous Rx for pradaxa sent in for wrong quantity per frequency of dosing for patient. Phoned in updated Rx

## 2015-08-30 DIAGNOSIS — J449 Chronic obstructive pulmonary disease, unspecified: Secondary | ICD-10-CM | POA: Diagnosis not present

## 2015-08-30 DIAGNOSIS — I10 Essential (primary) hypertension: Secondary | ICD-10-CM | POA: Diagnosis not present

## 2015-08-30 DIAGNOSIS — M6281 Muscle weakness (generalized): Secondary | ICD-10-CM | POA: Diagnosis not present

## 2015-08-30 DIAGNOSIS — I071 Rheumatic tricuspid insufficiency: Secondary | ICD-10-CM | POA: Diagnosis not present

## 2015-08-30 DIAGNOSIS — S42291D Other displaced fracture of upper end of right humerus, subsequent encounter for fracture with routine healing: Secondary | ICD-10-CM | POA: Diagnosis not present

## 2015-08-30 DIAGNOSIS — I480 Paroxysmal atrial fibrillation: Secondary | ICD-10-CM | POA: Diagnosis not present

## 2015-09-01 DIAGNOSIS — I480 Paroxysmal atrial fibrillation: Secondary | ICD-10-CM | POA: Diagnosis not present

## 2015-09-01 DIAGNOSIS — J449 Chronic obstructive pulmonary disease, unspecified: Secondary | ICD-10-CM | POA: Diagnosis not present

## 2015-09-01 DIAGNOSIS — S42291D Other displaced fracture of upper end of right humerus, subsequent encounter for fracture with routine healing: Secondary | ICD-10-CM | POA: Diagnosis not present

## 2015-09-01 DIAGNOSIS — M6281 Muscle weakness (generalized): Secondary | ICD-10-CM | POA: Diagnosis not present

## 2015-09-01 DIAGNOSIS — I071 Rheumatic tricuspid insufficiency: Secondary | ICD-10-CM | POA: Diagnosis not present

## 2015-09-01 DIAGNOSIS — I10 Essential (primary) hypertension: Secondary | ICD-10-CM | POA: Diagnosis not present

## 2015-09-06 DIAGNOSIS — M6281 Muscle weakness (generalized): Secondary | ICD-10-CM | POA: Diagnosis not present

## 2015-09-06 DIAGNOSIS — J449 Chronic obstructive pulmonary disease, unspecified: Secondary | ICD-10-CM | POA: Diagnosis not present

## 2015-09-06 DIAGNOSIS — I10 Essential (primary) hypertension: Secondary | ICD-10-CM | POA: Diagnosis not present

## 2015-09-06 DIAGNOSIS — I071 Rheumatic tricuspid insufficiency: Secondary | ICD-10-CM | POA: Diagnosis not present

## 2015-09-06 DIAGNOSIS — I480 Paroxysmal atrial fibrillation: Secondary | ICD-10-CM | POA: Diagnosis not present

## 2015-09-06 DIAGNOSIS — S42291D Other displaced fracture of upper end of right humerus, subsequent encounter for fracture with routine healing: Secondary | ICD-10-CM | POA: Diagnosis not present

## 2015-09-07 ENCOUNTER — Ambulatory Visit: Payer: Medicare Other | Admitting: Internal Medicine

## 2015-09-08 DIAGNOSIS — M6281 Muscle weakness (generalized): Secondary | ICD-10-CM | POA: Diagnosis not present

## 2015-09-08 DIAGNOSIS — S42291D Other displaced fracture of upper end of right humerus, subsequent encounter for fracture with routine healing: Secondary | ICD-10-CM | POA: Diagnosis not present

## 2015-09-08 DIAGNOSIS — I480 Paroxysmal atrial fibrillation: Secondary | ICD-10-CM | POA: Diagnosis not present

## 2015-09-08 DIAGNOSIS — I071 Rheumatic tricuspid insufficiency: Secondary | ICD-10-CM | POA: Diagnosis not present

## 2015-09-08 DIAGNOSIS — J449 Chronic obstructive pulmonary disease, unspecified: Secondary | ICD-10-CM | POA: Diagnosis not present

## 2015-09-08 DIAGNOSIS — I10 Essential (primary) hypertension: Secondary | ICD-10-CM | POA: Diagnosis not present

## 2015-09-09 IMAGING — CR DG TOE 2ND 2+V*R*
3 series · 3 of 3 positions shown · non-contrast
Comparison: None.

CLINICAL DATA: 74-year-old female with fall and pain in the right
second toe.

EXAM:
RIGTH SECOND TOE

[toe ap]
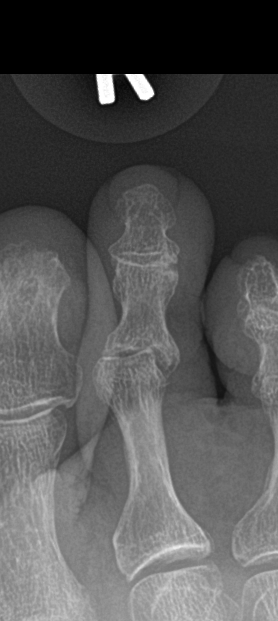

[toe obl]
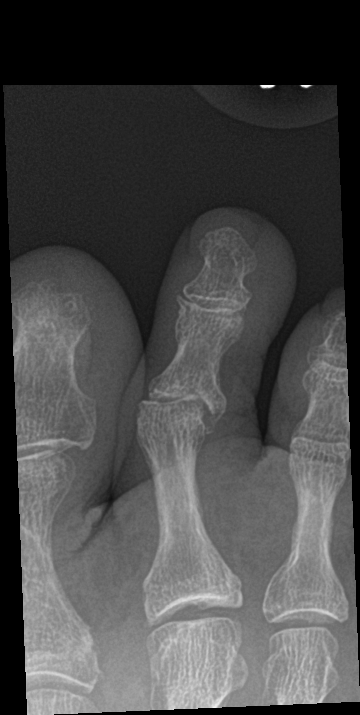

[toe lat]
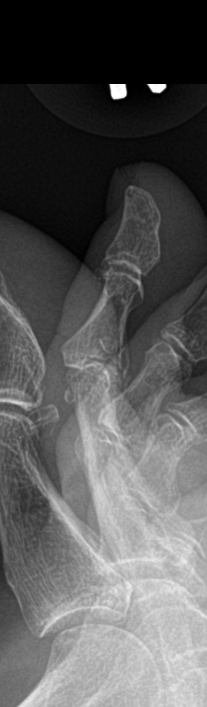

[3 of 3 positions shown; findings below may reference images not displayed]

FINDINGS: No acute fracture or dislocation. There is degenerative changes of
the proximal and distal interphalangeal joints with mild chronic
appearing lateral subluxation of the toe at the PIP. The soft
tissues are grossly unremarkable.
IMPRESSION: No acute fracture or dislocation.

## 2015-09-09 IMAGING — CT CT HEAD W/O CM
3 of 6 series · 13 of 47 positions shown, 15 images · non-contrast
Comparison: None

CLINICAL DATA: 74-year-old female status post trauma stop

EXAM:
CT HEAD WITHOUT CONTRAST
CT CERVICAL SPINE WITHOUT CONTRAST
TECHNIQUE: Multidetector CT imaging of the head and cervical spine was
performed following the standard protocol without intravenous
contrast. Multiplanar CT image reconstructions of the cervical spine
were also generated.

[Series 304: coronal · coronal · 0.36mm/px · 3 of 43 slices shown]
[im 15/43  brain]
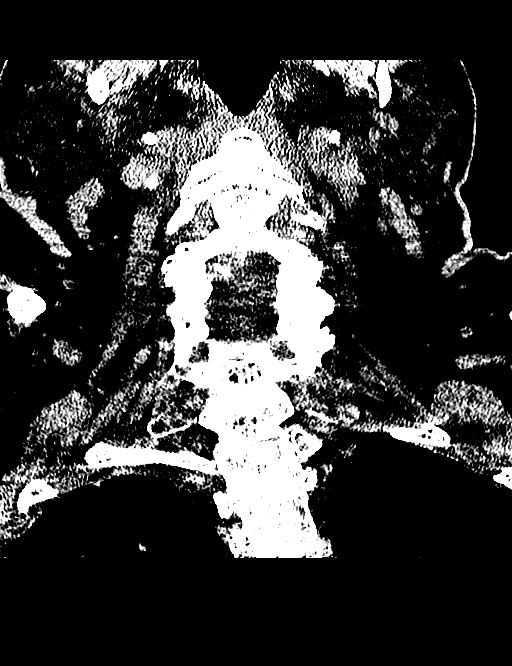
[im 19/43  brain]
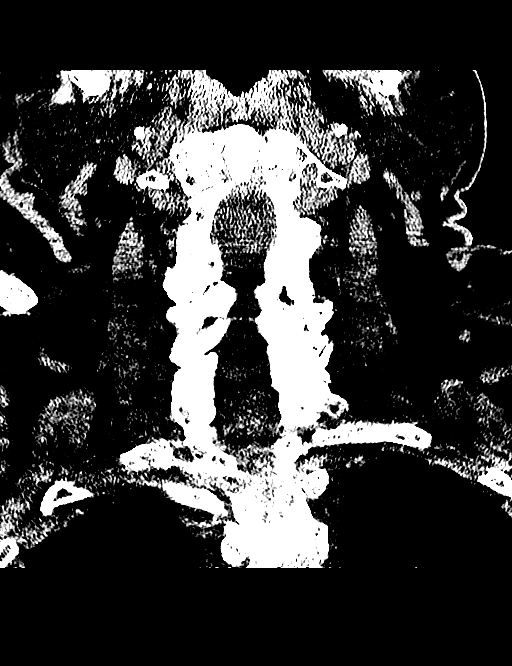
[im 24/43  brain]
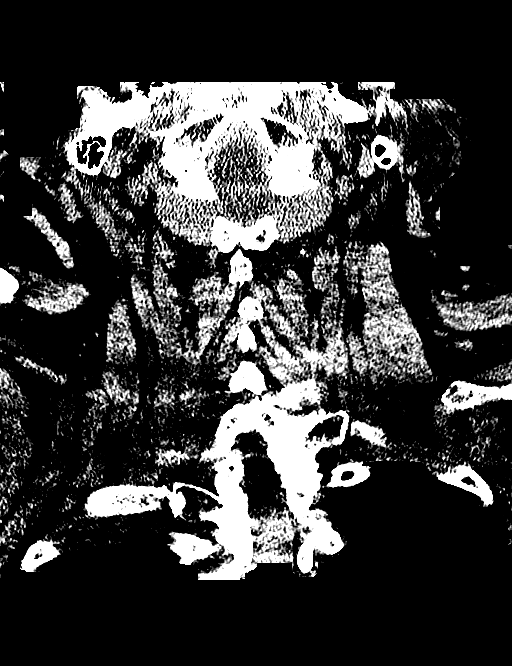

[Series 305: sagittal · sagittal · 0.36mm/px · 3 of 42 slices shown]
[im 14/42  brain]
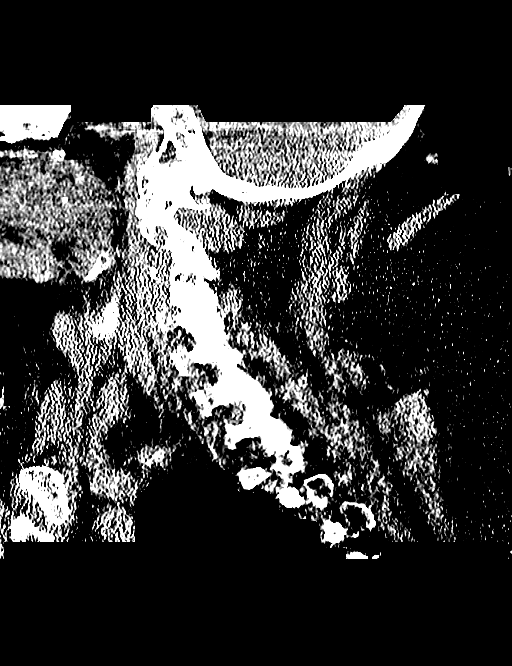
[im 21/42  brain]
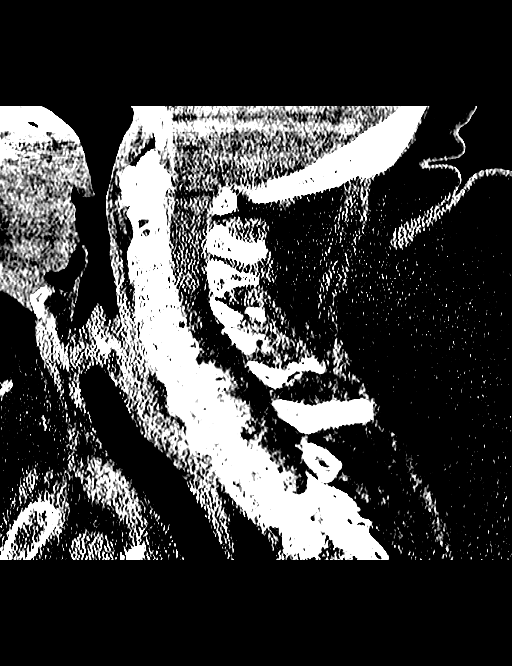
[im 28/42  brain]
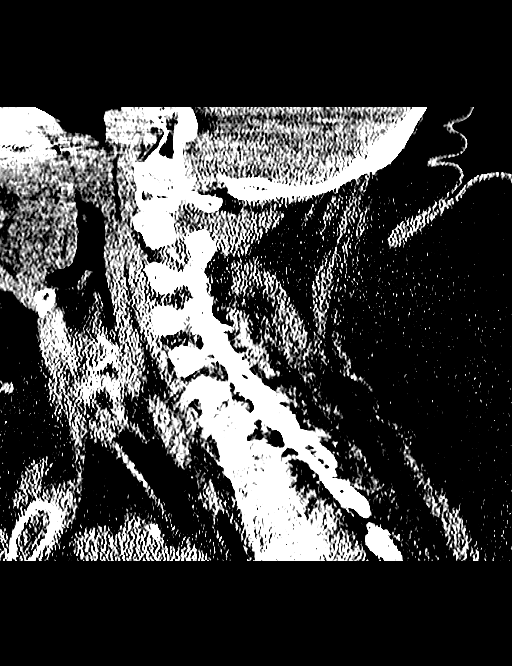

[Series 306: orthog · axial · 0.36mm/px · z∈[+755,+870]mm · 7 of 80 slices shown, 9 images]
[im 10/80  brain]
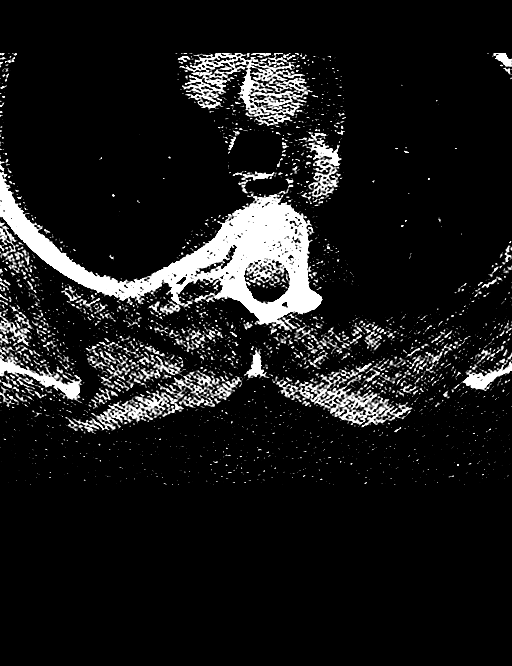
[im 10/80  bone]
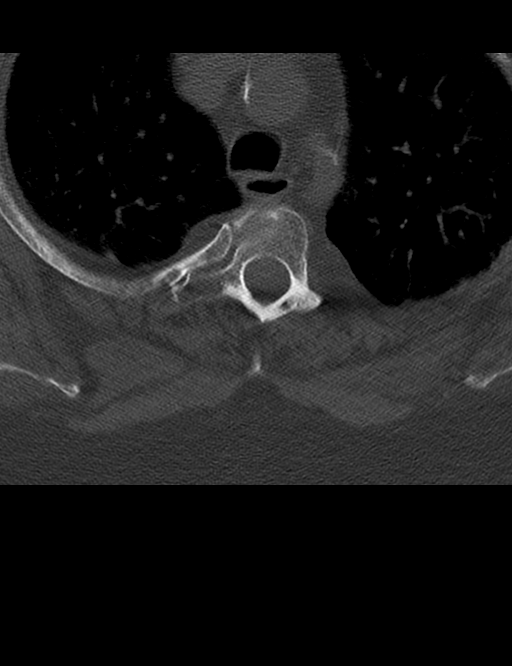
[im 20/80  brain]
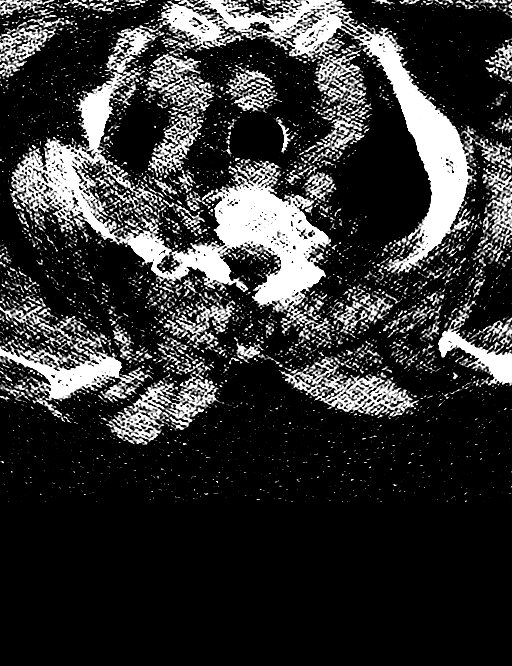
[im 30/80  brain]
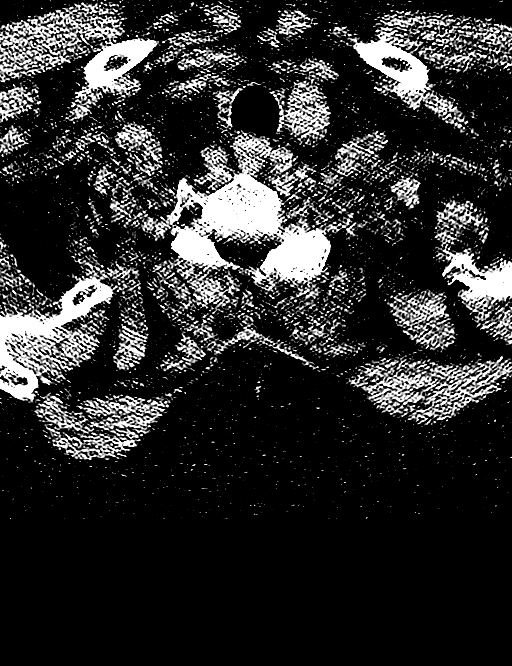
[im 40/80  brain]
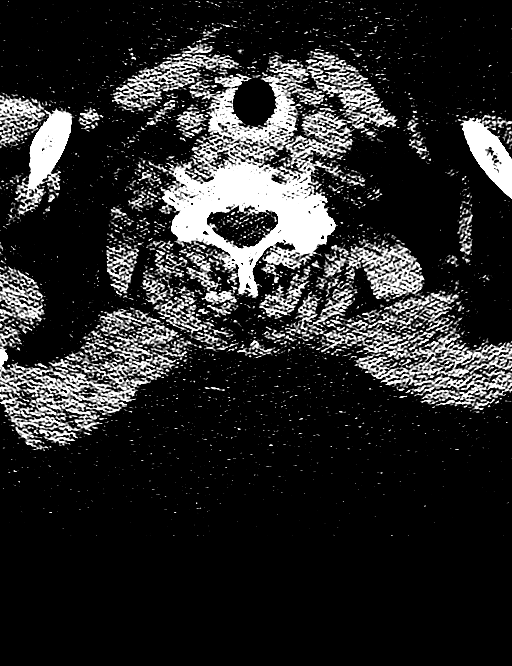
[im 50/80  brain]
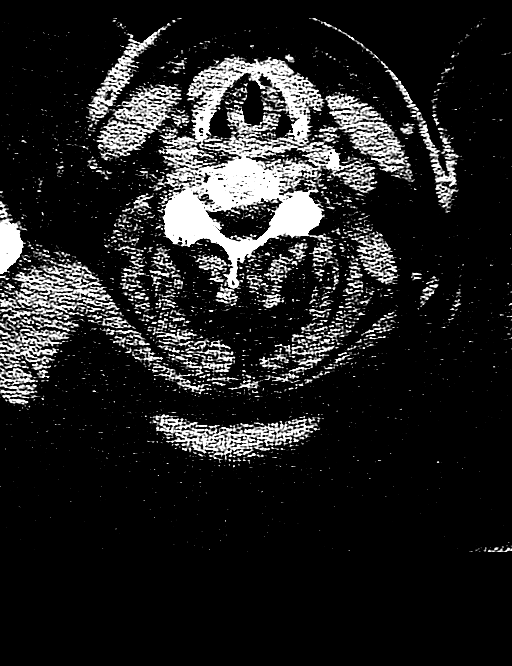
[im 50/80  bone]
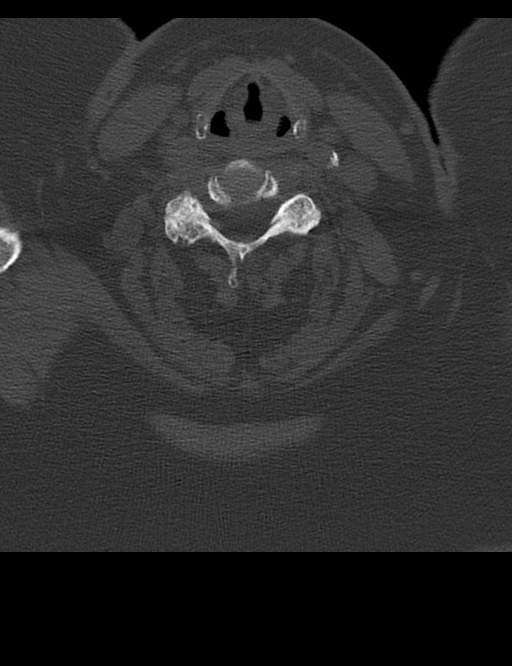
[im 60/80  brain]
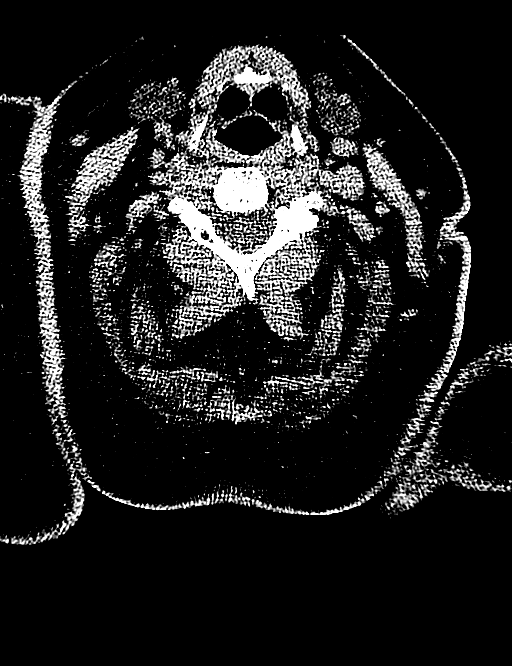
[im 70/80  brain]
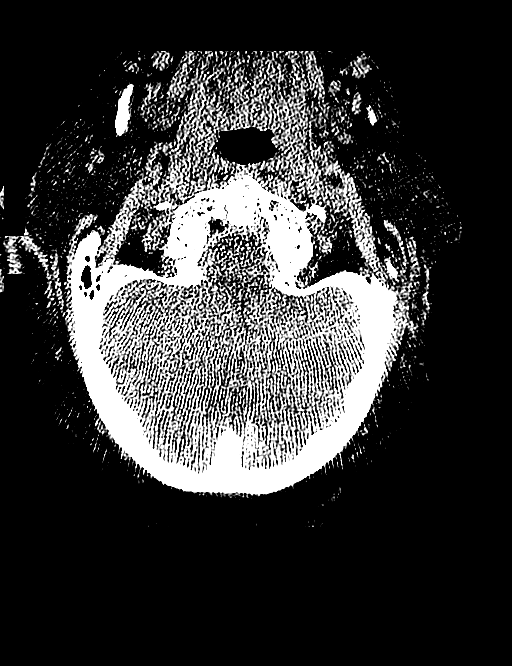

[13 of 47 positions shown; findings below may reference images not displayed]

FINDINGS: CT HEAD FINDINGS

Evaluation is limited due to streak artifact caused by metallic
dental work.

The ventricles and sulci are appropriate in size for patient's age.
Mild periventricular and deep white matter hypodensities represent
chronic microvascular ischemic changes. There is no intracranial
hemorrhage. No mass effect or midline shift identified.

The visualized paranasal sinuses and mastoid air cells are well
aerated. The calvarium is intact.

CT CERVICAL SPINE FINDINGS

No acute fracture or subluxation.There is osteopenia with multilevel
degenerative changes with facet hypertrophy.The odontoid and spinous
processes are intact.There is normal anatomic alignment of the C1-C2
lateral masses. The visualized soft tissues appear unremarkable.

There is mild emphysematous
IMPRESSION: No acute intracranial pathology

No acute cervical spine fracture.

## 2015-09-12 ENCOUNTER — Encounter: Payer: Self-pay | Admitting: Internal Medicine

## 2015-09-12 ENCOUNTER — Ambulatory Visit (INDEPENDENT_AMBULATORY_CARE_PROVIDER_SITE_OTHER): Payer: Medicare Other | Admitting: Internal Medicine

## 2015-09-12 VITALS — BP 122/82 | HR 95 | Ht 66.0 in | Wt 255.4 lb

## 2015-09-12 DIAGNOSIS — S42291D Other displaced fracture of upper end of right humerus, subsequent encounter for fracture with routine healing: Secondary | ICD-10-CM | POA: Diagnosis not present

## 2015-09-12 DIAGNOSIS — R498 Other voice and resonance disorders: Secondary | ICD-10-CM

## 2015-09-12 DIAGNOSIS — I480 Paroxysmal atrial fibrillation: Secondary | ICD-10-CM | POA: Diagnosis not present

## 2015-09-12 DIAGNOSIS — E785 Hyperlipidemia, unspecified: Secondary | ICD-10-CM

## 2015-09-12 DIAGNOSIS — I10 Essential (primary) hypertension: Secondary | ICD-10-CM | POA: Diagnosis not present

## 2015-09-12 DIAGNOSIS — R0609 Other forms of dyspnea: Secondary | ICD-10-CM

## 2015-09-12 DIAGNOSIS — I071 Rheumatic tricuspid insufficiency: Secondary | ICD-10-CM | POA: Diagnosis not present

## 2015-09-12 DIAGNOSIS — M6281 Muscle weakness (generalized): Secondary | ICD-10-CM | POA: Diagnosis not present

## 2015-09-12 DIAGNOSIS — J449 Chronic obstructive pulmonary disease, unspecified: Secondary | ICD-10-CM | POA: Diagnosis not present

## 2015-09-12 DIAGNOSIS — R0600 Dyspnea, unspecified: Secondary | ICD-10-CM

## 2015-09-12 DIAGNOSIS — Z6841 Body Mass Index (BMI) 40.0 and over, adult: Secondary | ICD-10-CM

## 2015-09-12 MED ORDER — DABIGATRAN ETEXILATE MESYLATE 150 MG PO CAPS
150.0000 mg | ORAL_CAPSULE | Freq: Two times a day (BID) | ORAL | Status: DC
Start: 2015-09-12 — End: 2016-03-12

## 2015-09-12 NOTE — Patient Instructions (Signed)
Your physician wants you to follow-up in: 6 months with Dr. Hilty. You will receive a reminder letter in the mail two months in advance. If you don't receive a letter, please call our office to schedule the follow-up appointment.    

## 2015-09-13 NOTE — Progress Notes (Signed)
OFFICE NOTE  Chief Complaint:  Routine follow-up  Primary Care Physician: Dwan Bolt, MD  HPI:  Amber Wu is a 74 year old female who has a history of COPD, morbid obesity, gout, hypertension and multiple other problems, including esophageal stricture and numerous dilatations. She was found to be in A-fib and you appropriately started her on Pradaxa as well as a beta blocker. 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 She says her handwriting is now more readable. She seems to be tolerating the Pradaxa without any adverse bleeding complications. She has been on that for now 1 month and therefore should be at low risk of having an intracardiac thrombus. Unfortunately, her heart remains in A-fib with a better controlled ventricular response at 99. She is only mildly aware of this A-fib. We discussed pros and cons of A-fib. However, given the fact that this is fairly new onset, I think she deserves an opportunity to try to get back to sinus rhythm. We therefore will pursue an elective DC cardioversion in the next week or so. I will go ahead and keep you informed of the progress of that. Finally, we did an echocardiogram. Her LV systolic function and diastolic function appear to be preserved; however, the left atrium is moderately dilated with a volume of 34 mL/meter squared and otherwise no significant valvular disease. After her last visit she underwent an elective cardioversion, which was unsuccessful at restoring sinus rhythm. Therefore we have elected to manage her with rate control and anticoagulation. Today her main complaint is shortness of breath and fatigue which is not much different than it has been in the past.   She seems to be tolerating her Pradaxa. She's had no bleeding complaints with this. Her atrial fibrillation is rate controlled. She  did report an episode over the holidays where she had significant lower extremity swelling. She was in [place] and contemplated going to the emergency room ultimately took some of her friend's Lasix. This did improve her swelling and it has since not come back. She denies eating too much salt, but when questioned about this she said that salt is good for her fact it was recommended because of her thyroid problems? Which did not make much sense to me.  Amber Wu returns today for followup. She feels easily she's been having progressive shortness of breath. She does not think this is attributed to her weight of because it is been stable. She also had noted that she is unable to do most activities. She gets progressively weak and fatigued doing simple activities such as grocery shopping. She noted the other day that she became profusely diuretic when walking through [employer] which was unusual for her. Despite this, she does not report any chest pain.  Some still back in the office today. Again she is complaining of shortness of breath with exertion. Her stress test was performed in the fall which was negative for ischemia. EF was not reported due to lack of gating. She's not had an echo in several years. She has a notable tremor and a significant vocal tremor. 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 I wonder if that's playing a role in her upper airway and causing her to be short of breath. Could also be some degree of diastolic or systolic dysfunction. Finally, she is reported a mass in her  left neck. She's felt swelling has been getting worse over the past several months. She is scheduled to see her primary care provider in about a month.  Amber Wu returns today for follow-up. In the interim she is seen Dr. Melvyn Novas with pulmonary who does not feel she has COPD. He is concerned about upper airway pathology as MI with vocal tremor and change in her symptoms. This could be worsening her  shortness of breath. She also reports profound weakness and and recently has been having hallucinations. I wonder if there is an underlying neurologic responsible for her symptoms. She did have an echo which shows normal systolic function. She may have a degree of diastolic dysfunction and recently blood pressure has been elevated. She reports some more leg swelling.  I saw Amber Wu back in the office today in follow-up. She's had a very eventful several months. She recently fell and fractured her right humerus. She is underwent rehabilitation for that seems to be doing much better. When I last saw her she was having significant complaints of shortness of breath and trouble with her voice. I referred her to neurology who felt that this was likely a vocal tremor. She said at the time of that appointment that it was not that bothersome however indicated was much more significant to me. She still has some significant shortness of breath and has fortunately managed to lose a few pounds which I think will be helpful. She feels a lot of her symptoms may be due to anxiety and has had some improvement with Valium. She wants to talk to her primary care provider about some long-term treatments for anxiety. She does take Celexa 20 mg daily which she's been on for some time.  PMHx:  Past Medical History  Diagnosis Date  . Allergy   . Hypothyroidism   . COPD (chronic obstructive pulmonary disease) ([place])   . GERD (gastroesophageal reflux disease)   . Barrett's esophagus   . Hypertension   . Depression   . Arthritis   . History of esophageal stricture   . Atrial fibrillation ([place])     2D ECHO, 08/12/2012 - EF >55%, LA moderately dilated, RA moderately dilated, moderate tricuspid valve regurgitation  . Gastroparesis   . Hiatal hernia   . Shortness of breath dyspnea     Past Surgical History  Procedure Laterality Date  . Facial cosmetic surgery  2000  . Abdominal hysterectomy  1970  . Cardioversion  09/14/2012      Procedure: CARDIOVERSION;  Surgeon: Pixie Casino, MD;  Location: [facility] ENDOSCOPY;  Service: Cardiovascular;  Laterality: N/A;  . Cataract extraction Bilateral 06/2014-08/2014  . Colonoscopy    . Knee surgery Right   . Orif humerus fracture Right 07/06/2015    Procedure: OPEN REDUCTION INTERNAL FIXATION (ORIF) RIGHT PROXIMAL HUMERUS FRACTURE;  Surgeon: Justice Britain, MD;  Location: [place];  Service: Orthopedics;  Laterality: Right;    FAMHx:  Family History  Problem Relation Age of Onset  . Colon cancer Neg Hx     SOCHx:   reports that she quit smoking about 21 years ago. Her smoking use included Cigarettes. She has a 7.5 pack-year smoking history. She has never used smokeless tobacco. She reports that she does not drink alcohol or use illicit drugs.  ALLERGIES:  Allergies  Allergen Reactions  . Antihistamines, Chlorpheniramine-Type Other (See Comments)    INTENSE SHAKING AND UTI  . Antihistamines, Diphenhydramine-Type Other (See Comments)    INTENSE SHAKING AND UTI  . Antihistamines,  Loratadine-Type Other (See Comments)    INTENSE SHAKING AND UTI    ROS: A comprehensive review of systems was negative except for: Constitutional: positive for fatigue and malaise Ears, nose, mouth, throat, and face: positive for voice change and Left neck mass Respiratory: positive for dyspnea on exertion and emphysema Cardiovascular: positive for dyspnea Integument/breast: positive for diaphoresis  HOME MEDS: Current Outpatient Prescriptions  Medication Sig Dispense Refill  . allopurinol (ZYLOPRIM) 100 MG tablet Take 100 mg by mouth 2 (two) times daily.     Marland Kitchen amLODipine (NORVASC) 10 MG tablet Take 10 mg by mouth daily.  5  . aspirin 81 MG tablet Take 81 mg by mouth daily.      . Cholecalciferol (VITAMIN D-3) 1000 UNITS CAPS Take 1,000 Units by mouth daily.    . citalopram (CELEXA) 20 MG tablet Take 20 mg by mouth daily.    . dabigatran (PRADAXA) 150 MG CAPS capsule Take 1 capsule (150 mg  total) by mouth 2 (two) times daily. 180 capsule 3  . dexlansoprazole (DEXILANT) 60 MG capsule Take 60 mg by mouth daily.      . diazepam (VALIUM) 5 MG tablet Take 0.5-1 tablets (2.5-5 mg total) by mouth every 6 (six) hours as needed for muscle spasms or sedation. (Patient taking differently: Take 5 mg by mouth every 6 (six) hours as needed for muscle spasms or sedation. ) 40 tablet 1  . famotidine (PEPCID) 20 MG tablet Take 20 mg by mouth at bedtime.    . furosemide (LASIX) 20 MG tablet Take 1 tablet (20 mg total) by mouth daily as needed. 90 tablet 3  . levothyroxine (SYNTHROID, LEVOTHROID) 150 MCG tablet Take 150 mcg by mouth daily before breakfast.    . metoprolol succinate (TOPROL-XL) 25 MG 24 hr tablet Take 25 mg by mouth 2 (two) times daily.     . Multiple Vitamin (MULTIVITAMIN) tablet Take 1 tablet by mouth daily.    . Psyllium (METAMUCIL PO) Take 1 Package by mouth daily.    . rosuvastatin (CRESTOR) 10 MG tablet Take 10 mg by mouth daily.       No current facility-administered medications for this visit.   Facility-Administered Medications Ordered in Other Visits  Medication Dose Route Frequency Provider Last Rate Last Dose  . sodium chloride 0.9 % injection 3 mL  3 mL Intravenous PRN Pixie Casino, MD        LABS/IMAGING: No results found for this or any previous visit (from the past 48 hour(s)). No results found.  VITALS: BP 122/82 mmHg  Pulse 95  Ht 5\' 6"  (1.676 m)  Wt 255 lb 6.4 oz (115.849 kg)  BMI 41.24 kg/m2  EXAM: General appearance: alert and no distress, morbidly obese Neck: Left neck mass, firm no carotid bruit, no JVD Lungs: clear to auscultation bilaterally Heart: irregularly irregular rhythm Abdomen: Morbidly obese Extremities: extremities normal, atraumatic, trace bilateral edema Pulses: 2+ and symmetric Skin: Skin color, texture, turgor normal. No rashes or lesions Neurologic: Grossly normal Psych: Mildly anxious  EKG: Atrial fibrillation at  95  ASSESSMENT: 1. Permanent atrial fibrillation on Pradaxa 150 mg twice daily  2. COPD with shortness of breath 3. Hypertension 4. Dyslipidemia 5. Morbid obesity 6. History of esophageal stricture and numerous dilatations 7. Moderately dilated left atrium 8. History of esophageal stricture 9. Vocal tremor 10. Weakness/visual hallucinations  PLAN: 1.   Ms. Kreger seems to be fairly stable. She's been evaluated by neurology and felt that she has a vocal tremor  but no other worrisome neurologic findings. Recently she fractured her right arm and his rehabilitative mostly from that. Blood pressure is now well controlled after adjustments in her medications. She's managed to lose a few pounds but really needs to work on additional weight loss and exercise. Plan to see her back in 6 months.  Pixie Casino, MD, [facility] Attending Cardiologist [place] 09/13/2015, 8:01 AM

## 2015-09-14 DIAGNOSIS — I10 Essential (primary) hypertension: Secondary | ICD-10-CM | POA: Diagnosis not present

## 2015-09-15 DIAGNOSIS — I10 Essential (primary) hypertension: Secondary | ICD-10-CM | POA: Diagnosis not present

## 2015-09-15 DIAGNOSIS — I071 Rheumatic tricuspid insufficiency: Secondary | ICD-10-CM | POA: Diagnosis not present

## 2015-09-15 DIAGNOSIS — S42291D Other displaced fracture of upper end of right humerus, subsequent encounter for fracture with routine healing: Secondary | ICD-10-CM | POA: Diagnosis not present

## 2015-09-15 DIAGNOSIS — M6281 Muscle weakness (generalized): Secondary | ICD-10-CM | POA: Diagnosis not present

## 2015-09-15 DIAGNOSIS — J449 Chronic obstructive pulmonary disease, unspecified: Secondary | ICD-10-CM | POA: Diagnosis not present

## 2015-09-15 DIAGNOSIS — I480 Paroxysmal atrial fibrillation: Secondary | ICD-10-CM | POA: Diagnosis not present

## 2015-09-18 DIAGNOSIS — J449 Chronic obstructive pulmonary disease, unspecified: Secondary | ICD-10-CM | POA: Diagnosis not present

## 2015-09-18 DIAGNOSIS — M6281 Muscle weakness (generalized): Secondary | ICD-10-CM | POA: Diagnosis not present

## 2015-09-18 DIAGNOSIS — I071 Rheumatic tricuspid insufficiency: Secondary | ICD-10-CM | POA: Diagnosis not present

## 2015-09-18 DIAGNOSIS — I480 Paroxysmal atrial fibrillation: Secondary | ICD-10-CM | POA: Diagnosis not present

## 2015-09-18 DIAGNOSIS — I10 Essential (primary) hypertension: Secondary | ICD-10-CM | POA: Diagnosis not present

## 2015-09-18 DIAGNOSIS — S42291D Other displaced fracture of upper end of right humerus, subsequent encounter for fracture with routine healing: Secondary | ICD-10-CM | POA: Diagnosis not present

## 2015-09-20 DIAGNOSIS — J449 Chronic obstructive pulmonary disease, unspecified: Secondary | ICD-10-CM | POA: Diagnosis not present

## 2015-09-20 DIAGNOSIS — M6281 Muscle weakness (generalized): Secondary | ICD-10-CM | POA: Diagnosis not present

## 2015-09-20 DIAGNOSIS — I071 Rheumatic tricuspid insufficiency: Secondary | ICD-10-CM | POA: Diagnosis not present

## 2015-09-20 DIAGNOSIS — S42291D Other displaced fracture of upper end of right humerus, subsequent encounter for fracture with routine healing: Secondary | ICD-10-CM | POA: Diagnosis not present

## 2015-09-20 DIAGNOSIS — I10 Essential (primary) hypertension: Secondary | ICD-10-CM | POA: Diagnosis not present

## 2015-09-20 DIAGNOSIS — I480 Paroxysmal atrial fibrillation: Secondary | ICD-10-CM | POA: Diagnosis not present

## 2015-09-21 DIAGNOSIS — I4891 Unspecified atrial fibrillation: Secondary | ICD-10-CM | POA: Diagnosis not present

## 2015-09-21 DIAGNOSIS — I10 Essential (primary) hypertension: Secondary | ICD-10-CM | POA: Diagnosis not present

## 2015-09-21 DIAGNOSIS — Z09 Encounter for follow-up examination after completed treatment for conditions other than malignant neoplasm: Secondary | ICD-10-CM | POA: Diagnosis not present

## 2015-09-22 DIAGNOSIS — Z8781 Personal history of (healed) traumatic fracture: Secondary | ICD-10-CM | POA: Diagnosis not present

## 2015-09-22 DIAGNOSIS — S42221G 2-part displaced fracture of surgical neck of right humerus, subsequent encounter for fracture with delayed healing: Secondary | ICD-10-CM | POA: Diagnosis not present

## 2015-09-22 DIAGNOSIS — Z967 Presence of other bone and tendon implants: Secondary | ICD-10-CM | POA: Diagnosis not present

## 2015-09-25 DIAGNOSIS — S42291D Other displaced fracture of upper end of right humerus, subsequent encounter for fracture with routine healing: Secondary | ICD-10-CM | POA: Diagnosis not present

## 2015-09-25 DIAGNOSIS — I10 Essential (primary) hypertension: Secondary | ICD-10-CM | POA: Diagnosis not present

## 2015-09-25 DIAGNOSIS — I071 Rheumatic tricuspid insufficiency: Secondary | ICD-10-CM | POA: Diagnosis not present

## 2015-09-25 DIAGNOSIS — M6281 Muscle weakness (generalized): Secondary | ICD-10-CM | POA: Diagnosis not present

## 2015-09-25 DIAGNOSIS — I480 Paroxysmal atrial fibrillation: Secondary | ICD-10-CM | POA: Diagnosis not present

## 2015-09-25 DIAGNOSIS — J449 Chronic obstructive pulmonary disease, unspecified: Secondary | ICD-10-CM | POA: Diagnosis not present

## 2015-09-26 DIAGNOSIS — J449 Chronic obstructive pulmonary disease, unspecified: Secondary | ICD-10-CM | POA: Diagnosis not present

## 2015-09-26 DIAGNOSIS — I10 Essential (primary) hypertension: Secondary | ICD-10-CM | POA: Diagnosis not present

## 2015-09-26 DIAGNOSIS — M6281 Muscle weakness (generalized): Secondary | ICD-10-CM | POA: Diagnosis not present

## 2015-09-26 DIAGNOSIS — I480 Paroxysmal atrial fibrillation: Secondary | ICD-10-CM | POA: Diagnosis not present

## 2015-09-26 DIAGNOSIS — I071 Rheumatic tricuspid insufficiency: Secondary | ICD-10-CM | POA: Diagnosis not present

## 2015-09-26 DIAGNOSIS — S42291D Other displaced fracture of upper end of right humerus, subsequent encounter for fracture with routine healing: Secondary | ICD-10-CM | POA: Diagnosis not present

## 2015-10-20 DIAGNOSIS — Z967 Presence of other bone and tendon implants: Secondary | ICD-10-CM | POA: Diagnosis not present

## 2015-10-20 DIAGNOSIS — S42221G 2-part displaced fracture of surgical neck of right humerus, subsequent encounter for fracture with delayed healing: Secondary | ICD-10-CM | POA: Diagnosis not present

## 2015-10-20 DIAGNOSIS — Z8781 Personal history of (healed) traumatic fracture: Secondary | ICD-10-CM | POA: Diagnosis not present

## 2015-11-08 DIAGNOSIS — I878 Other specified disorders of veins: Secondary | ICD-10-CM | POA: Diagnosis not present

## 2015-11-08 DIAGNOSIS — H43813 Vitreous degeneration, bilateral: Secondary | ICD-10-CM | POA: Diagnosis not present

## 2015-11-08 DIAGNOSIS — H35031 Hypertensive retinopathy, right eye: Secondary | ICD-10-CM | POA: Diagnosis not present

## 2015-11-08 DIAGNOSIS — Z961 Presence of intraocular lens: Secondary | ICD-10-CM | POA: Diagnosis not present

## 2015-11-08 DIAGNOSIS — H35032 Hypertensive retinopathy, left eye: Secondary | ICD-10-CM | POA: Diagnosis not present

## 2015-11-08 DIAGNOSIS — H3509 Other intraretinal microvascular abnormalities: Secondary | ICD-10-CM | POA: Diagnosis not present

## 2016-03-12 ENCOUNTER — Ambulatory Visit (INDEPENDENT_AMBULATORY_CARE_PROVIDER_SITE_OTHER): Payer: Medicare Other | Admitting: Internal Medicine

## 2016-03-12 ENCOUNTER — Encounter: Payer: Self-pay | Admitting: Internal Medicine

## 2016-03-12 VITALS — BP 124/60 | HR 82 | Ht 66.0 in | Wt 249.0 lb

## 2016-03-12 DIAGNOSIS — E785 Hyperlipidemia, unspecified: Secondary | ICD-10-CM | POA: Diagnosis not present

## 2016-03-12 DIAGNOSIS — I481 Persistent atrial fibrillation: Secondary | ICD-10-CM | POA: Diagnosis not present

## 2016-03-12 DIAGNOSIS — I480 Paroxysmal atrial fibrillation: Secondary | ICD-10-CM | POA: Diagnosis not present

## 2016-03-12 DIAGNOSIS — I4819 Other persistent atrial fibrillation: Secondary | ICD-10-CM

## 2016-03-12 DIAGNOSIS — R498 Other voice and resonance disorders: Secondary | ICD-10-CM

## 2016-03-12 MED ORDER — DABIGATRAN ETEXILATE MESYLATE 150 MG PO CAPS: 150.0000 mg | ORAL_CAPSULE | Freq: Two times a day (BID) | ORAL | Status: DC

## 2016-03-12 MED ORDER — CITALOPRAM HYDROBROMIDE 20 MG PO TABS: 20.0000 mg | ORAL_TABLET | Freq: Every day | ORAL | Status: AC

## 2016-03-12 NOTE — Patient Instructions (Signed)
Your pradaxa has been refilled.  Dr. Debara Pickett has provided a refill of celexa for a 1 month supply - the future refills will need to come from primary care.   Your physician wants you to follow-up in: 6 months with Dr. Debara Pickett. You will receive a reminder letter in the mail two months in advance. If you don't receive a letter, please call our office to schedule the follow-up appointment.

## 2016-03-14 NOTE — Progress Notes (Signed)
OFFICE NOTE  Chief Complaint:  Routine follow-up  Primary Care Physician: Dwan Bolt, MD  HPI:  Amber Wu is a 75 year old female who has a history of COPD, morbid obesity, gout, hypertension and multiple other problems, including esophageal stricture and numerous dilatations. She was found to be in A-fib and you appropriately started her on Pradaxa as well as a beta blocker. 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 She says her handwriting is now more readable. She seems to be tolerating the Pradaxa without any adverse bleeding complications. She has been on that for now 1 month and therefore should be at low risk of having an intracardiac thrombus. Unfortunately, her heart remains in A-fib with a better controlled ventricular response at 99. She is only mildly aware of this A-fib. We discussed pros and cons of A-fib. However, given the fact that this is fairly new onset, I think she deserves an opportunity to try to get back to sinus rhythm. We therefore will pursue an elective DC cardioversion in the next week or so. I will go ahead and keep you informed of the progress of that. Finally, we did an echocardiogram. Her LV systolic function and diastolic function appear to be preserved; however, the left atrium is moderately dilated with a volume of 34 mL/meter squared and otherwise no significant valvular disease. After her last visit she underwent an elective cardioversion, which was unsuccessful at restoring sinus rhythm. Therefore we have elected to manage her with rate control and anticoagulation. Today her main complaint is shortness of breath and fatigue which is not much different than it has been in the past.   She seems to be tolerating her Pradaxa. She's had no bleeding complaints with this. Her atrial fibrillation is rate controlled. She  did report an episode over the holidays where she had significant lower extremity swelling. She was in [place] and contemplated going to the emergency room ultimately took some of her friend's Lasix. This did improve her swelling and it has since not come back. She denies eating too much salt, but when questioned about this she said that salt is good for her fact it was recommended because of her thyroid problems? Which did not make much sense to me.  Amber Wu returns today for followup. She feels easily she's been having progressive shortness of breath. She does not think this is attributed to her weight of because it is been stable. She also had noted that she is unable to do most activities. She gets progressively weak and fatigued doing simple activities such as grocery shopping. She noted the other day that she became profusely diuretic when walking through [employer] which was unusual for her. Despite this, she does not report any chest pain.  Some still back in the office today. Again she is complaining of shortness of breath with exertion. Her stress test was performed in the fall which was negative for ischemia. EF was not reported due to lack of gating. She's not had an echo in several years. She has a notable tremor and a significant vocal tremor. 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 I wonder if that's playing a role in her upper airway and causing her to be short of breath. Could also be some degree of diastolic or systolic dysfunction. Finally, she is reported a mass in her  left neck. She's felt swelling has been getting worse over the past several months. She is scheduled to see her primary care provider in about a month.  Amber Wu returns today for follow-up. In the interim she is seen Dr. Melvyn Novas with pulmonary who does not feel she has COPD. He is concerned about upper airway pathology as MI with vocal tremor and change in her symptoms. This could be worsening her  shortness of breath. She also reports profound weakness and and recently has been having hallucinations. I wonder if there is an underlying neurologic responsible for her symptoms. She did have an echo which shows normal systolic function. She may have a degree of diastolic dysfunction and recently blood pressure has been elevated. She reports some more leg swelling.  I saw Amber Wu back in the office today in follow-up. She's had a very eventful several months. She recently fell and fractured her right humerus. She is underwent rehabilitation for that seems to be doing much better. When I last saw her she was having significant complaints of shortness of breath and trouble with her voice. I referred her to neurology who felt that this was likely a vocal tremor. She said at the time of that appointment that it was not that bothersome however indicated was much more significant to me. She still has some significant shortness of breath and has fortunately managed to lose a few pounds which I think will be helpful. She feels a lot of her symptoms may be due to anxiety and has had some improvement with Valium. She wants to talk to her primary care provider about some long-term treatments for anxiety. She does take Celexa 20 mg daily which she's been on for some time.  Amber Wu returns today for follow-up. Overall she is doing very well. She's had some improvement in her tremor on beta blocker. She's interested in a refill on her Celexa today. She says her primary care provider for some reason will not provide this for her. She says that she's done better on this medicine with regards to anxiety. She appears to have good cholesterol control on Crestor. Unfortunately weight is been stable. She will need to get that lower. Her blood pressure is at goal. No bleeding problems on Pradaxa.  PMHx:  Past Medical History  Diagnosis Date  . Allergy   . Hypothyroidism   . COPD (chronic obstructive pulmonary disease) ([place])     . GERD (gastroesophageal reflux disease)   . Barrett's esophagus   . Hypertension   . Depression   . Arthritis   . History of esophageal stricture   . Atrial fibrillation ([place])     2D ECHO, 08/12/2012 - EF >55%, LA moderately dilated, RA moderately dilated, moderate tricuspid valve regurgitation  . Gastroparesis   . Hiatal hernia   . Shortness of breath dyspnea     Past Surgical History  Procedure Laterality Date  . Facial cosmetic surgery  2000  . Abdominal hysterectomy  1970  . Cardioversion  09/14/2012    Procedure: CARDIOVERSION;  Surgeon: Pixie Casino, MD;  Location: [facility] ENDOSCOPY;  Service: Cardiovascular;  Laterality: N/A;  . Cataract extraction Bilateral 06/2014-08/2014  . Colonoscopy    . Knee surgery Right   . Orif humerus fracture Right 07/06/2015    Procedure: OPEN REDUCTION INTERNAL FIXATION (ORIF) RIGHT PROXIMAL HUMERUS FRACTURE;  Surgeon: Justice Britain, MD;  Location: [place];  Service: Orthopedics;  Laterality: Right;    FAMHx:  Family History  Problem Relation Age of  Onset  . Colon cancer Neg Hx     SOCHx:   reports that she quit smoking about 22 years ago. Her smoking use included Cigarettes. She has a 7.5 pack-year smoking history. She has never used smokeless tobacco. She reports that she does not drink alcohol or use illicit drugs.  ALLERGIES:  Allergies  Allergen Reactions  . Antihistamines, Chlorpheniramine-Type Other (See Comments)    INTENSE SHAKING AND UTI  . Antihistamines, Diphenhydramine-Type Other (See Comments)    INTENSE SHAKING AND UTI  . Antihistamines, Loratadine-Type Other (See Comments)    INTENSE SHAKING AND UTI    ROS: Pertinent items noted in HPI and remainder of comprehensive ROS otherwise negative.  HOME MEDS: Current Outpatient Prescriptions  Medication Sig Dispense Refill  . allopurinol (ZYLOPRIM) 100 MG tablet Take 100 mg by mouth 2 (two) times daily.     Marland Kitchen amLODipine (NORVASC) 10 MG tablet Take 10 mg by mouth daily.  5  .  aspirin 81 MG tablet Take 81 mg by mouth daily.      . Cholecalciferol (VITAMIN D-3) 1000 UNITS CAPS Take 1,000 Units by mouth daily.    . citalopram (CELEXA) 20 MG tablet Take 1 tablet (20 mg total) by mouth daily. 30 tablet 0  . dabigatran (PRADAXA) 150 MG CAPS capsule Take 1 capsule (150 mg total) by mouth 2 (two) times daily. 180 capsule 3  . dexlansoprazole (DEXILANT) 60 MG capsule Take 60 mg by mouth daily.      . famotidine (PEPCID) 20 MG tablet Take 20 mg by mouth at bedtime.    . furosemide (LASIX) 40 MG tablet Take 1 tablet by mouth daily.     Marland Kitchen levothyroxine (SYNTHROID, LEVOTHROID) 150 MCG tablet Take 150 mcg by mouth daily before breakfast.    . Multiple Vitamin (MULTIVITAMIN) tablet Take 1 tablet by mouth daily.    . Psyllium (METAMUCIL PO) Take 1 Package by mouth daily.    . rosuvastatin (CRESTOR) 10 MG tablet Take 10 mg by mouth daily.      . TOPROL XL 50 MG 24 hr tablet Take 1 tablet by mouth 2 (two) times daily.     No current facility-administered medications for this visit.   Facility-Administered Medications Ordered in Other Visits  Medication Dose Route Frequency Provider Last Rate Last Dose  . sodium chloride 0.9 % injection 3 mL  3 mL Intravenous PRN Pixie Casino, MD        LABS/IMAGING: No results found for this or any previous visit (from the past 48 hour(s)). No results found.  VITALS: BP 124/60 mmHg  Pulse 82  Ht 5\' 6"  (1.676 m)  Wt 249 lb (112.946 kg)  BMI 40.21 kg/m2  EXAM: General appearance: alert and no distress, morbidly obese Neck: Left neck mass, firm no carotid bruit, no JVD Lungs: clear to auscultation bilaterally Heart: irregularly irregular rhythm Abdomen: Morbidly obese Extremities: extremities normal, atraumatic, trace bilateral edema Pulses: 2+ and symmetric Skin: Skin color, texture, turgor normal. No rashes or lesions Neurologic: Grossly normal Psych: Mildly anxious  EKG: Atrial fibrillation at 82  ASSESSMENT: 1. Permanent  atrial fibrillation on Pradaxa 150 mg twice daily  2. COPD with shortness of breath 3. Hypertension 4. Dyslipidemia 5. Morbid obesity 6. History of esophageal stricture and numerous dilatations 7. Moderately dilated left atrium 8. History of esophageal stricture 9. Vocal tremor 10. Weakness/visual hallucinations  PLAN: 1.   Ms. Wu seems to be doing very well. She denies any chest pain or worsening shortness  of breath. She's had no bleeding problems on Pradaxa. A. fib is stable and rate controlled. She needs to continue to work on weight loss. She would like a refill of her Celexa which I'm happy to do. I would not like her to run out of that. She will contact her primary care provider for additional refills. We'll continue her current medications and plan to see her back in 6 months or sooner as necessary.  Pixie Casino, MD, [facility] Attending Cardiologist [place] 03/14/2016, 3:09 PM

## 2016-04-03 DIAGNOSIS — E032 Hypothyroidism due to medicaments and other exogenous substances: Secondary | ICD-10-CM | POA: Diagnosis not present

## 2016-04-03 DIAGNOSIS — I10 Essential (primary) hypertension: Secondary | ICD-10-CM | POA: Diagnosis not present

## 2016-04-03 DIAGNOSIS — E789 Disorder of lipoprotein metabolism, unspecified: Secondary | ICD-10-CM | POA: Diagnosis not present

## 2016-04-03 DIAGNOSIS — E875 Hyperkalemia: Secondary | ICD-10-CM | POA: Diagnosis not present

## 2016-04-09 DIAGNOSIS — I4891 Unspecified atrial fibrillation: Secondary | ICD-10-CM | POA: Diagnosis not present

## 2016-04-09 DIAGNOSIS — E032 Hypothyroidism due to medicaments and other exogenous substances: Secondary | ICD-10-CM | POA: Diagnosis not present

## 2016-04-09 DIAGNOSIS — I10 Essential (primary) hypertension: Secondary | ICD-10-CM | POA: Diagnosis not present

## 2016-04-09 DIAGNOSIS — E789 Disorder of lipoprotein metabolism, unspecified: Secondary | ICD-10-CM | POA: Diagnosis not present

## 2016-08-06 DIAGNOSIS — E789 Disorder of lipoprotein metabolism, unspecified: Secondary | ICD-10-CM | POA: Diagnosis not present

## 2016-08-06 DIAGNOSIS — I10 Essential (primary) hypertension: Secondary | ICD-10-CM | POA: Diagnosis not present

## 2016-08-09 ENCOUNTER — Other Ambulatory Visit: Payer: Self-pay

## 2016-08-13 DIAGNOSIS — E789 Disorder of lipoprotein metabolism, unspecified: Secondary | ICD-10-CM | POA: Diagnosis not present

## 2016-08-13 DIAGNOSIS — I10 Essential (primary) hypertension: Secondary | ICD-10-CM | POA: Diagnosis not present

## 2016-08-13 DIAGNOSIS — I4891 Unspecified atrial fibrillation: Secondary | ICD-10-CM | POA: Diagnosis not present

## 2016-08-13 DIAGNOSIS — Z23 Encounter for immunization: Secondary | ICD-10-CM | POA: Diagnosis not present

## 2016-11-15 ENCOUNTER — Ambulatory Visit: Payer: Medicare Other | Admitting: Internal Medicine

## 2016-11-18 DIAGNOSIS — H3561 Retinal hemorrhage, right eye: Secondary | ICD-10-CM | POA: Diagnosis not present

## 2016-11-18 DIAGNOSIS — H35033 Hypertensive retinopathy, bilateral: Secondary | ICD-10-CM | POA: Diagnosis not present

## 2016-11-18 DIAGNOSIS — I708 Atherosclerosis of other arteries: Secondary | ICD-10-CM | POA: Diagnosis not present

## 2016-11-18 DIAGNOSIS — H26491 Other secondary cataract, right eye: Secondary | ICD-10-CM | POA: Diagnosis not present

## 2016-11-22 ENCOUNTER — Encounter: Payer: Self-pay | Admitting: Internal Medicine

## 2016-11-22 ENCOUNTER — Ambulatory Visit (INDEPENDENT_AMBULATORY_CARE_PROVIDER_SITE_OTHER): Payer: Medicare Other | Admitting: Internal Medicine

## 2016-11-22 VITALS — BP 130/70 | HR 80 | Ht 65.0 in | Wt 251.8 lb

## 2016-11-22 DIAGNOSIS — R498 Other voice and resonance disorders: Secondary | ICD-10-CM | POA: Diagnosis not present

## 2016-11-22 DIAGNOSIS — Z6841 Body Mass Index (BMI) 40.0 and over, adult: Secondary | ICD-10-CM

## 2016-11-22 DIAGNOSIS — R0600 Dyspnea, unspecified: Secondary | ICD-10-CM

## 2016-11-22 DIAGNOSIS — R0609 Other forms of dyspnea: Secondary | ICD-10-CM | POA: Diagnosis not present

## 2016-11-22 DIAGNOSIS — I4819 Other persistent atrial fibrillation: Secondary | ICD-10-CM

## 2016-11-22 DIAGNOSIS — I481 Persistent atrial fibrillation: Secondary | ICD-10-CM

## 2016-11-22 NOTE — Patient Instructions (Signed)
Your physician wants you to follow-up in: ONE YEAR with Dr. Hilty. You will receive a reminder letter in the mail two months in advance. If you don't receive a letter, please call our office to schedule the follow-up appointment.  

## 2016-11-22 NOTE — Progress Notes (Signed)
OFFICE NOTE  Chief Complaint:  Routine follow-up  Primary Care Physician: Dwan Bolt, MD  HPI:  Amber Wu is a 75 year old female who has a history of COPD, morbid obesity, gout, hypertension and multiple other problems, including esophageal stricture and numerous dilatations. She was found to be in A-fib and you appropriately started her on Pradaxa as well as a beta blocker. 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 She says her handwriting is now more readable. She seems to be tolerating the Pradaxa without any adverse bleeding complications. She has been on that for now 1 month and therefore should be at low risk of having an intracardiac thrombus. Unfortunately, her heart remains in A-fib with a better controlled ventricular response at 99. She is only mildly aware of this A-fib. We discussed pros and cons of A-fib. However, given the fact that this is fairly new onset, I think she deserves an opportunity to try to get back to sinus rhythm. We therefore will pursue an elective DC cardioversion in the next week or so. I will go ahead and keep you informed of the progress of that. Finally, we did an echocardiogram. Her LV systolic function and diastolic function appear to be preserved; however, the left atrium is moderately dilated with a volume of 34 mL/meter squared and otherwise no significant valvular disease. After her last visit she underwent an elective cardioversion, which was unsuccessful at restoring sinus rhythm. Therefore we have elected to manage her with rate control and anticoagulation. Today her main complaint is shortness of breath and fatigue which is not much different than it has been in the past.   She seems to be tolerating her Pradaxa. She's had no bleeding complaints with this. Her atrial fibrillation is rate controlled. She  did report an episode over the holidays where she had significant lower extremity swelling. She was in [place] and contemplated going to the emergency room ultimately took some of her friend's Lasix. This did improve her swelling and it has since not come back. She denies eating too much salt, but when questioned about this she said that salt is good for her fact it was recommended because of her thyroid problems? Which did not make much sense to me.  Amber Wu returns today for followup. She feels easily she's been having progressive shortness of breath. She does not think this is attributed to her weight of because it is been stable. She also had noted that she is unable to do most activities. She gets progressively weak and fatigued doing simple activities such as grocery shopping. She noted the other day that she became profusely diuretic when walking through [employer] which was unusual for her. Despite this, she does not report any chest pain.  Some still back in the office today. Again she is complaining of shortness of breath with exertion. Her stress test was performed in the fall which was negative for ischemia. EF was not reported due to lack of gating. She's not had an echo in several years. She has a notable tremor and a significant vocal tremor. 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 I wonder if that's playing a role in her upper airway and causing her to be short of breath. Could also be some degree of diastolic or systolic dysfunction. Finally, she is reported a mass in her  left neck. She's felt swelling has been getting worse over the past several months. She is scheduled to see her primary care provider in about a month.  Amber Wu returns today for follow-up. In the interim she is seen Dr. Melvyn Novas with pulmonary who does not feel she has COPD. He is concerned about upper airway pathology as MI with vocal tremor and change in her symptoms. This could be worsening her  shortness of breath. She also reports profound weakness and and recently has been having hallucinations. I wonder if there is an underlying neurologic responsible for her symptoms. She did have an echo which shows normal systolic function. She may have a degree of diastolic dysfunction and recently blood pressure has been elevated. She reports some more leg swelling.  I saw Amber Wu back in the office today in follow-up. She's had a very eventful several months. She recently fell and fractured her right humerus. She is underwent rehabilitation for that seems to be doing much better. When I last saw her she was having significant complaints of shortness of breath and trouble with her voice. I referred her to neurology who felt that this was likely a vocal tremor. She said at the time of that appointment that it was not that bothersome however indicated was much more significant to me. She still has some significant shortness of breath and has fortunately managed to lose a few pounds which I think will be helpful. She feels a lot of her symptoms may be due to anxiety and has had some improvement with Valium. She wants to talk to her primary care provider about some long-term treatments for anxiety. She does take Celexa 20 mg daily which she's been on for some time.  Amber Wu returns today for follow-up. Overall she is doing very well. She's had some improvement in her tremor on beta blocker. She's interested in a refill on her Celexa today. She says her primary care provider for some reason will not provide this for her. She says that she's done better on this medicine with regards to anxiety. She appears to have good cholesterol control on Crestor. Unfortunately weight is been stable. She will need to get that lower. Her blood pressure is at goal. No bleeding problems on Pradaxa.  11/22/2016  Amber Wu was seen today in follow-up. Overall she is doing well. No new problems since we last saw her. No bleeding  problems. Heart rate is well-controlled with A. fib. She continues to have vocal tremor. She plans to see a specialist for this. Weight is pretty stable but of encouraged her to continue working on getting the weight down.  PMHx:  Past Medical History:  Diagnosis Date  . Allergy   . Arthritis   . Atrial fibrillation ([place])    2D ECHO, 08/12/2012 - EF >55%, LA moderately dilated, RA moderately dilated, moderate tricuspid valve regurgitation  . Barrett's esophagus   . COPD (chronic obstructive pulmonary disease) ([place])   . Depression   . Gastroparesis   . GERD (gastroesophageal reflux disease)   . Hiatal hernia   . History of esophageal stricture   . Hypertension   . Hypothyroidism   . Shortness of breath dyspnea     Past Surgical History:  Procedure Laterality Date  . ABDOMINAL HYSTERECTOMY  1970  . CARDIOVERSION  09/14/2012   Procedure: CARDIOVERSION;  Surgeon: Pixie Casino, MD;  Location[facility] ENDOSCOPY;  Service: Cardiovascular;  Laterality: N/A;  . CATARACT EXTRACTION Bilateral 06/2014-08/2014  . COLONOSCOPY    .  FACIAL COSMETIC SURGERY  2000  . KNEE SURGERY Right   . ORIF HUMERUS FRACTURE Right 07/06/2015   Procedure: OPEN REDUCTION INTERNAL FIXATION (ORIF) RIGHT PROXIMAL HUMERUS FRACTURE;  Surgeon: Justice Britain, MD;  Location: [place];  Service: Orthopedics;  Laterality: Right;    FAMHx:  Family History  Problem Relation Age of Onset  . Colon cancer Neg Hx     SOCHx:   reports that she quit smoking about 23 years ago. Her smoking use included Cigarettes. She has a 7.50 pack-year smoking history. She has never used smokeless tobacco. She reports that she does not drink alcohol or use drugs.  ALLERGIES:  Allergies  Allergen Reactions  . Antihistamines, Chlorpheniramine-Type Other (See Comments)    INTENSE SHAKING AND UTI  . Antihistamines, Diphenhydramine-Type Other (See Comments)    INTENSE SHAKING AND UTI  . Antihistamines, Loratadine-Type Other (See Comments)     INTENSE SHAKING AND UTI    ROS: Pertinent items noted in HPI and remainder of comprehensive ROS otherwise negative.  HOME MEDS: Current Outpatient Prescriptions  Medication Sig Dispense Refill  . allopurinol (ZYLOPRIM) 100 MG tablet Take 100 mg by mouth 2 (two) times daily.     Marland Kitchen amLODipine (NORVASC) 10 MG tablet Take 10 mg by mouth daily.  5  . aspirin 81 MG tablet Take 81 mg by mouth daily.      . Cholecalciferol (VITAMIN D-3) 1000 UNITS CAPS Take 1,000 Units by mouth daily.    . citalopram (CELEXA) 20 MG tablet Take 1 tablet (20 mg total) by mouth daily. 30 tablet 0  . dabigatran (PRADAXA) 150 MG CAPS capsule Take 1 capsule (150 mg total) by mouth 2 (two) times daily. 180 capsule 3  . dexlansoprazole (DEXILANT) 60 MG capsule Take 60 mg by mouth daily.      . famotidine (PEPCID) 20 MG tablet Take 20 mg by mouth at bedtime.    . furosemide (LASIX) 40 MG tablet Take 1 tablet by mouth daily.     Marland Kitchen levothyroxine (SYNTHROID, LEVOTHROID) 150 MCG tablet Take 150 mcg by mouth daily before breakfast.    . Multiple Vitamin (MULTIVITAMIN) tablet Take 1 tablet by mouth daily.    . Psyllium (METAMUCIL PO) Take 1 Package by mouth daily.    . rosuvastatin (CRESTOR) 10 MG tablet Take 10 mg by mouth daily.      . TOPROL XL 50 MG 24 hr tablet Take 1 tablet by mouth 2 (two) times daily.     No current facility-administered medications for this visit.    Facility-Administered Medications Ordered in Other Visits  Medication Dose Route Frequency Provider Last Rate Last Dose  . sodium chloride 0.9 % injection 3 mL  3 mL Intravenous PRN Pixie Casino, MD        LABS/IMAGING: No results found for this or any previous visit (from the past 48 hour(s)). No results found.  VITALS: BP 130/70   Pulse 80   Ht 5\' 5"  (1.651 m)   Wt 251 lb 12.8 oz (114.2 kg)   LMP  (LMP Unknown)   BMI 41.90 kg/m   EXAM: General appearance: alert and no distress, morbidly obese Neck: Left neck mass, firm no carotid  bruit, no JVD Lungs: clear to auscultation bilaterally Heart: irregularly irregular rhythm Abdomen: Morbidly obese Extremities: extremities normal, atraumatic, trace bilateral edema Pulses: 2+ and symmetric Skin: Skin color, texture, turgor normal. No rashes or lesions Neurologic: Grossly normal Psych: Mildly anxious  EKG: Atrial fibrillation at 80  ASSESSMENT:  1. Permanent atrial fibrillation on Pradaxa 150 mg twice daily  2. COPD with shortness of breath 3. Hypertension 4. Dyslipidemia 5. Morbid obesity 6. History of esophageal stricture and numerous dilatations 7. Moderately dilated left atrium 8. History of esophageal stricture 9. Vocal tremor 10. Weakness/visual hallucinations  PLAN: 1.   Ms. Wu seems to be doing very well. She denies any chest pain or worsening shortness of breath. She's had no bleeding problems on Pradaxa. A. fib is stable and rate controlled. She needs to continue to work on weight loss. We'll continue her current medications and plan to see her back annually or sooner as necessary.  Pixie Casino, MD, [facility] Attending Cardiologist [place] Hilty 11/22/2016, 4:09 PM

## 2017-02-06 DIAGNOSIS — E032 Hypothyroidism due to medicaments and other exogenous substances: Secondary | ICD-10-CM | POA: Diagnosis not present

## 2017-02-06 DIAGNOSIS — E789 Disorder of lipoprotein metabolism, unspecified: Secondary | ICD-10-CM | POA: Diagnosis not present

## 2017-02-06 DIAGNOSIS — I10 Essential (primary) hypertension: Secondary | ICD-10-CM | POA: Diagnosis not present

## 2017-02-13 DIAGNOSIS — I482 Chronic atrial fibrillation: Secondary | ICD-10-CM | POA: Diagnosis not present

## 2017-02-13 DIAGNOSIS — R05 Cough: Secondary | ICD-10-CM | POA: Diagnosis not present

## 2017-02-13 DIAGNOSIS — E032 Hypothyroidism due to medicaments and other exogenous substances: Secondary | ICD-10-CM | POA: Diagnosis not present

## 2017-03-19 DIAGNOSIS — J383 Other diseases of vocal cords: Secondary | ICD-10-CM | POA: Diagnosis not present

## 2017-03-19 DIAGNOSIS — R1313 Dysphagia, pharyngeal phase: Secondary | ICD-10-CM | POA: Diagnosis not present

## 2017-03-19 DIAGNOSIS — Z87891 Personal history of nicotine dependence: Secondary | ICD-10-CM | POA: Diagnosis not present

## 2017-03-19 DIAGNOSIS — R490 Dysphonia: Secondary | ICD-10-CM | POA: Diagnosis not present

## 2017-03-19 DIAGNOSIS — J343 Hypertrophy of nasal turbinates: Secondary | ICD-10-CM | POA: Diagnosis not present

## 2017-03-22 ENCOUNTER — Other Ambulatory Visit: Payer: Self-pay | Admitting: Internal Medicine

## 2017-03-22 DIAGNOSIS — I4819 Other persistent atrial fibrillation: Secondary | ICD-10-CM

## 2017-03-24 NOTE — Telephone Encounter (Signed)
LM at Dr. Eugenio Hoes office for most recent BMET

## 2017-04-03 DIAGNOSIS — I10 Essential (primary) hypertension: Secondary | ICD-10-CM | POA: Diagnosis not present

## 2017-04-03 DIAGNOSIS — E789 Disorder of lipoprotein metabolism, unspecified: Secondary | ICD-10-CM | POA: Diagnosis not present

## 2017-04-03 DIAGNOSIS — E032 Hypothyroidism due to medicaments and other exogenous substances: Secondary | ICD-10-CM | POA: Diagnosis not present

## 2017-04-03 DIAGNOSIS — E875 Hyperkalemia: Secondary | ICD-10-CM | POA: Diagnosis not present

## 2017-04-03 DIAGNOSIS — N390 Urinary tract infection, site not specified: Secondary | ICD-10-CM | POA: Diagnosis not present

## 2017-04-10 DIAGNOSIS — I4891 Unspecified atrial fibrillation: Secondary | ICD-10-CM | POA: Diagnosis not present

## 2017-04-10 DIAGNOSIS — R0689 Other abnormalities of breathing: Secondary | ICD-10-CM | POA: Diagnosis not present

## 2017-04-10 DIAGNOSIS — E032 Hypothyroidism due to medicaments and other exogenous substances: Secondary | ICD-10-CM | POA: Diagnosis not present

## 2017-04-10 DIAGNOSIS — I10 Essential (primary) hypertension: Secondary | ICD-10-CM | POA: Diagnosis not present

## 2017-04-14 ENCOUNTER — Encounter (INDEPENDENT_AMBULATORY_CARE_PROVIDER_SITE_OTHER): Payer: Self-pay

## 2017-04-14 ENCOUNTER — Telehealth: Payer: Self-pay | Admitting: Neurology

## 2017-04-14 ENCOUNTER — Encounter: Payer: Self-pay | Admitting: Neurology

## 2017-04-14 ENCOUNTER — Ambulatory Visit (INDEPENDENT_AMBULATORY_CARE_PROVIDER_SITE_OTHER): Payer: Medicare Other | Admitting: Neurology

## 2017-04-14 DIAGNOSIS — J383 Other diseases of vocal cords: Secondary | ICD-10-CM | POA: Diagnosis not present

## 2017-04-14 NOTE — Telephone Encounter (Signed)
Scheduled Patient as a new Start . Patient needs to be combined with Dr. Redmond Baseman . Xeomin. Scheduled Patient and checked her out. New Patient paper work Water engineer.

## 2017-04-14 NOTE — Telephone Encounter (Signed)
If her insurance approved her botulism toxin, please call alternating her injection with her ENT Dr. Redmond Baseman, we can buy and bill through our office.

## 2017-04-14 NOTE — Progress Notes (Signed)
PATIENT: Amber Wu DOB: 09/28/1941  Chief Complaint  Patient presents with  . Dysphonia    She is having an increasing amount of difficulty with speaking and swallowing.  Symptoms have been present at least three years and have progressivlely become worse.  Marland Kitchen PCP    Anda Kraft, MD  . ENT    Melida Quitter, MD - referring MD     HISTORICAL  Amber Wu is a 76 year old right-handed female, seen in refer by ENT Dr. Melida Quitter, for spastic dysphonia, her primary care physician is Dr. Anda Kraft, initial evaluation was on Apr 14 2017.  Reviewed and summarized referring note from Dr. Redmond Baseman, she had a history of atrial fibrillation, on Pradaxa, hypertension, hypothyroidism,   She had long-standing history of bilateral hands tremor, gradual onset voiced tremor for many years, denies family history, her symptoms was gradually getting worse, become intolerable to her since her most recent high school friends reunion, all of her friends comments on her worsening worries quality, she could no longer sing, she also complains of choking occasionally,  She was seen by Dr. Redmond Baseman on March 19 2018, fiberscope examination of the vocal cord and the quality of her worries are consistent with adductor spastic dysphonia, Dr. Redmond Baseman recommended her proceeding with EMG guided laryngeal Botox injection, which will be coordinated through our office.  Laboratory evaluation on April 03 2017, normal CBC with hemoglobin of 14 point 2, normal CMP, creatinine of 1.3, LDL was 62, HDL was 65, cholesterol was 159, TSH was mildly decreased 0.18,  REVIEW OF SYSTEMS: Full 14 system review of systems performed and notable only for shortness of breath, cough, feeling hot, feeling cold, flashing, joint pain, achy muscles, trouble swallowing, allergies, runny nose, skin sensitivity, difficulty swallowing, tremor  ALLERGIES: Allergies  Allergen Reactions  . Antihistamines, Chlorpheniramine-Type Other (See  Comments)    INTENSE SHAKING AND UTI  . Antihistamines, Diphenhydramine-Type Other (See Comments)    INTENSE SHAKING AND UTI  . Antihistamines, Loratadine-Type Other (See Comments)    INTENSE SHAKING AND UTI    HOME MEDICATIONS: Current Outpatient Prescriptions  Medication Sig Dispense Refill  . allopurinol (ZYLOPRIM) 100 MG tablet Take 100 mg by mouth 2 (two) times daily.     Marland Kitchen amLODipine (NORVASC) 10 MG tablet Take 10 mg by mouth daily.  5  . aspirin 81 MG tablet Take 81 mg by mouth daily.      . Cholecalciferol (VITAMIN D-3) 1000 UNITS CAPS Take 1,000 Units by mouth daily.    . citalopram (CELEXA) 20 MG tablet Take 1 tablet (20 mg total) by mouth daily. 30 tablet 0  . dexlansoprazole (DEXILANT) 60 MG capsule Take 60 mg by mouth daily.      . famotidine (PEPCID) 20 MG tablet Take 20 mg by mouth at bedtime.    . furosemide (LASIX) 40 MG tablet Take 1 tablet by mouth daily.     Marland Kitchen HYDROcodone-acetaminophen (NORCO) 7.5-325 MG tablet Take 1 tablet by mouth as needed for moderate pain.    Marland Kitchen levothyroxine (SYNTHROID, LEVOTHROID) 150 MCG tablet Take 150 mcg by mouth daily before breakfast.    . Multiple Vitamin (MULTIVITAMIN) tablet Take 1 tablet by mouth daily.    Marland Kitchen PRADAXA 150 MG CAPS capsule TAKE 1 CAPSULE TWICE A DAY 180 capsule 1  . Psyllium (METAMUCIL PO) Take 1 Package by mouth daily.    . rosuvastatin (CRESTOR) 10 MG tablet Take 10 mg by mouth daily.      Marland Kitchen  TOPROL XL 50 MG 24 hr tablet Take 1 tablet by mouth 2 (two) times daily.     No current facility-administered medications for this visit.    Facility-Administered Medications Ordered in Other Visits  Medication Dose Route Frequency Provider Last Rate Last Dose  . sodium chloride 0.9 % injection 3 mL  3 mL Intravenous PRN Hilty, Nadean Corwin, MD        PAST MEDICAL HISTORY: Past Medical History:  Diagnosis Date  . Allergy   . Arthritis   . Atrial fibrillation ([place])    2D ECHO, 08/12/2012 - EF >55%, LA moderately dilated, RA  moderately dilated, moderate tricuspid valve regurgitation  . Barrett's esophagus   . COPD (chronic obstructive pulmonary disease) ([place])   . Depression   . Dysphonia   . Gastroparesis   . GERD (gastroesophageal reflux disease)   . Hiatal hernia   . History of esophageal stricture   . Hypertension   . Hypothyroidism   . Shortness of breath dyspnea     PAST SURGICAL HISTORY: Past Surgical History:  Procedure Laterality Date  . ABDOMINAL HYSTERECTOMY  1970  . CARDIOVERSION  09/14/2012   Procedure: CARDIOVERSION;  Surgeon: Pixie Casino, MD;  Location: [facility] ENDOSCOPY;  Service: Cardiovascular;  Laterality: N/A;  . CATARACT EXTRACTION Bilateral 06/2014-08/2014  . COLONOSCOPY    . FACIAL COSMETIC SURGERY  2000  . KNEE SURGERY Right   . ORIF HUMERUS FRACTURE Right 07/06/2015   Procedure: OPEN REDUCTION INTERNAL FIXATION (ORIF) RIGHT PROXIMAL HUMERUS FRACTURE;  Surgeon: Justice Britain, MD;  Location: [place];  Service: Orthopedics;  Laterality: Right;    FAMILY HISTORY: Family History  Problem Relation Age of Onset  . Stroke Mother   . Heart disease Father   . Colon cancer Neg Hx     SOCIAL HISTORY:  Social History   Social History  . Marital status: Widowed    Spouse name: N/A  . Number of children: 1  . Years of education: 17   Occupational History  . Retired- Clinical cytogeneticist     Social History Main Topics  . Smoking status: Former Smoker    Packs/day: 0.50    Years: 15.00    Types: Cigarettes    Quit date: 11/09/1993  . Smokeless tobacco: Never Used  . Alcohol use No  . Drug use: No  . Sexual activity: Not on file   Other Topics Concern  . Not on file   Social History Narrative   Lives at home alone.   Her only son has passed away.  She has one adult grandson.   Right-handed.   2 cups caffeine daily.     PHYSICAL EXAM   Vitals:   04/14/17 1516  BP: (!) 157/69  Pulse: 64  Weight: 250 lb 4 oz (113.5 kg)  Height: 5\' 5"  (1.651 m)    Not recorded       Body mass index is 41.64 kg/m.  PHYSICAL EXAMNIATION:  Gen: NAD, conversant, well nourised, obese, well groomed                     Cardiovascular: Regular rate rhythm, no peripheral edema, warm, nontender. Eyes: Conjunctivae clear without exudates or hemorrhage Neck: Supple, no carotid bruits. Pulmonary: Clear to auscultation bilaterally   NEUROLOGICAL EXAM:  MENTAL STATUS: Speech:    She has spastic dysphonia, fluent and spontaneous with normal comprehension.  Cognition:     Orientation to time, place and person     Normal recent  and remote memory     Normal Attention span and concentration     Normal Language, naming, repeating,spontaneous speech     Fund of knowledge   CRANIAL NERVES: CN II: Visual fields are full to confrontation. Fundoscopic exam is normal with sharp discs and no vascular changes. Pupils are round equal and briskly reactive to light. CN III, IV, VI: extraocular movement are normal. No ptosis. CN V: Facial sensation is intact to pinprick in all 3 divisions bilaterally. Corneal responses are intact.  CN VII: Face is symmetric with normal eye closure and smile. CN VIII: Hearing is normal to rubbing fingers CN IX, X: Palate elevates symmetrically. Phonation is normal. CN XI: Head turning and shoulder shrug are intact CN XII: Tongue is midline with normal movements and no atrophy.  MOTOR: She has mild bilateral hands postural tremor. Muscle bulk and tone are normal. Muscle strength is normal.  REFLEXES: Reflexes are 2+ and symmetric at the biceps, triceps, knees, and ankles. Plantar responses are flexor.  SENSORY: Intact to light touch, pinprick, positional sensation and vibratory sensation are intact in fingers and toes.  COORDINATION: Rapid alternating movements and fine finger movements are intact. There is no dysmetria on finger-to-nose and heel-knee-shin.    GAIT/STANCE: Posture is normal. Gait is steady with normal steps, base, arm swing,  and turning. Heel and toe walking are normal. Tandem gait is normal.  Romberg is absent.   DIAGNOSTIC DATA (LABS, IMAGING, TESTING) - I reviewed patient records, labs, notes, testing and imaging myself where available.   ASSESSMENT AND PLAN  Amber Wu is a 76 y.o. female   Spastic dysphonia Essential tremor  Preauthorization for 50 units of xeomin, EMG guided injection with Dr. Redmond Baseman in 3-4 weeks.   Marcial Pacas, M.D. Ph.D.  [facility] [address] [place], [place] [address] Ph: [phone] Fax: [phone]  CC: Melida Quitter, MD, Anda Kraft, MD

## 2017-04-16 DIAGNOSIS — Z1212 Encounter for screening for malignant neoplasm of rectum: Secondary | ICD-10-CM | POA: Diagnosis not present

## 2017-04-16 DIAGNOSIS — Z1211 Encounter for screening for malignant neoplasm of colon: Secondary | ICD-10-CM | POA: Diagnosis not present

## 2017-04-16 LAB — COLOGUARD

## 2017-05-01 ENCOUNTER — Ambulatory Visit: Payer: Medicare Other | Admitting: Physician Assistant

## 2017-05-09 NOTE — Telephone Encounter (Signed)
Patient called and stated that she did not want to come in as early as 8 am for her injections, I told her that Dr. Redmond Baseman does not do afternoon injections but I will call and try to discuss this with their office. I called Dr. Redmond Baseman office and left a VM for his assistant Ailene Ards.

## 2017-05-12 NOTE — Telephone Encounter (Signed)
I called and spoke with the patient who stated she was ok with keeping the 8 am apt.

## 2017-05-14 ENCOUNTER — Ambulatory Visit: Payer: Medicare Other | Admitting: Neurology

## 2017-05-14 ENCOUNTER — Encounter: Payer: Self-pay | Admitting: Neurology

## 2017-05-14 ENCOUNTER — Ambulatory Visit (INDEPENDENT_AMBULATORY_CARE_PROVIDER_SITE_OTHER): Payer: Medicare Other | Admitting: Neurology

## 2017-05-14 VITALS — BP 126/74 | HR 64 | Ht 65.0 in | Wt 250.5 lb

## 2017-05-14 DIAGNOSIS — J383 Other diseases of vocal cords: Secondary | ICD-10-CM | POA: Diagnosis not present

## 2017-05-14 DIAGNOSIS — R490 Dysphonia: Secondary | ICD-10-CM | POA: Diagnosis not present

## 2017-05-14 NOTE — Progress Notes (Signed)
PATIENT: Amber Wu DOB: 05-Jul-1941   HISTORICAL  Amber Wu is a 76 year old right-handed female, seen in refer by ENT Dr. Melida Quitter, for spastic dysphonia, her primary care physician is Dr. Anda Kraft, initial evaluation was on Apr 14 2017.  Reviewed and summarized referring note from Dr. Redmond Baseman, she had a history of atrial fibrillation, on Pradaxa, hypertension, hypothyroidism,   She had long-standing history of bilateral hands tremor, gradual onset voiced tremor for many years, denies family history, her symptoms was gradually getting worse, become intolerable to her since her most recent high school friends reunion, all of her friends comments on her worsening voice quality, she could no longer sing, she also complains of choking occasionally,  She was seen by Dr. Redmond Baseman on March 19 2018, fiberscope examination of the vocal cord and the quality of her worries are consistent with adductor spastic dysphonia, Dr. Redmond Baseman recommended her proceeding with EMG guided laryngeal Botox injection, which will be coordinated through our office.  Laboratory evaluation on April 03 2017, normal CBC with hemoglobin of 14 point 2, normal CMP, creatinine of 1.3, LDL was 62, HDL was 65, cholesterol was 159, TSH was mildly decreased 0.18,  REVIEW OF SYSTEMS: Full 14 system review of systems performed and notable only for shortness of breath, cough, feeling hot, feeling cold, flashing, joint pain, achy muscles, trouble swallowing, allergies, runny nose, skin sensitivity, difficulty swallowing, tremor  ALLERGIES: Allergies  Allergen Reactions  . Antihistamines, Chlorpheniramine-Type Other (See Comments)    INTENSE SHAKING AND UTI  . Antihistamines, Diphenhydramine-Type Other (See Comments)    INTENSE SHAKING AND UTI  . Antihistamines, Loratadine-Type Other (See Comments)    INTENSE SHAKING AND UTI    HOME MEDICATIONS: Current Outpatient Prescriptions  Medication Sig Dispense Refill  .  allopurinol (ZYLOPRIM) 100 MG tablet Take 100 mg by mouth 2 (two) times daily.     Marland Kitchen amLODipine (NORVASC) 10 MG tablet Take 10 mg by mouth daily.  5  . aspirin 81 MG tablet Take 81 mg by mouth daily.      . Cholecalciferol (VITAMIN D-3) 1000 UNITS CAPS Take 1,000 Units by mouth daily.    . citalopram (CELEXA) 20 MG tablet Take 1 tablet (20 mg total) by mouth daily. 30 tablet 0  . dexlansoprazole (DEXILANT) 60 MG capsule Take 60 mg by mouth daily.      . famotidine (PEPCID) 20 MG tablet Take 20 mg by mouth at bedtime.    . furosemide (LASIX) 40 MG tablet Take 1 tablet by mouth daily.     Marland Kitchen HYDROcodone-acetaminophen (NORCO) 7.5-325 MG tablet Take 1 tablet by mouth as needed for moderate pain.    Marland Kitchen levothyroxine (SYNTHROID, LEVOTHROID) 150 MCG tablet Take 150 mcg by mouth daily before breakfast.    . Multiple Vitamin (MULTIVITAMIN) tablet Take 1 tablet by mouth daily.    Marland Kitchen PRADAXA 150 MG CAPS capsule TAKE 1 CAPSULE TWICE A DAY 180 capsule 1  . Psyllium (METAMUCIL PO) Take 1 Package by mouth daily.    . rosuvastatin (CRESTOR) 10 MG tablet Take 10 mg by mouth daily.      . TOPROL XL 50 MG 24 hr tablet Take 1 tablet by mouth 2 (two) times daily.     No current facility-administered medications for this visit.    Facility-Administered Medications Ordered in Other Visits  Medication Dose Route Frequency Provider Last Rate Last Dose  . sodium chloride 0.9 % injection 3 mL  3 mL Intravenous PRN Hilty,  Nadean Corwin, MD        PAST MEDICAL HISTORY: Past Medical History:  Diagnosis Date  . Allergy   . Arthritis   . Atrial fibrillation ([place])    2D ECHO, 08/12/2012 - EF >55%, LA moderately dilated, RA moderately dilated, moderate tricuspid valve regurgitation  . Barrett's esophagus   . COPD (chronic obstructive pulmonary disease) ([place])   . Depression   . Dysphonia   . Gastroparesis   . GERD (gastroesophageal reflux disease)   . Hiatal hernia   . History of esophageal stricture   . Hypertension   .  Hypothyroidism   . Shortness of breath dyspnea     PAST SURGICAL HISTORY: Past Surgical History:  Procedure Laterality Date  . ABDOMINAL HYSTERECTOMY  1970  . CARDIOVERSION  09/14/2012   Procedure: CARDIOVERSION;  Surgeon: Pixie Casino, MD;  Location: [facility] ENDOSCOPY;  Service: Cardiovascular;  Laterality: N/A;  . CATARACT EXTRACTION Bilateral 06/2014-08/2014  . COLONOSCOPY    . FACIAL COSMETIC SURGERY  2000  . KNEE SURGERY Right   . ORIF HUMERUS FRACTURE Right 07/06/2015   Procedure: OPEN REDUCTION INTERNAL FIXATION (ORIF) RIGHT PROXIMAL HUMERUS FRACTURE;  Surgeon: Justice Britain, MD;  Location: [place];  Service: Orthopedics;  Laterality: Right;    FAMILY HISTORY: Family History  Problem Relation Age of Onset  . Stroke Mother   . Heart disease Father   . Colon cancer Neg Hx     SOCIAL HISTORY:  Social History   Social History  . Marital status: Widowed    Spouse name: N/A  . Number of children: 1  . Years of education: 69   Occupational History  . Retired- Clinical cytogeneticist     Social History Main Topics  . Smoking status: Former Smoker    Packs/day: 0.50    Years: 15.00    Types: Cigarettes    Quit date: 11/09/1993  . Smokeless tobacco: Never Used  . Alcohol use No  . Drug use: No  . Sexual activity: Not on file   Other Topics Concern  . Not on file   Social History Narrative   Lives at home alone.   Her only son has passed away.  She has one adult grandson.   Right-handed.   2 cups caffeine daily.     PHYSICAL EXAM   There were no vitals filed for this visit.  Not recorded      There is no height or weight on file to calculate BMI.  PHYSICAL EXAMNIATION:  Gen: NAD, conversant, well nourised, obese, well groomed                     Cardiovascular: Regular rate rhythm, no peripheral edema, warm, nontender. Eyes: Conjunctivae clear without exudates or hemorrhage Neck: Supple, no carotid bruits. Pulmonary: Clear to auscultation bilaterally    NEUROLOGICAL EXAM:  MENTAL STATUS: Speech:    She has spastic dysphonia, fluent and spontaneous with normal comprehension.  Cognition:     Orientation to time, place and person     Normal recent and remote memory     Normal Attention span and concentration     Normal Language, naming, repeating,spontaneous speech     Fund of knowledge   CRANIAL NERVES: CN II: Visual fields are full to confrontation. Fundoscopic exam is normal with sharp discs and no vascular changes. Pupils are round equal and briskly reactive to light. CN III, IV, VI: extraocular movement are normal. No ptosis. CN V: Facial sensation is intact  to pinprick in all 3 divisions bilaterally. Corneal responses are intact.  CN VII: Face is symmetric with normal eye closure and smile. CN VIII: Hearing is normal to rubbing fingers CN IX, X: Palate elevates symmetrically. Phonation is normal. CN XI: Head turning and shoulder shrug are intact CN XII: Tongue is midline with normal movements and no atrophy.  MOTOR: She has mild bilateral hands postural tremor. Muscle bulk and tone are normal. Muscle strength is normal.  REFLEXES: Reflexes are 2+ and symmetric at the biceps, triceps, knees, and ankles. Plantar responses are flexor.  SENSORY: Intact to light touch, pinprick, positional sensation and vibratory sensation are intact in fingers and toes.  COORDINATION: Rapid alternating movements and fine finger movements are intact. There is no dysmetria on finger-to-nose and heel-knee-shin.    GAIT/STANCE: Posture is normal. Gait is steady with normal steps, base, arm swing, and turning. Heel and toe walking are normal. Tandem gait is normal.  Romberg is absent.   DIAGNOSTIC DATA (LABS, IMAGING, TESTING) - I reviewed patient records, labs, notes, testing and imaging myself where available.   ASSESSMENT AND PLAN  Amber Wu is a 76 y.o. female   Essential tremor Spastic dysphonia EMG guided injection of  bilateral thyroidarytenoid muscle, used 1 units at each injection site   Marcial Pacas, M.D. Ph.D.  [facility] [address], [place], [place] [address] Ph: [phone] Fax: [phone]  CC: Melida Quitter, MD, Anda Kraft, MD

## 2017-05-14 NOTE — Progress Notes (Signed)
**  Botox 100 units x 1 vial, NDC 2411-4643-14, Lot [id number], Exp 10/2019, specialty pharmacy.//mck,[email or web address]**

## 2017-05-28 DIAGNOSIS — J383 Other diseases of vocal cords: Secondary | ICD-10-CM | POA: Diagnosis not present

## 2017-05-28 DIAGNOSIS — R490 Dysphonia: Secondary | ICD-10-CM | POA: Diagnosis not present

## 2017-06-06 DIAGNOSIS — H3509 Other intraretinal microvascular abnormalities: Secondary | ICD-10-CM | POA: Diagnosis not present

## 2017-06-06 DIAGNOSIS — H04123 Dry eye syndrome of bilateral lacrimal glands: Secondary | ICD-10-CM | POA: Diagnosis not present

## 2017-06-06 DIAGNOSIS — Z961 Presence of intraocular lens: Secondary | ICD-10-CM | POA: Diagnosis not present

## 2017-06-06 DIAGNOSIS — H35033 Hypertensive retinopathy, bilateral: Secondary | ICD-10-CM | POA: Diagnosis not present

## 2017-06-06 DIAGNOSIS — H26492 Other secondary cataract, left eye: Secondary | ICD-10-CM | POA: Diagnosis not present

## 2017-08-21 ENCOUNTER — Telehealth: Payer: Self-pay | Admitting: Neurology

## 2017-08-21 NOTE — Telephone Encounter (Signed)
Amber Wu from Dr. Redmond Baseman office called and requested the patient be scheduled for an apt the end of September, there are no openings. Please call and advise. [phone].

## 2017-09-04 ENCOUNTER — Ambulatory Visit (INDEPENDENT_AMBULATORY_CARE_PROVIDER_SITE_OTHER): Payer: Medicare Other | Admitting: Neurology

## 2017-09-04 ENCOUNTER — Encounter: Payer: Self-pay | Admitting: Neurology

## 2017-09-04 VITALS — BP 131/61 | HR 102 | Ht 65.0 in | Wt 251.0 lb

## 2017-09-04 DIAGNOSIS — J383 Other diseases of vocal cords: Secondary | ICD-10-CM | POA: Diagnosis not present

## 2017-09-04 DIAGNOSIS — R490 Dysphonia: Secondary | ICD-10-CM | POA: Diagnosis not present

## 2017-09-04 NOTE — Progress Notes (Signed)
PATIENT: Amber Wu DOB: 15-Mar-1941   HISTORICAL  Amber Wu is a 76 year old right-handed female, seen in refer by ENT Dr. Melida Quitter, for spastic dysphonia, her primary care physician is Dr. Anda Kraft, initial evaluation was on Apr 14 2017.  Reviewed and summarized referring note from Dr. Redmond Baseman, she had a history of atrial fibrillation, on Pradaxa, hypertension, hypothyroidism,   She had long-standing history of bilateral hands tremor, gradual onset voiced tremor for many years, denies family history, her symptoms was gradually getting worse, become intolerable to her since her most recent high school friends reunion, all of her friends comments on her worsening voice quality, she could no longer sing, she also complains of choking occasionally,  She was seen by Dr. Redmond Baseman on March 19 2018, fiberscope examination of the vocal cord and the quality of her worries are consistent with adductor spastic dysphonia, Dr. Redmond Baseman recommended her proceeding with EMG guided laryngeal Botox injection, which will be coordinated through our office.  Laboratory evaluation on April 03 2017, normal CBC with hemoglobin of 14 point 2, normal CMP, creatinine of 1.3, LDL was 62, HDL was 65, cholesterol was 159, TSH was mildly decreased 0.18,  UPDATE Sept 27 2018: EMG guided botox injection for spastic dysphonia today, used 2 units of BOTOX A at each side  REVIEW OF SYSTEMS: Full 14 system review of systems performed and notable only for shortness of breath, cough, feeling hot, feeling cold, flashing, joint pain, achy muscles, trouble swallowing, allergies, runny nose, skin sensitivity, difficulty swallowing, tremor  ALLERGIES: Allergies  Allergen Reactions  . Antihistamines, Chlorpheniramine-Type Other (See Comments)    INTENSE SHAKING AND UTI  . Antihistamines, Diphenhydramine-Type Other (See Comments)    INTENSE SHAKING AND UTI  . Antihistamines, Loratadine-Type Other (See Comments)    INTENSE  SHAKING AND UTI    HOME MEDICATIONS: Current Outpatient Prescriptions  Medication Sig Dispense Refill  . allopurinol (ZYLOPRIM) 100 MG tablet Take 100 mg by mouth 2 (two) times daily.     Marland Kitchen amLODipine (NORVASC) 10 MG tablet Take 10 mg by mouth daily.  5  . aspirin 81 MG tablet Take 81 mg by mouth daily.      . botulinum toxin Type A (BOTOX) 100 units SOLR injection Inject 100 Units into the muscle every 3 (three) months.    . Cholecalciferol (VITAMIN D-3) 1000 UNITS CAPS Take 1,000 Units by mouth daily.    . citalopram (CELEXA) 20 MG tablet Take 1 tablet (20 mg total) by mouth daily. 30 tablet 0  . dexlansoprazole (DEXILANT) 60 MG capsule Take 60 mg by mouth daily.      . famotidine (PEPCID) 20 MG tablet Take 20 mg by mouth at bedtime.    . furosemide (LASIX) 40 MG tablet Take 1 tablet by mouth daily.     Marland Kitchen HYDROcodone-acetaminophen (NORCO) 7.5-325 MG tablet Take 1 tablet by mouth as needed for moderate pain.    Marland Kitchen levothyroxine (SYNTHROID, LEVOTHROID) 150 MCG tablet Take 150 mcg by mouth daily before breakfast.    . Multiple Vitamin (MULTIVITAMIN) tablet Take 1 tablet by mouth daily.    Marland Kitchen PRADAXA 150 MG CAPS capsule TAKE 1 CAPSULE TWICE A DAY 180 capsule 1  . Psyllium (METAMUCIL PO) Take 1 Package by mouth daily.    . rosuvastatin (CRESTOR) 10 MG tablet Take 10 mg by mouth daily.      . TOPROL XL 50 MG 24 hr tablet Take 1 tablet by mouth 2 (two) times daily.  No current facility-administered medications for this visit.    Facility-Administered Medications Ordered in Other Visits  Medication Dose Route Frequency Provider Last Rate Last Dose  . sodium chloride 0.9 % injection 3 mL  3 mL Intravenous PRN Hilty, Nadean Corwin, MD        PAST MEDICAL HISTORY: Past Medical History:  Diagnosis Date  . Allergy   . Arthritis   . Atrial fibrillation ([place])    2D ECHO, 08/12/2012 - EF >55%, LA moderately dilated, RA moderately dilated, moderate tricuspid valve regurgitation  . Barrett's  esophagus   . COPD (chronic obstructive pulmonary disease) ([place])   . Depression   . Dysphonia   . Gastroparesis   . GERD (gastroesophageal reflux disease)   . Hiatal hernia   . History of esophageal stricture   . Hypertension   . Hypothyroidism   . Shortness of breath dyspnea     PAST SURGICAL HISTORY: Past Surgical History:  Procedure Laterality Date  . ABDOMINAL HYSTERECTOMY  1970  . CARDIOVERSION  09/14/2012   Procedure: CARDIOVERSION;  Surgeon: Pixie Casino, MD;  Location: [facility] ENDOSCOPY;  Service: Cardiovascular;  Laterality: N/A;  . CATARACT EXTRACTION Bilateral 06/2014-08/2014  . COLONOSCOPY    . FACIAL COSMETIC SURGERY  2000  . KNEE SURGERY Right   . ORIF HUMERUS FRACTURE Right 07/06/2015   Procedure: OPEN REDUCTION INTERNAL FIXATION (ORIF) RIGHT PROXIMAL HUMERUS FRACTURE;  Surgeon: Justice Britain, MD;  Location: [place];  Service: Orthopedics;  Laterality: Right;    FAMILY HISTORY: Family History  Problem Relation Age of Onset  . Stroke Mother   . Heart disease Father   . Colon cancer Neg Hx     SOCIAL HISTORY:  Social History   Social History  . Marital status: Widowed    Spouse name: N/A  . Number of children: 1  . Years of education: 65   Occupational History  . Retired- Clinical cytogeneticist     Social History Main Topics  . Smoking status: Former Smoker    Packs/day: 0.50    Years: 15.00    Types: Cigarettes    Quit date: 11/09/1993  . Smokeless tobacco: Never Used  . Alcohol use No  . Drug use: No  . Sexual activity: Not on file   Other Topics Concern  . Not on file   Social History Narrative   Lives at home alone.   Her only son has passed away.  She has one adult grandson.   Right-handed.   2 cups caffeine daily.     PHYSICAL EXAM   Vitals:   09/04/17 1146  BP: 131/61  Pulse: (!) 102  Weight: 251 lb (113.9 kg)  Height: 5\' 5"  (1.651 m)    Not recorded      Body mass index is 41.77 kg/m.  PHYSICAL EXAMNIATION:  Gen: NAD,  conversant, well nourised, obese, well groomed                     Cardiovascular: Regular rate rhythm, no peripheral edema, warm, nontender. Eyes: Conjunctivae clear without exudates or hemorrhage Neck: Supple, no carotid bruits. Pulmonary: Clear to auscultation bilaterally   NEUROLOGICAL EXAM:  MENTAL STATUS: Speech:    She has spastic dysphonia, fluent and spontaneous with normal comprehension.  Cognition:     Orientation to time, place and person     Normal recent and remote memory     Normal Attention span and concentration     Normal Language, naming, repeating,spontaneous speech  Fund of knowledge   CRANIAL NERVES: CN II: Visual fields are full to confrontation. Fundoscopic exam is normal with sharp discs and no vascular changes. Pupils are round equal and briskly reactive to light. CN III, IV, VI: extraocular movement are normal. No ptosis. CN V: Facial sensation is intact to pinprick in all 3 divisions bilaterally. Corneal responses are intact.  CN VII: Face is symmetric with normal eye closure and smile. CN VIII: Hearing is normal to rubbing fingers CN IX, X: Palate elevates symmetrically. Phonation is normal. CN XI: Head turning and shoulder shrug are intact CN XII: Tongue is midline with normal movements and no atrophy.  MOTOR: She has mild bilateral hands postural tremor. Muscle bulk and tone are normal. Muscle strength is normal.  REFLEXES: Reflexes are 2+ and symmetric at the biceps, triceps, knees, and ankles. Plantar responses are flexor.  SENSORY: Intact to light touch, pinprick, positional sensation and vibratory sensation are intact in fingers and toes.  COORDINATION: Rapid alternating movements and fine finger movements are intact. There is no dysmetria on finger-to-nose and heel-knee-shin.    GAIT/STANCE: Posture is normal. Gait is steady with normal steps, base, arm swing, and turning. Heel and toe walking are normal. Tandem gait is normal.  Romberg  is absent.   DIAGNOSTIC DATA (LABS, IMAGING, TESTING) - I reviewed patient records, labs, notes, testing and imaging myself where available.   ASSESSMENT AND PLAN  Ama Mcmaster Berrie is a 76 y.o. female   Essential tremor Spastic dysphonia EMG guided injection of bilateral thyroidarytenoid muscle, used 2 units at each injection site   Marcial Pacas, M.D. Ph.D.  [facility] [address], [place], [place] [address] Ph: [phone] Fax: [phone]  CC: Melida Quitter, MD, Anda Kraft, MD

## 2017-09-04 NOTE — Progress Notes (Signed)
**  Botox 100 units x 1 vials, NDC 3838-1840-37, Lot [id number], Exp 02/2020, specialty pharmacy.//mck,rn**

## 2017-10-13 DIAGNOSIS — I10 Essential (primary) hypertension: Secondary | ICD-10-CM | POA: Diagnosis not present

## 2017-10-13 DIAGNOSIS — E032 Hypothyroidism due to medicaments and other exogenous substances: Secondary | ICD-10-CM | POA: Diagnosis not present

## 2017-10-13 DIAGNOSIS — E789 Disorder of lipoprotein metabolism, unspecified: Secondary | ICD-10-CM | POA: Diagnosis not present

## 2017-10-16 DIAGNOSIS — I10 Essential (primary) hypertension: Secondary | ICD-10-CM | POA: Diagnosis not present

## 2017-10-16 DIAGNOSIS — M109 Gout, unspecified: Secondary | ICD-10-CM | POA: Diagnosis not present

## 2017-10-16 DIAGNOSIS — N183 Chronic kidney disease, stage 3 (moderate): Secondary | ICD-10-CM | POA: Diagnosis not present

## 2017-10-16 DIAGNOSIS — E7800 Pure hypercholesterolemia, unspecified: Secondary | ICD-10-CM | POA: Diagnosis not present

## 2017-10-16 DIAGNOSIS — I480 Paroxysmal atrial fibrillation: Secondary | ICD-10-CM | POA: Diagnosis not present

## 2017-10-16 DIAGNOSIS — E039 Hypothyroidism, unspecified: Secondary | ICD-10-CM | POA: Diagnosis not present

## 2017-10-23 ENCOUNTER — Encounter: Payer: Self-pay | Admitting: Neurology

## 2017-10-27 ENCOUNTER — Encounter: Payer: Self-pay | Admitting: Neurology

## 2017-10-27 ENCOUNTER — Ambulatory Visit (INDEPENDENT_AMBULATORY_CARE_PROVIDER_SITE_OTHER): Payer: Medicare Other | Admitting: Neurology

## 2017-10-27 VITALS — BP 102/72 | HR 82 | Ht 66.0 in | Wt 252.0 lb

## 2017-10-27 DIAGNOSIS — I480 Paroxysmal atrial fibrillation: Secondary | ICD-10-CM | POA: Diagnosis not present

## 2017-10-27 DIAGNOSIS — E66813 Obesity, class 3: Secondary | ICD-10-CM

## 2017-10-27 DIAGNOSIS — N183 Chronic kidney disease, stage 3 unspecified: Secondary | ICD-10-CM

## 2017-10-27 DIAGNOSIS — I10 Essential (primary) hypertension: Secondary | ICD-10-CM | POA: Diagnosis not present

## 2017-10-27 DIAGNOSIS — Z6841 Body Mass Index (BMI) 40.0 and over, adult: Secondary | ICD-10-CM

## 2017-10-27 NOTE — Progress Notes (Signed)
Chief Complaint  Patient presents with  . New Patient (Initial Visit)    pt alone, rm 10. pt states she doesnt have any difficulty with sleep. she has afib and her MD is wanting her to get checked out.     SLEEP [facility]INE CLINIC   Provider:  Larey Seat, M D  Primary Care Physician:  Jani Gravel, MD   Referring Provider:  Lyman Bishop, M.D.   Chief Complaint  Patient presents with  . New Patient (Initial Visit)    pt alone, rm 10. pt states she doesnt have any difficulty with sleep. she has afib and her MD is wanting her to get checked out.     HPI:  Amber Wu is a 76 y.o. female , seen here as in a referral from Dr. Wilson Singer for evaluation of possible sleep apnea in the setting of atrial fibrillation, morbid obesity, and spasmodic dysphonia.   Mrs. Daughenbaugh is a pleasant 76 year old Caucasian right-handed female patient, whom I see today for the first time on 27 October 2017.  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 Krista Blue,  for spasmodic dysphonia. Mrs. Eddinger has had several spells of atrial fibrillation during times of minimal exertion.  She describes going shopping at [facility]- not lifting anything heavy, and suddenly feeling weak, short of breath and palpitations.  Dr. Debara Pickett has tried to find out what triggers for atrial fibrillation and has urged her for a long time to undergo a sleep study.  I would like to further specify that the patient also has hypothyroidism, essential hypertension, hypercholesterolemia her BMI is 42.5, and the atrial fibrillation is paroxysmal.  She also is reportedly snoring , dreaming very vividly- active.she  feels more fatigued and daytime and she also has gout, and CKD stage III. I reviewed her medication. She had an ablation.  Sleep habits are as follows: Mrs. Tahir has no longer a set bedtime or rise time.  She likes to sleep between 8 and 9 hours.  She turns the TV off at about 11:30 PM goes to the bedroom and start reading for a while.   He may still read for about an hour before going to sleep.  She will sleep until 9 AM or even 9:30 AM. She has 4 times nocturia each night, which fragments for sleep.  She has reported vivid dreams but they do not seem to cluster in the morning hours before she wakes up.  She prefers to sleep on her side because of back pain.  She sleeps on 1 or 2 pillows for head support.  She believes she is not moving a whole lot in bed but her vivid dreams are sometimes almost hallucination..she has seen people next to her bed. She has no auditory hallucinations. She tried to touch one of her visions and this disappeared immediately.  She wakes up refreshed , restored. She has a very dry mouth.   Sleep medical history and family sleep history: 2 brothers and 2 sisters , none with apnea. Her sister uses oxygen .  No night terrors , enuresis and sleep walking. Vivid, lucid dreams.   Social history:  Widowed,  Son died at age 62 same year as his father died 21-Mar-2007.  None smoker- tried it in her twenties. Alcohol: 1-2 drinks a year, caffeine intake 2 cups of coffee in Am  , one soda at lunch, 1 iced tea sweet at dinner.  Exercise program- she owns a bike. Lately too short of breath.  Review of Systems: Out of a complete 14 system review, the patient complains of only the following symptoms, and all other reviewed systems are negative.  shortness of breath, dizziness,  Weakness, back pain and shoulder pain, trouble swallowing, swelling in her legs, ankle edema, palpitations, feeling hot or cold, tremor and spasmodic dysphonia, aching muscles, runny nose.  She reports being allergic to antihistamines. Causing UTI and jitteriness.   Epworth score 5  , Fatigue severity score 60  , depression score 3/15   Social History   Socioeconomic History  . Marital status: Widowed    Spouse name: Not on file  . Number of children: 1  . Years of education: 19  . Highest education level: Not on file  Social Needs  .  Financial resource strain: Not on file  . Food insecurity - worry: Not on file  . Food insecurity - inability: Not on file  . Transportation needs - medical: Not on file  . Transportation needs - non-medical: Not on file  Occupational History  . Occupation: Retired- Clinical cytogeneticist   Tobacco Use  . Smoking status: Former Smoker    Packs/day: 0.50    Years: 15.00    Pack years: 7.50    Types: Cigarettes    Last attempt to quit: 11/09/1993    Years since quitting: 23.9  . Smokeless tobacco: Never Used  Substance and Sexual Activity  . Alcohol use: No    Alcohol/week: 0.0 oz  . Drug use: No  . Sexual activity: Not on file  Other Topics Concern  . Not on file  Social History Narrative   Lives at home alone.   Her only son has passed away.  She has one adult grandson.   Right-handed.   2 cups caffeine daily.    Family History  Problem Relation Age of Onset  . Stroke Mother   . Heart disease Father   . Colon cancer Neg Hx     Past Medical History:  Diagnosis Date  . Allergy   . Arthritis   . Atrial fibrillation ([place])    2D ECHO, 08/12/2012 - EF >55%, LA moderately dilated, RA moderately dilated, moderate tricuspid valve regurgitation  . Barrett's esophagus   . COPD (chronic obstructive pulmonary disease) ([place])   . Depression   . Dysphonia   . Gastroparesis   . GERD (gastroesophageal reflux disease)   . Hiatal hernia   . History of esophageal stricture   . Hypertension   . Hypothyroidism   . Shortness of breath dyspnea     Past Surgical History:  Procedure Laterality Date  . ABDOMINAL HYSTERECTOMY  1970  . CARDIOVERSION N/A 09/14/2012   Performed by Pixie Casino., MD at [facility] ENDOSCOPY  . CATARACT EXTRACTION Bilateral 06/2014-08/2014  . COLONOSCOPY    . FACIAL COSMETIC SURGERY  2000  . KNEE SURGERY Right   . OPEN REDUCTION INTERNAL FIXATION (ORIF) RIGHT PROXIMAL HUMERUS FRACTURE Right 07/06/2015   Performed by Justice Britain, MD at [facility] OR    Current Outpatient  Medications  Medication Sig Dispense Refill  . allopurinol (ZYLOPRIM) 100 MG tablet Take 100 mg by mouth 2 (two) times daily.     Marland Kitchen amLODipine (NORVASC) 10 MG tablet Take 10 mg by mouth daily.  5  . aspirin 81 MG tablet Take 81 mg by mouth daily.      . botulinum toxin Type A (BOTOX) 100 units SOLR injection Inject 100 Units into the muscle every 3 (three) months.    Marland Kitchen  Cholecalciferol (VITAMIN D-3) 1000 UNITS CAPS Take 1,000 Units by mouth daily.    . citalopram (CELEXA) 20 MG tablet Take 1 tablet (20 mg total) by mouth daily. 30 tablet 0  . dexlansoprazole (DEXILANT) 60 MG capsule Take 60 mg by mouth daily.      . famotidine (PEPCID) 20 MG tablet Take 20 mg by mouth at bedtime.    . furosemide (LASIX) 40 MG tablet Take 1 tablet by mouth daily.     Marland Kitchen HYDROcodone-acetaminophen (NORCO) 7.5-325 MG tablet Take 1 tablet by mouth as needed for moderate pain.    Marland Kitchen levothyroxine (SYNTHROID, LEVOTHROID) 150 MCG tablet Take 150 mcg by mouth daily before breakfast.    . Multiple Vitamin (MULTIVITAMIN) tablet Take 1 tablet by mouth daily.    Marland Kitchen PRADAXA 150 MG CAPS capsule TAKE 1 CAPSULE TWICE A DAY 180 capsule 1  . Psyllium (METAMUCIL PO) Take 1 Package by mouth daily.    . rosuvastatin (CRESTOR) 10 MG tablet Take 10 mg by mouth daily.      . TOPROL XL 50 MG 24 hr tablet Take 1 tablet by mouth 2 (two) times daily.     No current facility-administered medications for this visit.    Facility-Administered Medications Ordered in Other Visits  Medication Dose Route Frequency Provider Last Rate Last Dose  . sodium chloride 0.9 % injection 3 mL  3 mL Intravenous PRN Pixie Casino, MD        Allergies as of 10/27/2017 - Review Complete 10/27/2017  Allergen Reaction Noted  . Antihistamines, chlorpheniramine-type Other (See Comments) 06/14/2011  . Antihistamines, diphenhydramine-type Other (See Comments) 06/22/2015  . Antihistamines, loratadine-type Other (See Comments) 06/22/2015    Vitals: BP 102/72    Pulse 82   Ht 5\' 6"  (1.676 m)   Wt 252 lb (114.3 kg)   LMP  (LMP Unknown)   BMI 40.67 kg/m  Last Weight:  Wt Readings from Last 1 Encounters:  10/27/17 252 lb (114.3 kg)   LFY:BOFB mass index is 40.67 kg/m.     Last Height:   Ht Readings from Last 1 Encounters:  10/27/17 5\' 6"  (1.676 m)   The patient is 5.5' and weights 254 pounds - BMI is 42.   Physical exam:General: The patient is awake, alert and appears not in acute distress. The patient is well groomed. She is very short of breath while speaking.  Head: Normocephalic, atraumatic. Neck is supple. Mallampati  3,  neck circumference:17 " . Nasal airflow congested , TMJ is not evident . Retrognathia is not seen.  Cardiovascular:  Regular rate and rhythm , without  murmurs or carotid bruit, and without distended neck veins. Respiratory: Lungs are clear to auscultation. Skin:  Ankle edema,.  Pitting edema grade 2.  Trunk: BMI is 42 . The patient's posture is stooped.  Neurologic exam : The patient is awake and alert, oriented to place and time.    MOCA: Montreal Cognitive Assessment  05/01/2015  Visuospatial/ Executive (0/5) 4  Naming (0/3) 2  Attention: Read list of digits (0/2) 1  Attention: Read list of letters (0/1) 1  Attention: Serial 7 subtraction starting at 100 (0/3) 3  Language: Repeat phrase (0/2) 2  Language : Fluency (0/1) 1  Abstraction (0/2) 0  Delayed Recall (0/5) 5  Orientation (0/6) 6  Total 25  Adjusted Score (based on education) 26     Attention span & concentration ability appears normal.  Speech is fluent,  with dysphonia .  Mood and affect  are appropriate.  Cranial nerves: Pupils are equal and briskly reactive to light. Funduscopic exam without evidence of pallor or edema.  Extraocular movements  in vertical and horizontal planes intact and without nystagmus. Visual fields by finger perimetry are intact. Hearing to finger rub intact.   Facial sensation intact to fine touch.  Facial motor  strength is symmetric and tongue and uvula move midline. Shoulder shrug was symmetrical.   Motor exam: Normal tone, muscle bulk and symmetric strength in all extremities. Sensory:  Fine touch, pinprick and vibration were tested in all extremities. Proprioception tested in the upper extremities was normal. Coordination: Rapid alternating movements in the fingers/hands was normal. Finger-to-nose maneuver  normal without evidence of ataxia, dysmetria or tremor. Gait and station: Patient walks without assistive device. Deep tendon reflexes: in the upper and lower extremities are symmetric and intact. Babinski maneuver response is downgoing.   Assessment:  After physical and neurologic examination, review of laboratory studies,  Personal review of imaging studies, reports of other /same  Imaging studies, results of polysomnography and / or neurophysiology testing and pre-existing records as far as provided in visit., my assessment is   1)   Ms. Grabski presents with multiple risk factors for the presence of obstructive sleep apnea and it is very likely that she will test positive.  She has a high-grade Mallampati, a larger than average neck circumference, her BMI exceeds 40, and her comorbidities include hypertension, CKD, hypothyroidism.  She also presents with ankle edema.    2) atrial fib. She has had paroxysmal atrial fibrillation but reports that in 2016 her heart was shocked back into rhythm- and again she developed atrial fibrillation. She had a pulmonology workup but found no reason for shortness of breath, she has seen Dr. Krista Blue for spasmodic dysphonia and hoarseness.   3) high degree of daytime fatigue - not so much sleepiness, and she denies having ever snored. She reports myalgia , weakness, ataxia.  This will be in Dr. Greer Pickerel speciality. She is on statins.    The patient was advised of the nature of the diagnosed disorder , the treatment options and the  risks for general health and wellness  arising from not treating the condition.   I spent more than 45 minutes of face to face time with the patient.  Greater than 50% of time was spent in counseling and coordination of care. We have discussed the diagnosis and differential and I answered the patient's questions.    Plan:  Treatment plan and additional workup :  Today, the patient is in regular sinus rhythm .  She presents with mild hand tremor, and vocal tremor. She is not thrilled with the perspective of a sleep study but I will order a attended sleep study by split-night protocol.  The study should be split once an AHI of 20/h has been reached.  If the patient suffers from hypoxemia- as one her first sisters does- she may need o2  Supplementation.I will ask for Co2 peak values.  Her weakness is not just associated with paroxysmal a fib.  She attributed loss of muscle strength to a fall, and she dislocated her shoulder in 2016, and ever since she has been weaker, wide based gait and myalgia.      Larey Seat, MD 33/82/5053, 9:76 PM  Certified in Neurology by ABPN Certified in [place] by [facility] [address], [place], [place] [address]

## 2017-10-27 NOTE — Patient Instructions (Signed)

## 2018-03-09 ENCOUNTER — Ambulatory Visit (INDEPENDENT_AMBULATORY_CARE_PROVIDER_SITE_OTHER): Payer: Medicare Other | Admitting: Neurology

## 2018-03-09 DIAGNOSIS — I480 Paroxysmal atrial fibrillation: Secondary | ICD-10-CM

## 2018-03-09 DIAGNOSIS — G4733 Obstructive sleep apnea (adult) (pediatric): Secondary | ICD-10-CM | POA: Diagnosis not present

## 2018-03-09 DIAGNOSIS — N183 Chronic kidney disease, stage 3 unspecified: Secondary | ICD-10-CM

## 2018-03-09 DIAGNOSIS — I10 Essential (primary) hypertension: Secondary | ICD-10-CM

## 2018-03-09 DIAGNOSIS — G4761 Periodic limb movement disorder: Secondary | ICD-10-CM

## 2018-03-13 NOTE — Procedures (Signed)
PATIENT'S NAME:  Amber Wu, Amber Wu DOB:      09-Apr-1941      MR#:    [id number]     DATE OF RECORDING: 03/09/2018 REFERRING M.D.:  Dr. Debara Pickett and Dr.Yan, M.D. Study Performed:   Baseline Polysomnogram HISTORY:  Amber Wu is a pleasant 77 year old Caucasian right-handed female patient, whom I see today for the first time on 27 October 2017.  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 Amber Wu has had several spells of atrial fibrillation during times of minimal exertion.  She describes going shopping at [facility]- not lifting anything heavy, and suddenly feeling weak, short of breath with palpitations.  Dr. Debara Pickett has urged her for a long time to undergo a sleep study. She had an ablation. The patient also has hypothyroidism, essential hypertension, hypercholesterolemia, she has gout, and CKD stage III, and the atrial fibrillation is paroxysmal.  She also is reportedly snoring, dreaming very vividly- active. She feels more fatigued and daytime I reviewed her medication.   The patient endorsed the Epworth Sleepiness Scale at 5/24 points.  The patient's weight 252 pounds with a height of 66 (inches), resulting in a BMI of 42 kg/m2.The patient's neck circumference measured 17 inches.  CURRENT MEDICATIONS: Allopurinol, Amlodipine, Aspirin, Botulinum, Cholecalciferol, Citalopram, Dexlansoprazole, Famotidine, Furosemide, Hydrocodone, Levothyroxine, Multi-Vitamin, Pradaxa, Psyllium, Rosuvastatin and Toprol XL.   PROCEDURE:  This is a multichannel digital polysomnogram utilizing the SomnoStar 11.2 system.  Electrodes and sensors were applied and monitored per AASM Specifications.   EEG, EOG, Chin and Limb EMG, were sampled at 200 Hz.  ECG, Snore and Nasal Pressure, Thermal Airflow, Respiratory Effort, CPAP Flow and Pressure, Oximetry was sampled at 50 Hz. Digital video and audio were recorded.      BASELINE STUDY: Lights Out was at 21:37 and Lights On at 05:13.  Total  recording time (TRT) was 457 minutes, with a total sleep time (TST) of 227 minutes.   The patient's sleep latency was 208.5 minutes.  REM latency was 0 minutes.  The sleep efficiency was very poor at 49.7 %.     SLEEP ARCHITECTURE: WASO (Wake after sleep onset) was 188.5 minutes.  There were 27.5 minutes in Stage N1, 199.5 minutes Stage N2, 0 minutes Stage N3 and 0 minutes in Stage REM.  The percentage of Stage N1 was 12.1%, Stage N2 was 87.9%, Stage N3 was 0% and Stage R (REM sleep) was 0%.   RESPIRATORY ANALYSIS:  There were a total of 175 respiratory events:  30 obstructive apneas, 145 hypopneas with 0 respiratory event related arousals (RERAs).The total APNEA/HYPOPNEA INDEX (AHI) was 46.3/hour and the total RESPIRATORY DISTURBANCE INDEX was 46.3 /hour.  0 events occurred in REM sleep and 290 events in NREM. The REM AHI was 0 /hour, versus a non-REM AHI of 46.3/h. The patient spent 28 minutes of total sleep time in the supine position and 199 minutes in non-supine. The supine AHI was 38.6/h versus a non-supine AHI of 47.3/h.  OXYGEN SATURATION & C02:  The Wake baseline 02 saturation was 92%, with the lowest being 78%. Time spent below 89% saturation equaled 117 minutes.   PERIODIC LIMB MOVEMENTS:  The patient had a total of 161 Periodic Limb Movements.  The Periodic Limb Movement (PLM) index was 42.6 and the PLM Arousal index was 6.3/hour. The arousals were noted as: 20 were spontaneous, 24 were associated with PLMs, and 175 were associated with respiratory events. Audio and video analysis did not show any  abnormal or unusual movements, behaviors, phonations or vocalizations. The patient was very, very restless and took one bathroom breaks. Mild Snoring was noted. EKG documented atrial fibrillation.   Post-study, the patient indicated that sleep was much worse than usual.   IMPRESSION: This patient clearly has severe sleep apnea and hypopnea with obstructive character.   1. Obstructive Sleep Apnea  (OSA) at AHI 46.1/h, without positional component and with prolonged hypoxemia. 2. Severe Periodic Limb Movement Disorder (PLMD).  RECOMMENDATIONS:  1. In order to help convert to sinus rhythm and maintain sinus rhythm, this degree of apnea will need to be treated with CPAP therapy. I like for the patient to return for a full night titration to CPAP and to be able to see its effect on PLMs.  2. Avoid sedative-hypnotics which may worsen sleep apnea, including alcohol and tobacco (as applicable). 3. Advise to lose weight by diet and exercise (BMI over 40). 4. There were frequent periodic limb movements of sleep (PLMS) with associated sleep disruption.  Consider treating the PLMS if they correlate to RLS.   5. Further information may be obtained from [employer] (www.sleepfoundation.org).   6. Correlate clinically for a history consistent with regarding restless legs syndrome (RLS).    Consider secondary restless legs syndrome.  Pharmacotherapy may be warranted.  Obtain a serum ferritin level if the clinical history is consistent with RLS.  Certain medications or substances may aggravate RLS and common offenders may include the following:  nicotine, caffeine, SSRIs, TCAs, phenothiazine, dopamine antagonists, diphenhydramine, and alcohol.   7. A follow up appointment will be scheduled in the Sleep Clinic at [facility]. The referring provider will be notified of the results.      I certify that I have reviewed the entire raw data recording prior to the issuance of this report in accordance with the Standards of Accreditation of the American Academy of Sleep Medicine (AASM)  Larey Seat, MD    03-13-2018  Diplomat, American Board of Psychiatry and Neurology  Diplomat, American Board of [place] Director, [facility] [employer]

## 2018-03-13 NOTE — Addendum Note (Signed)
Addended by: Larey Seat on: 03/13/2018 01:28 PM   Modules accepted: Orders

## 2018-03-16 ENCOUNTER — Encounter: Payer: Self-pay | Admitting: Internal Medicine

## 2018-03-16 ENCOUNTER — Telehealth: Payer: Self-pay

## 2018-03-16 ENCOUNTER — Ambulatory Visit (INDEPENDENT_AMBULATORY_CARE_PROVIDER_SITE_OTHER): Payer: Medicare Other | Admitting: Internal Medicine

## 2018-03-16 VITALS — BP 154/79 | HR 87 | Ht 68.0 in | Wt 239.2 lb

## 2018-03-16 DIAGNOSIS — Z6841 Body Mass Index (BMI) 40.0 and over, adult: Secondary | ICD-10-CM

## 2018-03-16 DIAGNOSIS — R498 Other voice and resonance disorders: Secondary | ICD-10-CM | POA: Diagnosis not present

## 2018-03-16 DIAGNOSIS — I481 Persistent atrial fibrillation: Secondary | ICD-10-CM | POA: Diagnosis not present

## 2018-03-16 DIAGNOSIS — R0609 Other forms of dyspnea: Secondary | ICD-10-CM

## 2018-03-16 NOTE — Progress Notes (Signed)
OFFICE NOTE  Chief Complaint:  Tremor, weakness  Primary Care Physician: Jani Gravel, MD  HPI:  Amber Wu is a 77 year old female who has a history of COPD, morbid obesity, gout, hypertension and multiple other problems, including esophageal stricture and numerous dilatations. She was found to be in A-fib and you appropriately started her on Pradaxa as well as a beta blocker. 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 She says her handwriting is now more readable. She seems to be tolerating the Pradaxa without any adverse bleeding complications. She has been on that for now 1 month and therefore should be at low risk of having an intracardiac thrombus. Unfortunately, her heart remains in A-fib with a better controlled ventricular response at 99. She is only mildly aware of this A-fib. We discussed pros and cons of A-fib. However, given the fact that this is fairly new onset, I think she deserves an opportunity to try to get back to sinus rhythm. We therefore will pursue an elective DC cardioversion in the next week or so. I will go ahead and keep you informed of the progress of that. Finally, we did an echocardiogram. Her LV systolic function and diastolic function appear to be preserved; however, the left atrium is moderately dilated with a volume of 34 mL/meter squared and otherwise no significant valvular disease. After her last visit she underwent an elective cardioversion, which was unsuccessful at restoring sinus rhythm. Therefore we have elected to manage her with rate control and anticoagulation. Wu her main complaint is shortness of breath and fatigue which is not much different than it has been in the past.   She seems to be tolerating her Pradaxa. She's had no bleeding complaints with this. Her atrial fibrillation is rate controlled. She did report  an episode over the holidays where she had significant lower extremity swelling. She was in [place] and contemplated going to the emergency room ultimately took some of her friend's Lasix. This did improve her swelling and it has since not come back. She denies eating too much salt, but when questioned about this she said that salt is good for her fact it was recommended because of her thyroid problems? Which did not make much sense to me.  Amber Wu returns Wu for followup. She feels easily she's been having progressive shortness of breath. She does not think this is attributed to her weight of because it is been stable. She also had noted that she is unable to do most activities. She gets progressively weak and fatigued doing simple activities such as grocery shopping. She noted the other day that she became profusely diuretic when walking through [employer] which was unusual for her. Despite this, she does not report any chest pain.  Some still back in the office Wu. Again she is complaining of shortness of breath with exertion. Her stress test was performed in the fall which was negative for ischemia. EF was not reported due to lack of gating. She's not had an echo in several years. She has a notable tremor and a significant vocal tremor. 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 I wonder if that's playing a role in her upper airway and causing her to be short of breath. Could also be some degree of diastolic or systolic dysfunction. Finally, she is reported a mass in her  left neck. She's felt swelling has been getting worse over the past several months. She is scheduled to see her primary care provider in about a month.  Amber Wu returns Wu for follow-up. In the interim she is seen Dr. Melvyn Novas with pulmonary who does not feel she has COPD. He is concerned about upper airway pathology as MI with vocal tremor and change in her symptoms. This could be worsening her shortness of  breath. She also reports profound weakness and and recently has been having hallucinations. I wonder if there is an underlying neurologic responsible for her symptoms. She did have an echo which shows normal systolic function. She may have a degree of diastolic dysfunction and recently blood pressure has been elevated. She reports some more leg swelling.  I saw Amber Wu in follow-up. She's had a very eventful several months. She recently fell and fractured her right humerus. She is underwent rehabilitation for that seems to be doing much better. When I last saw her she was having significant complaints of shortness of breath and trouble with her voice. I referred her to neurology who felt that this was likely a vocal tremor. She said at the time of that appointment that it was not that bothersome however indicated was much more significant to me. She still has some significant shortness of breath and has fortunately managed to lose a few pounds which I think will be helpful. She feels a lot of her symptoms may be due to anxiety and has had some improvement with Valium. She wants to talk to her primary care provider about some long-term treatments for anxiety. She does take Celexa 20 mg daily which she's been on for some time.  Amber Wu returns Wu for follow-up. Overall she is doing very well. She's had some improvement in her tremor on beta blocker. She's interested in a refill on her Celexa Wu. She says her primary care provider for some reason will not provide this for her. She says that she's done better on this medicine with regards to anxiety. She appears to have good cholesterol control on Crestor. Unfortunately weight is been stable. She will need to get that lower. Her blood pressure is at goal. No bleeding problems on Pradaxa.  11/22/2016  Amber Wu was seen Wu in follow-up. Overall she is doing well. No new problems since we last saw her. No bleeding problems. Heart  rate is well-controlled with A. fib. She continues to have vocal tremor. She plans to see a specialist for this. Weight is pretty stable but of encouraged her to continue working on getting the weight down.  03/16/2018  Amber Wu returns Wu for follow-up.  Her main complaints Wu have to do with tremor and progressive weakness.  She also is reporting back pain.  She is followed by Dr. Krista Blue in neurology as well as Dr. Brett Fairy (mostly for sleep apnea) -she has a fitting for CPAP coming up.  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  Recently she has had labile hypertension and was noted to be hypotensive in the neurologist office.  She had an episode at home where she had extreme weakness, felt presyncopal and was diaphoretic.  I suspect her blood pressure was low although was not checked.  Wu her blood pressure is actually high at 154/79.  She has had about 12 pound weight loss since I last saw her.  Labs from November 2018 showed total cholesterol 153,  HDL 61, LDL 62 and triglycerides 152, hemoglobin A1c of 5.8.  PMHx:  Past Medical History:  Diagnosis Date  . Allergy   . Arthritis   . Atrial fibrillation ([place])    2D ECHO, 08/12/2012 - EF >55%, LA moderately dilated, RA moderately dilated, moderate tricuspid valve regurgitation  . Barrett's esophagus   . COPD (chronic obstructive pulmonary disease) ([place])   . Depression   . Dysphonia   . Gastroparesis   . GERD (gastroesophageal reflux disease)   . Hiatal hernia   . History of esophageal stricture   . Hypertension   . Hypothyroidism   . Shortness of breath dyspnea     Past Surgical History:  Procedure Laterality Date  . ABDOMINAL HYSTERECTOMY  1970  . CARDIOVERSION  09/14/2012   Procedure: CARDIOVERSION;  Surgeon: Pixie Casino, MD;  Location: [facility] ENDOSCOPY;  Service: Cardiovascular;  Laterality: N/A;  . CATARACT EXTRACTION Bilateral 06/2014-08/2014  . COLONOSCOPY    . FACIAL COSMETIC SURGERY   2000  . KNEE SURGERY Right   . ORIF HUMERUS FRACTURE Right 07/06/2015   Procedure: OPEN REDUCTION INTERNAL FIXATION (ORIF) RIGHT PROXIMAL HUMERUS FRACTURE;  Surgeon: Justice Britain, MD;  Location: [facility];  Service: Orthopedics;  Laterality: Right;    FAMHx:  Family History  Problem Relation Age of Onset  . Stroke Mother   . Heart disease Father   . Colon cancer Neg Hx     SOCHx:   reports that she quit smoking about 24 years ago. Her smoking use included cigarettes. She has a 7.50 pack-year smoking history. She has never used smokeless tobacco. She reports that she does not drink alcohol or use drugs.  ALLERGIES:  Allergies  Allergen Reactions  . Antihistamines, Chlorpheniramine-Type Other (See Comments)    INTENSE SHAKING AND UTI  . Antihistamines, Diphenhydramine-Type Other (See Comments)    INTENSE SHAKING AND UTI  . Antihistamines, Loratadine-Type Other (See Comments)    INTENSE SHAKING AND UTI    ROS: Pertinent items noted in HPI and remainder of comprehensive ROS otherwise negative.  HOME MEDS: Current Outpatient Medications  Medication Sig Dispense Refill  . allopurinol (ZYLOPRIM) 100 MG tablet Take 100 mg by mouth 2 (two) times daily.     Marland Kitchen amLODipine (NORVASC) 10 MG tablet Take 10 mg by mouth daily.  5  . aspirin 81 MG tablet Take 81 mg by mouth daily.      . botulinum toxin Type A (BOTOX) 100 units SOLR injection Inject 100 Units into the muscle every 3 (three) months.    . Cholecalciferol (VITAMIN D-3) 1000 UNITS CAPS Take 1,000 Units by mouth daily.    . citalopram (CELEXA) 20 MG tablet Take 1 tablet (20 mg total) by mouth daily. 30 tablet 0  . dexlansoprazole (DEXILANT) 60 MG capsule Take 60 mg by mouth daily.      . famotidine (PEPCID) 20 MG tablet Take 20 mg by mouth at bedtime.    . furosemide (LASIX) 40 MG tablet Take 1 tablet by mouth daily.     Marland Kitchen HYDROcodone-acetaminophen (NORCO) 7.5-325 MG tablet Take 1 tablet by mouth as needed for moderate pain.    Marland Kitchen  levothyroxine (SYNTHROID, LEVOTHROID) 150 MCG tablet Take 150 mcg by mouth daily before breakfast.    . Multiple Vitamin (MULTIVITAMIN) tablet Take 1 tablet by mouth daily.    Marland Kitchen PRADAXA 150 MG CAPS capsule TAKE 1 CAPSULE TWICE A DAY 180 capsule 1  . Psyllium (METAMUCIL PO) Take 1 Package by mouth daily.    Marland Kitchen  rosuvastatin (CRESTOR) 10 MG tablet Take 10 mg by mouth daily.      . TOPROL XL 50 MG 24 hr tablet Take 1 tablet by mouth 2 (two) times daily.     No current facility-administered medications for this visit.    Facility-Administered Medications Ordered in Other Visits  Medication Dose Route Frequency Provider Last Rate Last Dose  . sodium chloride 0.9 % injection 3 mL  3 mL Intravenous PRN Karthika Glasper, Amber Corwin, MD        LABS/IMAGING: No results found for this or any previous visit (from the past 48 hour(s)). No results found.  VITALS: BP (!) 154/79   Pulse 87   Ht 5\' 8"  (1.727 m)   Wt 239 lb 3.2 oz (108.5 kg)   LMP  (LMP Unknown)   BMI 36.37 kg/m   EXAM: General appearance: alert and no distress, morbidly obese Neck: Left neck mass, firm no carotid bruit, no JVD Lungs: clear to auscultation bilaterally Heart: irregularly irregular rhythm Abdomen: Morbidly obese Extremities: extremities normal, atraumatic, trace bilateral edema Pulses: 2+ and symmetric Skin: Skin color, texture, turgor normal. No rashes or lesions Neurologic: Marked dysphonia and axial tremor Psych: Mildly anxious  EKG: A. fib at 87, nonspecific ST and T wave changes-personally reviewed  ASSESSMENT: 1. Permanent atrial fibrillation on Pradaxa 150 mg twice daily  2. COPD with shortness of breath 3. Hypertension 4. Dyslipidemia 5. Morbid obesity 6. History of esophageal stricture and numerous dilatations 7. Moderately dilated left atrium 8. History of esophageal stricture 9. Vocal tremor 10. Weakness/visual hallucinations  PLAN: 1.   Ms. Gulledge is reporting worsening axial tremor as well as  dysphonia.  She is followed by Dr. Krista Blue in neurology.  I will defer to her as to whether or not this could represent some type of movement disorder.  She is also reporting labile hypertension with episodes of hypotension for which she has been symptomatic.  She may need a reduction in her blood pressure medications.  Blood pressure Wu actually was hypertensive.  She reports no issues with Pradaxa.  She remains in A. fib which is rate controlled.  She has had some recent weight loss, but she says this is due to not feeling well and eating less.  Went to be fitted with CPAP next month.  Follow-up with me annually or sooner as necessary.  Pixie Casino, MD, [facility], [place] Director of the Advanced Lipid Disorders &  Cardiovascular Risk Reduction Clinic Diplomate of the American Board of Clinical Lipidology Attending Cardiologist  Direct Dial: [phone]  Fax: [phone]  Website:  [email or web address]   Amber Wu 03/16/2018, 2:13 PM

## 2018-03-16 NOTE — Patient Instructions (Addendum)
Your physician recommends that you continue on your current medications as directed. Please refer to the Current Medication list given to you today.  Your physician wants you to follow-up in: ONE YEAR with Dr. Hilty. You will receive a reminder letter in the mail two months in advance. If you don't receive a letter, please call our office to schedule the follow-up appointment.  

## 2018-03-16 NOTE — Telephone Encounter (Signed)
Called pt to schedule CPAP titration study.  Explained to pt results of study.  Pt understood and answered her questions.  Pt agreed for nee of CPAP.  Pt is scheduled for 04/12/18 at 8pm.

## 2018-04-12 ENCOUNTER — Ambulatory Visit (INDEPENDENT_AMBULATORY_CARE_PROVIDER_SITE_OTHER): Payer: Medicare Other | Admitting: Neurology

## 2018-04-12 DIAGNOSIS — I10 Essential (primary) hypertension: Secondary | ICD-10-CM

## 2018-04-12 DIAGNOSIS — N183 Chronic kidney disease, stage 3 unspecified: Secondary | ICD-10-CM

## 2018-04-12 DIAGNOSIS — I480 Paroxysmal atrial fibrillation: Secondary | ICD-10-CM

## 2018-04-12 DIAGNOSIS — G4733 Obstructive sleep apnea (adult) (pediatric): Secondary | ICD-10-CM | POA: Diagnosis not present

## 2018-04-12 DIAGNOSIS — G4761 Periodic limb movement disorder: Secondary | ICD-10-CM

## 2018-04-14 DIAGNOSIS — I10 Essential (primary) hypertension: Secondary | ICD-10-CM | POA: Diagnosis not present

## 2018-04-14 DIAGNOSIS — N390 Urinary tract infection, site not specified: Secondary | ICD-10-CM | POA: Diagnosis not present

## 2018-04-14 DIAGNOSIS — E039 Hypothyroidism, unspecified: Secondary | ICD-10-CM | POA: Diagnosis not present

## 2018-04-14 DIAGNOSIS — M109 Gout, unspecified: Secondary | ICD-10-CM | POA: Diagnosis not present

## 2018-04-20 NOTE — Addendum Note (Signed)
Addended by: Larey Seat on: 04/20/2018 07:21 PM   Modules accepted: Orders

## 2018-04-20 NOTE — Procedures (Signed)
PATIENT'S NAME:  Amber Wu, Amber Wu DOB:      [birth year]      MR#:    [id number]     DATE OF RECORDING: 04/12/2018 REFERRING M.D.:  Marcial Pacas, MD   Study Performed:   Titration to CPAP. HISTORY:  Mrs. Amber Wu is a 77 year old female patient who returns for CPAP titration following PSG performed on 03/09/18 resulting in a Diagnosis of severe OSA, with AHI of 46.3, severe PLM disorder, prolonged hypoxemia at 117 minutes duration, and a SpO2 nadir of 78%. Cardiac arrhythmia was noted - atrial fib. Has spasmodic dysphonia, Gout, morbid Obesity, hypothyroidism, COPD, and mild snoring.   The patient endorsed the Epworth Sleepiness Scale at 5 points.  The patient's weight 251 pounds with a height of 66 (inches), resulting in a BMI of 40.4 kg/m2.The patient's neck circumference measured 17 inches.  CURRENT MEDICATIONS: Allopurinol, Amlodipine, Aspirin, Botulinum, Cholecalciferol, Citalopram, Dexlansoprazole, Famotidine, Furosemide, Hydrocodone, Levothyroxine, Multi-Vitamin, Pradaxa, Psyllium, Rosuvastatin and Toprol XL.   PROCEDURE:  This is a multichannel digital polysomnogram utilizing the SomnoStar 11.2 system.  Electrodes and sensors were applied and monitored per AASM Specifications.   EEG, EOG, Chin and Limb EMG, were sampled at 200 Hz.  ECG, Snore and Nasal Pressure, Thermal Airflow, Respiratory Effort, CPAP Flow and Pressure, Oximetry was sampled at 50 Hz. Digital video and audio were recorded.      CPAP was initiated at 5 cmH20 with heated humidity per AASM split night standards and pressure was advanced to 16 cm water. She did well on CPAP of 12 and 13 cm water, but only while sleeping on her side. Apnea and snoring returned with resuming the supine sleep position. The technician switched briefly to BiPAP at 18/14cmH20 because of hypopneas, apneas and snoring- not enough sleep time was observed to recommend this intervention and therapy.    Lights Out was at 21:08 and Lights On at 05:15. Total recording time  (TRT) was 488 minutes, with a total sleep time (TST) of 194.5 minutes. The patient's sleep latency was 111 minutes. REM latency was 0 minutes.  The sleep efficiency was extremely poor at only 39.9 %.    SLEEP ARCHITECTURE: WASO (Wake after sleep onset) was 185 minutes.  There were 49.5 minutes in Stage N1, 92.5 minutes Stage N2, 52.5 minutes Stage N3 and 0 minutes in Stage REM.  The percentage of Stage N1 was 25.4%, Stage N2 was 47.6%, Stage N3 was 27.0%, and Stage R (REM sleep) was 0% of the night.   RESPIRATORY ANALYSIS:  There was a total of 42 respiratory events. There were 42 hypopneas with a hypopnea index of 13.0/hour. The patient also had 1 respiratory event related arousal (RERAs).     The total APNEA/HYPOPNEA INDEX (AHI) was 13.0. /hour and the total RESPIRATORY DISTURBANCE INDEX was 13.3 0./hour  0 events occurred in REM sleep and 42 events in NREM. The REM AHI was 0 /hour versus a non-REM AHI of 13.0/hour. The patient spent 100 minutes of total sleep time in the supine position and 95 minutes in non-supine. The supine AHI was 25.2, versus a non-supine AHI of 0.0.  OXYGEN SATURATION & C02:  The baseline 02 saturation was 96%, with the lowest being 84%. Time spent below 89% saturation equaled 13 minutes.  PERIODIC LIMB MOVEMENTS:  The patient had a total of 362 Periodic Limb Movements. The Periodic Limb Movement (PLM) index was 111.7 and the PLM Arousal index was 7.4 /hour.  The arousals were noted as: 27 were  spontaneous, 24 were associated with PLMs, and 19 were associated with respiratory events. Audio and video analysis did not show any abnormal or unusual movements, behaviors, phonations or vocalizations.  Snoring was noted. EKG was highly irregular - please review attached screen shot.    DIAGNOSIS 1. Obstructive Sleep Apnea with prolonged and sever hypoxemia responded to sleep positional changes. Avoiding sleeping in supine allowed for CPAP at 13 cm water to treat OSA, suppressed  snoring and allowed for an improvement in Nadir. 2. I would prefer to treat insomnia with a sedative rather than forgoing CPAP therapy.   3. PLM disorder.   PLANS/RECOMMENDATIONS:  1. Avoid supine sleep  2. Auto-titration capable CPAP to be set at 13 cm water with 1 cm EPR, heated humidity, and mask of patient's choice .  The patient was fitted with a Respironics Dream wear FFM in small size. 3. Maintain lean body weight. 4. EKG was highly irregular - please review attached screen shot.   A follow up appointment will be scheduled in the Sleep Clinic at [facility].   Please call [phone] with any questions.     I certify that I have reviewed the entire raw data recording prior to the issuance of this report in accordance with the Standards of Accreditation of the American Academy of Sleep Medicine (AASM)    Larey Seat, M.D.    04-20-2018  Diplomat, American Board of Psychiatry and Neurology  Diplomat, [place] [employer] Medical Director, [place] [facility]

## 2018-04-21 ENCOUNTER — Telehealth: Payer: Self-pay | Admitting: Neurology

## 2018-04-21 DIAGNOSIS — E7800 Pure hypercholesterolemia, unspecified: Secondary | ICD-10-CM | POA: Diagnosis not present

## 2018-04-21 DIAGNOSIS — R2681 Unsteadiness on feet: Secondary | ICD-10-CM | POA: Diagnosis not present

## 2018-04-21 DIAGNOSIS — Z0000 Encounter for general adult medical examination without abnormal findings: Secondary | ICD-10-CM | POA: Diagnosis not present

## 2018-04-21 DIAGNOSIS — M25561 Pain in right knee: Secondary | ICD-10-CM | POA: Diagnosis not present

## 2018-04-21 DIAGNOSIS — E039 Hypothyroidism, unspecified: Secondary | ICD-10-CM | POA: Diagnosis not present

## 2018-04-21 DIAGNOSIS — I10 Essential (primary) hypertension: Secondary | ICD-10-CM | POA: Diagnosis not present

## 2018-04-21 DIAGNOSIS — K219 Gastro-esophageal reflux disease without esophagitis: Secondary | ICD-10-CM | POA: Diagnosis not present

## 2018-04-21 NOTE — Telephone Encounter (Signed)
Attempted to call the pt and discuss sleep study results. No answer. Unable to LVM for the patient due to voicemail set up. Will attempt to call another time.

## 2018-04-21 NOTE — Telephone Encounter (Signed)
-----   Message from Larey Seat, MD sent at 04/20/2018  7:21 PM EDT ----- DIAGNOSIS 1. Obstructive Sleep Apnea with prolonged and sever hypoxemia  responded to sleep positional changes. Avoiding sleeping in  supine allowed for CPAP at 13 cm water to treat OSA, suppressed  snoring and allowed for an improvement in Nadir. 2. I would prefer to treat insomnia with a sedative rather than  forgoing CPAP therapy.  3. PLM disorder.   PLANS/RECOMMENDATIONS:  1. Avoid supine sleep  2. Auto-titration capable CPAP to be set at 13 cm water with 1 cm  EPR, heated humidity, and mask of patient's choice . The patient  was fitted with a Respironics Dream wear FFM in small size. 3. Maintain lean body weight. 4. EKG was highly irregular - please review attached screen shot.

## 2018-04-27 DIAGNOSIS — M25561 Pain in right knee: Secondary | ICD-10-CM | POA: Diagnosis not present

## 2018-04-27 DIAGNOSIS — M1711 Unilateral primary osteoarthritis, right knee: Secondary | ICD-10-CM | POA: Diagnosis not present

## 2018-04-27 DIAGNOSIS — M47816 Spondylosis without myelopathy or radiculopathy, lumbar region: Secondary | ICD-10-CM | POA: Diagnosis not present

## 2018-04-27 DIAGNOSIS — M25569 Pain in unspecified knee: Secondary | ICD-10-CM | POA: Diagnosis not present

## 2018-04-27 DIAGNOSIS — M1712 Unilateral primary osteoarthritis, left knee: Secondary | ICD-10-CM | POA: Diagnosis not present

## 2018-04-27 DIAGNOSIS — M549 Dorsalgia, unspecified: Secondary | ICD-10-CM | POA: Diagnosis not present

## 2018-04-27 DIAGNOSIS — M170 Bilateral primary osteoarthritis of knee: Secondary | ICD-10-CM | POA: Diagnosis not present

## 2018-04-27 NOTE — Telephone Encounter (Signed)
Pt returned call.  I advised pt that Dr. Brett Fairy reviewed their sleep study results and found that pt has sleep apnea. Dr. Brett Fairy recommends that pt starts a CPAP. I reviewed PAP compliance expectations with the pt. Pt is agreeable to starting a CPAP. I advised pt that an order will be sent to a DME, [facility], and [facility] will call the pt within about one week after they file with the pt's insurance. [facility] will show the pt how to use the machine, fit for masks, and troubleshoot the CPAP if needed. A follow up appt was made for insurance purposes with Dr. Brett Fairy on Aug 06, 2018 at 1:30 pm. Pt verbalized understanding to arrive 15 minutes early and bring their CPAP. A letter with all of this information in it will be mailed to the pt as a reminder. I verified with the pt that the address we have on file is correct. Pt verbalized understanding of results. Pt had no questions at this time but was encouraged to call back if questions arise.

## 2018-05-08 DIAGNOSIS — M419 Scoliosis, unspecified: Secondary | ICD-10-CM | POA: Diagnosis not present

## 2018-05-08 DIAGNOSIS — M4310 Spondylolisthesis, site unspecified: Secondary | ICD-10-CM | POA: Diagnosis not present

## 2018-05-08 DIAGNOSIS — G20 Parkinson's disease: Secondary | ICD-10-CM | POA: Diagnosis not present

## 2018-05-08 DIAGNOSIS — M5136 Other intervertebral disc degeneration, lumbar region: Secondary | ICD-10-CM | POA: Diagnosis not present

## 2018-05-08 DIAGNOSIS — M545 Low back pain: Secondary | ICD-10-CM | POA: Diagnosis not present

## 2018-05-18 ENCOUNTER — Telehealth: Payer: Self-pay | Admitting: Neurology

## 2018-05-18 NOTE — Telephone Encounter (Signed)
Pt stated she is unable to use cpap machine. She said she is on fluid pills therefore, she has to go to the bathroom every 2 hours. Which means she has to keep taking her mask off and putting it back on. Pt stated she woke up and her mask was full of water. She stated that was a horrible experience for her. Pt is agreeable to doing oxygen if needed but refused to wear cpap machine. Pt reached out to [facility] and they informed the pt she would need to talk to Dr. Brett Fairy about this.

## 2018-05-19 NOTE — Telephone Encounter (Signed)
If her mask filled with water it was likely due to condensation water. The humidifier settings should be adjusted. She has tried  the CPAP how many nights?  She was just set up on 6.5.2019, using it 21 minutes.  If she is not willing to try it 30 days, and 4 hours at night ( I understand the impact of her nocturia), I would refer to inspire procedure.   Set up with Dr. Wilburn Cornelia.   CD

## 2018-05-20 NOTE — Telephone Encounter (Signed)
Robin in our sleep lab has attempted twice to call and discuss the patient's concerns and educate on the importance of using the machine. There was no answer. Was unable to leave a voicemail. She will attempt to call another time.

## 2018-05-27 ENCOUNTER — Telehealth: Payer: Self-pay

## 2018-05-27 DIAGNOSIS — M47816 Spondylosis without myelopathy or radiculopathy, lumbar region: Secondary | ICD-10-CM | POA: Diagnosis not present

## 2018-05-27 DIAGNOSIS — M1711 Unilateral primary osteoarthritis, right knee: Secondary | ICD-10-CM | POA: Diagnosis not present

## 2018-05-27 DIAGNOSIS — M25569 Pain in unspecified knee: Secondary | ICD-10-CM | POA: Diagnosis not present

## 2018-05-27 DIAGNOSIS — M549 Dorsalgia, unspecified: Secondary | ICD-10-CM | POA: Diagnosis not present

## 2018-05-27 DIAGNOSIS — M170 Bilateral primary osteoarthritis of knee: Secondary | ICD-10-CM | POA: Diagnosis not present

## 2018-05-27 NOTE — Telephone Encounter (Signed)
Called patient to discuss cpap, no answer or voicemail

## 2018-06-08 DIAGNOSIS — H26492 Other secondary cataract, left eye: Secondary | ICD-10-CM | POA: Diagnosis not present

## 2018-06-08 DIAGNOSIS — H04123 Dry eye syndrome of bilateral lacrimal glands: Secondary | ICD-10-CM | POA: Diagnosis not present

## 2018-06-08 DIAGNOSIS — H35033 Hypertensive retinopathy, bilateral: Secondary | ICD-10-CM | POA: Diagnosis not present

## 2018-06-08 DIAGNOSIS — Z961 Presence of intraocular lens: Secondary | ICD-10-CM | POA: Diagnosis not present

## 2018-06-08 DIAGNOSIS — H3509 Other intraretinal microvascular abnormalities: Secondary | ICD-10-CM | POA: Diagnosis not present

## 2018-07-10 ENCOUNTER — Other Ambulatory Visit: Payer: Self-pay

## 2018-07-15 ENCOUNTER — Telehealth: Payer: Self-pay | Admitting: *Deleted

## 2018-07-15 DIAGNOSIS — M25561 Pain in right knee: Secondary | ICD-10-CM | POA: Diagnosis not present

## 2018-07-15 DIAGNOSIS — M4310 Spondylolisthesis, site unspecified: Secondary | ICD-10-CM | POA: Diagnosis not present

## 2018-07-15 DIAGNOSIS — G20 Parkinson's disease: Secondary | ICD-10-CM | POA: Diagnosis not present

## 2018-07-15 DIAGNOSIS — M419 Scoliosis, unspecified: Secondary | ICD-10-CM | POA: Diagnosis not present

## 2018-07-15 NOTE — Telephone Encounter (Signed)
   [facility] Pre-operative Risk Assessment    Request for surgical clearance:  What type of surgery is being performed? RIGHT KNEE REPLACEMENT  1. When is this surgery scheduled? TBD  2. What type of clearance is required (medical clearance vs. Pharmacy clearance to hold med vs. Both)? BOTH  3. Are there any medications that need to be held prior to surgery and how long? ASA  4. Practice name and name of physician performing surgery?  [facility]  5. What is your office phone number [phone]   7.   What is your office fax number  [phone] 8.   Anesthesia type (None, local, MAC, general) ? CHOICE   Devra Dopp 07/15/2018, 4:13 PM  _________________________________________________________________   (provider comments below)

## 2018-07-16 DIAGNOSIS — E032 Hypothyroidism due to medicaments and other exogenous substances: Secondary | ICD-10-CM | POA: Diagnosis not present

## 2018-07-16 DIAGNOSIS — I480 Paroxysmal atrial fibrillation: Secondary | ICD-10-CM | POA: Diagnosis not present

## 2018-07-16 DIAGNOSIS — M25561 Pain in right knee: Secondary | ICD-10-CM | POA: Diagnosis not present

## 2018-07-16 DIAGNOSIS — I10 Essential (primary) hypertension: Secondary | ICD-10-CM | POA: Diagnosis not present

## 2018-07-17 NOTE — Telephone Encounter (Signed)
   Primary Cardiologist: Pixie Casino, MD  Chart reviewed as part of pre-operative protocol coverage. Patient was contacted 07/17/2018 in reference to pre-operative risk assessment for pending surgery as outlined below.  Rivkah L Adelstein was last seen on 03/16/2018 by Dr. Debara Pickett.  Since that day, Mckinsey L Pletz has done well.  She has permanent atrial fibrillation, rate controlled and asymptomatic.  She is independent at home doing all of her own housework, cooking, shopping, ADLs.  She exercises for 30 minutes on a stationary bike approximately 4 days/week without any exertional chest discomfort or dyspnea.  She is able to complete greater than 4 METS of activity without significant symptoms.  She has trouble climbing stairs only due to her knee pain.  According to the revised cardiac risk index she has a 0.4% risk of major cardiac event perioperatively.  Therefore, based on ACC/AHA guidelines, the patient would be at acceptable risk for the planned procedure without further cardiovascular testing. We will be available if needed during her hospitalization.     According to our pharmacy protocol:    Patient with diagnosis of Afib on Pradaxa for anticoagulation.    Procedure: knee replacement Date of procedure: TBD  CHADS2-VASc score of  4 (CHF, HTN, AGE, DM2, stroke/tia x 2, CAD, AGE, female)  CrCl 75ml/min   Per office protocol, patient can hold Pradaxa for 3 days prior to procedure.         I will route this recommendation to the requesting party via Epic fax function and remove from pre-op pool.  Please call with questions.  Daune Perch, NP 07/17/2018, 2:02 PM

## 2018-07-17 NOTE — Telephone Encounter (Signed)
The patient does not have known CAD and will be able to hold aspirin. She does have have permanent afib on Pradaxa.   Will route to CVRR first for recommendations regarding holding anticoagulation.  Patient will then need a phone call to assess for any clinical change since last visit.  Daune Perch, AGNP-C [facility] 07/17/2018  9:26 AM

## 2018-07-17 NOTE — Telephone Encounter (Signed)
Patient with diagnosis of Afib on Pradaxa for anticoagulation.    Procedure: knee replacement Date of procedure: TBD  CHADS2-VASc score of  4 (CHF, HTN, AGE, DM2, stroke/tia x 2, CAD, AGE, female)  CrCl 60ml/min   Per office protocol, patient can hold Pradaxa for 3 days prior to procedure.

## 2018-07-28 NOTE — Telephone Encounter (Signed)
Follow Up:    Pt called said Dr Reather Littler office said they had not received her clearance,they need this asap so her surgery can be scheduled.

## 2018-07-28 NOTE — Telephone Encounter (Signed)
Routing to Dr. Tonita Cong.  Burtis Junes, RN, [facility] [address] [place], [place]  [phone]

## 2018-08-03 ENCOUNTER — Ambulatory Visit: Payer: Self-pay | Admitting: Orthopedic Surgery

## 2018-08-05 ENCOUNTER — Ambulatory Visit: Payer: Medicare Other | Admitting: Neurology

## 2018-08-05 ENCOUNTER — Telehealth: Payer: Self-pay | Admitting: Neurology

## 2018-08-05 DIAGNOSIS — Z6841 Body Mass Index (BMI) 40.0 and over, adult: Principal | ICD-10-CM

## 2018-08-05 DIAGNOSIS — G4730 Sleep apnea, unspecified: Secondary | ICD-10-CM

## 2018-08-05 NOTE — Telephone Encounter (Signed)
Pt showed for apt today. But basically was coming thinking that she had to turn the machine in here. Per previous phone notes she never gave the machine a real good effort but she stated her reasoning on why she would like to discontinue the machine. Pt left without being seen today because it was no need since she had been noncompliant. Pt didn't want to reschedule a follow up to discuss any further. Pt states that she never needed to really complete this process. Will send dc orders to [facility] so they will pick up machine.

## 2018-08-13 DIAGNOSIS — M1711 Unilateral primary osteoarthritis, right knee: Secondary | ICD-10-CM | POA: Diagnosis not present

## 2018-08-24 NOTE — Patient Instructions (Addendum)
[place] DEANDRE STANSEL  08/24/2018   Your procedure is scheduled on:  09-02-18    Report to [facility] Main  Entrance    Report to Admitting at 11:00 AM    Call this number if you have problems the morning of surgery [phone]   Remember: Do not eat food or drink liquids :After Midnight. You may have a Clear Liquid Diet from midnight until 7:30 AM. After 7:30 AM, nothing until after surgery.     CLEAR LIQUID DIET   Foods Allowed                                                                     Foods Excluded  Coffee and tea, regular and decaf                             liquids that you cannot  Plain Jell-O in any flavor                                             see through such as: Fruit ices (not with fruit pulp)                                     milk, soups, orange juice  Iced Popsicles                                    All solid food Carbonated beverages, regular and diet                                    Cranberry, grape and apple juices Sports drinks like Gatorade Lightly seasoned clear broth or consume(fat free) Sugar, honey syrup  Sample Menu Breakfast                                Lunch                                     Supper Cranberry juice                    Beef broth                            Chicken broth Jell-O                                     Grape juice  Apple juice Coffee or tea                        Jell-O                                      Popsicle                                                Coffee or tea                        Coffee or tea  _____________________________________________________________________      Take these medicines the morning of surgery with A SIP OF WATER: Allopurinol (Zyloprim), Citalopram (Celexa), Levothyroxine (Synthroid), Metoprolol Succinate (Toprol-XL). You may bring and use your eyedrops as needed.              BRUSH YOUR TEETH MORNING OF SURGERY AND RINSE  YOUR MOUTH OUT, NO CHEWING GUM CANDY OR MINTS.              You may not have any metal on your body including hair pins and              piercings  Do not wear jewelry, make-up, lotions, powders or perfumes, deodorant             Do not wear nail polish.  Do not shave  48 hours prior to surgery.                Do not bring valuables to the hospital. [place].  Contacts, dentures or bridgework may not be worn into surgery.  Leave suitcase in the car. After surgery it may be brought to your room.   Special Instructions: N/A              Please read over the following fact sheets you were given: _____________________________________________________________________             [facility] - Preparing for Surgery Before surgery, you can play an important role.  Because skin is not sterile, your skin needs to be as free of germs as possible.  You can reduce the number of germs on your skin by washing with CHG (chlorahexidine gluconate) soap before surgery.  CHG is an antiseptic cleaner which kills germs and bonds with the skin to continue killing germs even after washing. Please DO NOT use if you have an allergy to CHG or antibacterial soaps.  If your skin becomes reddened/irritated stop using the CHG and inform your nurse when you arrive at Short Stay. Do not shave (including legs and underarms) for at least 48 hours prior to the first CHG shower.  You may shave your face/neck. Please follow these instructions carefully:  1.  Shower with CHG Soap the night before surgery and the  morning of Surgery.  2.  If you choose to wash your hair, wash your hair first as usual with your  normal  shampoo.  3.  After you shampoo, rinse your hair and body thoroughly to remove the  shampoo.  4.  Use CHG as you would any other liquid soap.  You can apply chg directly  to the skin and wash                       Gently with a scrungie or  clean washcloth.  5.  Apply the CHG Soap to your body ONLY FROM THE NECK DOWN.   Do not use on face/ open                           Wound or open sores. Avoid contact with eyes, ears mouth and genitals (private parts).                       Wash face,  Genitals (private parts) with your normal soap.             6.  Wash thoroughly, paying special attention to the area where your surgery  will be performed.  7.  Thoroughly rinse your body with warm water from the neck down.  8.  DO NOT shower/wash with your normal soap after using and rinsing off  the CHG Soap.                9.  Pat yourself dry with a clean towel.            10.  Wear clean pajamas.            11.  Place clean sheets on your bed the night of your first shower and do not  sleep with pets. Day of Surgery : Do not apply any lotions/deodorants the morning of surgery.  Please wear clean clothes to the hospital/surgery center.  FAILURE TO FOLLOW THESE INSTRUCTIONS MAY RESULT IN THE CANCELLATION OF YOUR SURGERY PATIENT SIGNATURE_________________________________  NURSE SIGNATURE__________________________________  ________________________________________________________________________   Adam Phenix  An incentive spirometer is a tool that can help keep your lungs clear and active. This tool measures how well you are filling your lungs with each breath. Taking long deep breaths may help reverse or decrease the chance of developing breathing (pulmonary) problems (especially infection) following:  A long period of time when you are unable to move or be active. BEFORE THE PROCEDURE   If the spirometer includes an indicator to show your best effort, your nurse or respiratory therapist will set it to a desired goal.  If possible, sit up straight or lean slightly forward. Try not to slouch.  Hold the incentive spirometer in an upright position. INSTRUCTIONS FOR USE  1. Sit on the edge of your bed if possible, or sit up as  far as you can in bed or on a chair. 2. Hold the incentive spirometer in an upright position. 3. Breathe out normally. 4. Place the mouthpiece in your mouth and seal your lips tightly around it. 5. Breathe in slowly and as deeply as possible, raising the piston or the ball toward the top of the column. 6. Hold your breath for 3-5 seconds or for as long as possible. Allow the piston or ball to fall to the bottom of the column. 7. Remove the mouthpiece from your mouth and breathe out normally. 8. Rest for a few seconds and repeat Steps 1 through 7 at least 10 times every 1-2 hours when you are awake. Take your time and take a few normal breaths between deep breaths. 9. The spirometer may include an indicator to  show your best effort. Use the indicator as a goal to work toward during each repetition. 10. After each set of 10 deep breaths, practice coughing to be sure your lungs are clear. If you have an incision (the cut made at the time of surgery), support your incision when coughing by placing a pillow or rolled up towels firmly against it. Once you are able to get out of bed, walk around indoors and cough well. You may stop using the incentive spirometer when instructed by your caregiver.  RISKS AND COMPLICATIONS  Take your time so you do not get dizzy or light-headed.  If you are in pain, you may need to take or ask for pain medication before doing incentive spirometry. It is harder to take a deep breath if you are having pain. AFTER USE  Rest and breathe slowly and easily.  It can be helpful to keep track of a log of your progress. Your caregiver can provide you with a simple table to help with this. If you are using the spirometer at home, follow these instructions: [place] IF:   You are having difficultly using the spirometer.  You have trouble using the spirometer as often as instructed.  Your pain medication is not giving enough relief while using the spirometer.  You  develop fever of 100.5 F (38.1 C) or higher. SEEK IMMEDIATE MEDICAL CARE IF:   You cough up bloody sputum that had not been present before.  You develop fever of 102 F (38.9 C) or greater.  You develop worsening pain at or near the incision site. MAKE SURE YOU:   Understand these instructions.  Will watch your condition.  Will get help right away if you are not doing well or get worse. Document Released: 04/07/2007 Document Revised: 02/17/2012 Document Reviewed: 06/08/2007 [facility] Patient Information 2014 [place], [place].   ________________________________________________________________________

## 2018-08-24 NOTE — Progress Notes (Signed)
07-16-18 Surgical clearance from Dr. Maudie Mercury on chart  07-15-18 (Epic) Cardiac Clearance from Daune Perch, NP  03-17-18 (Epic) EKG

## 2018-08-26 ENCOUNTER — Other Ambulatory Visit: Payer: Self-pay

## 2018-08-26 ENCOUNTER — Encounter (HOSPITAL_COMMUNITY): Payer: Self-pay

## 2018-08-26 ENCOUNTER — Encounter (HOSPITAL_COMMUNITY)
Admission: RE | Admit: 2018-08-26 | Discharge: 2018-08-26 | Disposition: A | Discharge status: Home / Self Care | Payer: Medicare Other | Source: Ambulatory Visit | Attending: Specialist | Admitting: Specialist

## 2018-08-26 DIAGNOSIS — I480 Paroxysmal atrial fibrillation: Secondary | ICD-10-CM | POA: Diagnosis not present

## 2018-08-26 DIAGNOSIS — Z01812 Encounter for preprocedural laboratory examination: Secondary | ICD-10-CM | POA: Insufficient documentation

## 2018-08-26 DIAGNOSIS — M419 Scoliosis, unspecified: Secondary | ICD-10-CM | POA: Diagnosis not present

## 2018-08-26 DIAGNOSIS — I10 Essential (primary) hypertension: Secondary | ICD-10-CM | POA: Diagnosis not present

## 2018-08-26 DIAGNOSIS — E7800 Pure hypercholesterolemia, unspecified: Secondary | ICD-10-CM | POA: Diagnosis not present

## 2018-08-26 DIAGNOSIS — M545 Low back pain: Secondary | ICD-10-CM | POA: Diagnosis not present

## 2018-08-26 DIAGNOSIS — E039 Hypothyroidism, unspecified: Secondary | ICD-10-CM | POA: Diagnosis not present

## 2018-08-26 LAB — URINALYSIS, ROUTINE W REFLEX MICROSCOPIC
BILIRUBIN URINE: NEGATIVE
Glucose, UA: NEGATIVE mg/dL
HGB URINE DIPSTICK: NEGATIVE
Ketones, ur: NEGATIVE mg/dL
Nitrite: NEGATIVE
PH: 6 (ref 5.0–8.0)
SPECIFIC GRAVITY, URINE: 1.025 (ref 1.005–1.030)

## 2018-08-26 LAB — SURGICAL PCR SCREEN
MRSA, PCR: NEGATIVE
Staphylococcus aureus: NEGATIVE

## 2018-08-26 LAB — BASIC METABOLIC PANEL
Anion gap: 10 (ref 5–15)
BUN: 14 mg/dL (ref 8–23)
CHLORIDE: 106 mmol/L (ref 98–111)
CO2: 28 mmol/L (ref 22–32)
Calcium: 9.5 mg/dL (ref 8.9–10.3)
Creatinine, Ser: 1.02 mg/dL — ABNORMAL HIGH (ref 0.44–1.00)
GFR calc Af Amer: 59 mL/min — ABNORMAL LOW (ref >60)
GFR calc non Af Amer: 51 mL/min — ABNORMAL LOW (ref >60)
Glucose, Bld: 105 mg/dL — ABNORMAL HIGH (ref 70–99)
Potassium: 4.5 mmol/L (ref 3.5–5.1)
SODIUM: 144 mmol/L (ref 135–145)

## 2018-08-26 LAB — URINALYSIS, MICROSCOPIC (REFLEX)

## 2018-08-26 LAB — CBC
HCT: 40.2 % (ref 36.0–46.0)
Hemoglobin: 13.2 g/dL (ref 12.0–15.0)
MCH: 30.3 pg (ref 26.0–34.0)
MCHC: 32.8 g/dL (ref 30.0–36.0)
MCV: 92.2 fL (ref 78.0–100.0)
Platelets: 312 10*3/uL (ref 150–400)
RBC: 4.36 MIL/uL (ref 3.87–5.11)
RDW: 14.1 % (ref 11.5–15.5)
WBC: 7.2 10*3/uL (ref 4.0–10.5)

## 2018-08-26 LAB — PROTIME-INR
INR: 1.15
Prothrombin Time: 14.6 seconds (ref 11.4–15.2)

## 2018-08-26 NOTE — Progress Notes (Signed)
08-26-18 UA result routed to Dr. Tonita Cong for review

## 2018-08-27 ENCOUNTER — Ambulatory Visit: Payer: Self-pay | Admitting: Orthopedic Surgery

## 2018-08-27 NOTE — H&P (View-Only) (Signed)
Amber Wu is an 77 y.o. female.   Chief Complaint: Right knee pain HPI: She reports chronic, progressively worsening right knee pain refractory to conservative treatment including Visco supplementation, cortisone injection, quad strengthening, activity modification, relative rest and medication. Pain is interfering with quality-of-life and activities of daily living at this point and she desires to proceed with total knee replacement.  Dr. Tonita Cong and the patient mutually agreed to proceed with a total knee replacement. Risks and benefits of the procedure were discussed including stiffness, suboptimal range of motion, persistent pain, infection requiring removal of prosthesis and reinsertion, need for prophylactic antibiotics in the future, for example, dental procedures, possible need for manipulation, revision in the future and also anesthetic complications including DVT, PE, etc. We discussed the perioperative course, time in the hospital, postoperative recovery and the need for elevation to control swelling. We also discussed the predicted range of motion and the probability that squatting and kneeling would be unobtainable in the future. In addition, postoperative anticoagulation was discussed. We have obtained preoperative medical clearance as necessary. Provided illustrated handout and discussed it in detail. They will enroll in the total joint replacement educational forum at the hospital.  Her pre-op appt was yesterday. She denies any UTI type symptoms. UA did show bacteria which is likely a contaminant.  The patient is here for her H & P. She is scheduled for a right total knee arthroplasty on 09/02/18 at [facility] by Dr. Tonita Cong.  Past Medical History:  Diagnosis Date  . Allergy   . Arthritis   . Atrial fibrillation ([place])    2D ECHO, 08/12/2012 - EF >55%, LA moderately dilated, RA moderately dilated, moderate tricuspid valve regurgitation  . Barrett's esophagus   . COPD (chronic  obstructive pulmonary disease) ([place])   . Depression   . Dysphonia   . Gastroparesis   . GERD (gastroesophageal reflux disease)   . Hiatal hernia   . History of esophageal stricture   . Hypertension   . Hypothyroidism   . Shortness of breath dyspnea     Past Surgical History:  Procedure Laterality Date  . ABDOMINAL HYSTERECTOMY  1970  . CARDIOVERSION  09/14/2012   Procedure: CARDIOVERSION;  Surgeon: Pixie Casino, MD;  Location: [facility] ENDOSCOPY;  Service: Cardiovascular;  Laterality: N/A;  . CATARACT EXTRACTION Bilateral 06/2014-08/2014  . COLONOSCOPY    . EYE SURGERY    . FACIAL COSMETIC SURGERY  2000  . KNEE SURGERY Right   . ORIF HUMERUS FRACTURE Right 07/06/2015   Procedure: OPEN REDUCTION INTERNAL FIXATION (ORIF) RIGHT PROXIMAL HUMERUS FRACTURE;  Surgeon: Justice Britain, MD;  Location: [facility];  Service: Orthopedics;  Laterality: Right;    Family History  Problem Relation Age of Onset  . Stroke Mother   . Heart disease Father   . Colon cancer Neg Hx    Social History:  reports that she quit smoking about 24 years ago. Her smoking use included cigarettes. She has a 7.50 pack-year smoking history. She has never used smokeless tobacco. She reports that she does not drink alcohol or use drugs.  Tobacco Smoking Status: Former smoker Most Recent Tobacco Use Screening: 05/08/2018 Alcohol intake: None Work related injury?: N Advance directive: Y  Allergies:  Allergies  Allergen Reactions  . Antihistamines, Chlorpheniramine-Type Other (See Comments)    INTENSE SHAKING AND UTI  . Antihistamines, Diphenhydramine-Type Other (See Comments)    INTENSE SHAKING AND UTI  . Antihistamines, Loratadine-Type Other (See Comments)    INTENSE SHAKING AND UTI  Medications: allopurinol 100 mg tablet amLODIPine 10 mg tablet citalopram 20 mg tablet Dexilant 60 mg capsule, delayed release famotidine 20 mg tablet furosemide 40 mg tablet Pradaxa 150 mg capsule rosuvastatin 10 mg  tablet Synthroid 150 mcg tablet Toprol XL 50 mg tablet,extended release  Results for orders placed or performed during the hospital encounter of 08/26/18 (from the past 48 hour(s))  Urinalysis, Routine w reflex microscopic     Status: Abnormal   Collection Time: 08/26/18  2:32 PM  Result Value Ref Range   Color, Urine YELLOW YELLOW   APPearance CLEAR CLEAR   Specific Gravity, Urine 1.025 1.005 - 1.030   pH 6.0 5.0 - 8.0   Glucose, UA NEGATIVE NEGATIVE mg/dL   Hgb urine dipstick NEGATIVE NEGATIVE   Bilirubin Urine NEGATIVE NEGATIVE   Ketones, ur NEGATIVE NEGATIVE mg/dL   Protein, ur TRACE (A) NEGATIVE mg/dL   Nitrite NEGATIVE NEGATIVE   Leukocytes, UA SMALL (A) NEGATIVE    Comment: Performed at [facility], [place] [address], [place], [place] [address]  Urinalysis, Microscopic (reflex)     Status: Abnormal   Collection Time: 08/26/18  2:32 PM  Result Value Ref Range   RBC / HPF 0-5 0 - 5 RBC/hpf   WBC, UA 0-5 0 - 5 WBC/hpf   Bacteria, UA MANY (A) NONE SEEN   Squamous Epithelial / LPF 0-5 0 - 5    Comment: Performed at [facility], [place] [address], [place], [place] [address]  Surgical pcr screen     Status: None   Collection Time: 08/26/18  2:50 PM  Result Value Ref Range   MRSA, PCR NEGATIVE NEGATIVE   Staphylococcus aureus NEGATIVE NEGATIVE    Comment: (NOTE) The Xpert SA Assay (FDA approved for NASAL specimens in patients 73 years of age and older), is one component of a comprehensive surveillance program. It is not intended to diagnose infection nor to guide or monitor treatment. Performed at [facility], [place] [address] [place], [place] [address]   Basic metabolic panel     Status: Abnormal   Collection Time: 08/26/18  4:15 PM  Result Value Ref Range   Sodium 144 135 - 145 mmol/L   Potassium 4.5 3.5 - 5.1 mmol/L   Chloride 106 98 - 111 mmol/L   CO2 28 22 - 32 mmol/L   Glucose, Bld 105 (H) 70 - 99 mg/dL   BUN  14 8 - 23 mg/dL   Creatinine, Ser 1.02 (H) 0.44 - 1.00 mg/dL   Calcium 9.5 8.9 - 10.3 mg/dL   GFR calc non Af Amer 51 (L) >60 mL/min   GFR calc Af Amer 59 (L) >60 mL/min    Comment: (NOTE) The eGFR has been calculated using the CKD EPI equation. This calculation has not been validated in all clinical situations. eGFR's persistently <60 mL/min signify possible Chronic Kidney Disease.    Anion gap 10 5 - 15    Comment: Performed at [facility], [place] [address] [place], [place] [address]  CBC     Status: None   Collection Time: 08/26/18  4:15 PM  Result Value Ref Range   WBC 7.2 4.0 - 10.5 K/uL   RBC 4.36 3.87 - 5.11 MIL/uL   Hemoglobin 13.2 12.0 - 15.0 g/dL   HCT 40.2 36.0 - 46.0 %   MCV 92.2 78.0 - 100.0 fL   MCH 30.3 26.0 - 34.0 pg   MCHC 32.8 30.0 - 36.0 g/dL  RDW 14.1 11.5 - 15.5 %   Platelets 312 150 - 400 K/uL    Comment: Performed at [facility], [place] [address], [place], [place] [address]  Protime-INR     Status: None   Collection Time: 08/26/18  4:15 PM  Result Value Ref Range   Prothrombin Time 14.6 11.4 - 15.2 seconds   INR 1.15     Comment: Performed at [facility], [place] [address], [place], [place] [address]   Review of Systems  Constitutional: Negative.   HENT: Negative.   Eyes: Negative.   Respiratory: Negative.   Cardiovascular: Negative.   Gastrointestinal: Negative.   Genitourinary: Negative.   Musculoskeletal: Positive for back pain and joint pain.  Skin: Negative.   Neurological: Negative.   Psychiatric/Behavioral: Negative.    There were no vitals taken for this visit. Physical Exam  Constitutional: She is oriented to person, place, and time. She appears well-developed.  HENT:  Head: Normocephalic.  Eyes: Pupils are equal, round, and reactive to light.  Neck: Normal range of motion.  Cardiovascular: Normal rate.  Respiratory: Effort normal.  GI: Soft.  Musculoskeletal:   Knee  Constitutional General Appearance: healthy-appearing, NAD  Gait and Station Appearance: antalgic gait  Cardiovascular System Arterial Pulses Right: femoral normal, popliteal normal, dorsalis pedis normal, posterior tibialis normal Edema Right: no edema Varicosities Right: no varicosities Varicosities Left: no varicosities, capillary refill test normal  Lymph Nodes Inspection/Palpation Right: no inguinal LAD  Knees Inspection Right: no deformity, swelling Bony Palpation Right: no tenderness of the inferior pole patella, no tenderness of the superior pole patella, no tenderness of the tibial tubercle, no tenderness of the medial tibial plateau, no tenderness of Gerdy's tubercle, no tenderness of the neck of fibula, tenderness of the lateral patellar facet, tenderness of the medial femoral condyle, tenderness of the lateral joint line, tenderness of the medial joint line Soft Tissue Palpation Right: no tenderness of the quadriceps tendon, no tenderness of the prepatellar bursa, no tenderness of the patellar tendon, no tenderness of the medial collateral ligament, no tenderness of the saphenous nerve, no tenderness of the lateral collateral ligament, no tenderness of the infrapatellar tendon, no tenderness of the common peroneal nerve Active Range of Motion Right: limited Passive Range of Motion RIght: limited Stability Right: no laxity, no ligamentous instability, anterior drawer sign negative, posterior drawer sign negative, Lachman test negative Special Tests Right: McMurray's test negative Strength Right: flexion 5/5, extension 5/5, no hamstring weakness, no quadriceps weakness  Skin Right Lower Extremity: normal  Neurologic Ankle Reflex Right: normal (2) Knee Reflex Right: normal (2) Sensation on the Right: L2 normal, L3 normal, L4 normal, L5 normal, S1 normal  Psychiatric Mood and Affect: active and alert, normal mood Patient is in moderate to moderate distress walks an  antalgic gait.  Neurological: She is alert and oriented to person, place, and time.  Skin: Skin is warm and dry.    X-rays reviewed with end-stage degenerative changes right knee medial and patellofemoral compartments.  Assessment/Plan Impression: End-stage right knee osteoarthritis  Plan: Pt with end-stage right knee DJD, bone-on-bone, refractory to conservative tx, scheduled for right total knee replacement by Dr. Tonita Cong on 09/02/18. We again discussed the procedure itself as well as risks, complications and alternatives, including but not limited to DVT, PE, infx, bleeding, failure of procedure, need for secondary procedure including manipulation, nerve injury, ongoing pain/symptoms, anesthesia risk, even stroke or death. Also discussed typical post-op protocols, activity restrictions, need for PT, flexion/extension exercises,  time out of work. Discussed need for DVT ppx post-op per protocol. Discussed dental ppx and infx prevention. Also discussed limitations post-operatively such as kneeling and squatting. All questions were answered. Patient desires to proceed with surgery as scheduled.  Will hold supplements, ASA and NSAIDs accordingly. Will remain NPO after midnight the night before surgery. Anticipate hospital stay to include at least 2 midnights given medical history and to ensure proper pain control. Plan to resume Pradaxa for DVT ppx post-op, will discuss with [facility] pharmacist Louie Casa for recommendations. Plan Percocet, Colace, Miralax. Plan virtual PT at patient's request. She does also have a stationary bike at home. Will follow up 10-14 days post-op for suture removal and xrays.  Anticipated LOS equal to or greater than 2 midnights due to - Age 26 and older with one or more of the following:  - Obesity  - Expected need for hospital services (PT, OT, Nursing) required for safe  discharge  - Anticipated need for postoperative skilled nursing care or inpatient rehab  - Active co-morbidities:  Cardiac Arrhythmia and Thyroid disease  Plan Right total knee replacement  Cecilie Kicks., PA-C for Dr. Tonita Cong 08/27/2018, 4:30 PM

## 2018-08-27 NOTE — H&P (Signed)
Amber Wu is an 77 y.o. female.   Chief Complaint: Right knee pain HPI: She reports chronic, progressively worsening right knee pain refractory to conservative treatment including Visco supplementation, cortisone injection, quad strengthening, activity modification, relative rest and medication. Pain is interfering with quality-of-life and activities of daily living at this point and she desires to proceed with total knee replacement.  Dr. Tonita Cong and the patient mutually agreed to proceed with a total knee replacement. Risks and benefits of the procedure were discussed including stiffness, suboptimal range of motion, persistent pain, infection requiring removal of prosthesis and reinsertion, need for prophylactic antibiotics in the future, for example, dental procedures, possible need for manipulation, revision in the future and also anesthetic complications including DVT, PE, etc. We discussed the perioperative course, time in the hospital, postoperative recovery and the need for elevation to control swelling. We also discussed the predicted range of motion and the probability that squatting and kneeling would be unobtainable in the future. In addition, postoperative anticoagulation was discussed. We have obtained preoperative medical clearance as necessary. Provided illustrated handout and discussed it in detail. They will enroll in the total joint replacement educational forum at the hospital.  Her pre-op appt was yesterday. She denies any UTI type symptoms. UA did show bacteria which is likely a contaminant.  The patient is here for her H & P. She is scheduled for a right total knee arthroplasty on 09/02/18 at [facility] by Dr. Tonita Cong.  Past Medical History:  Diagnosis Date  . Allergy   . Arthritis   . Atrial fibrillation ([place])    2D ECHO, 08/12/2012 - EF >55%, LA moderately dilated, RA moderately dilated, moderate tricuspid valve regurgitation  . Barrett's esophagus   . COPD (chronic  obstructive pulmonary disease) ([place])   . Depression   . Dysphonia   . Gastroparesis   . GERD (gastroesophageal reflux disease)   . Hiatal hernia   . History of esophageal stricture   . Hypertension   . Hypothyroidism   . Shortness of breath dyspnea     Past Surgical History:  Procedure Laterality Date  . ABDOMINAL HYSTERECTOMY  1970  . CARDIOVERSION  09/14/2012   Procedure: CARDIOVERSION;  Surgeon: Pixie Casino, MD;  Location: [facility] ENDOSCOPY;  Service: Cardiovascular;  Laterality: N/A;  . CATARACT EXTRACTION Bilateral 06/2014-08/2014  . COLONOSCOPY    . EYE SURGERY    . FACIAL COSMETIC SURGERY  2000  . KNEE SURGERY Right   . ORIF HUMERUS FRACTURE Right 07/06/2015   Procedure: OPEN REDUCTION INTERNAL FIXATION (ORIF) RIGHT PROXIMAL HUMERUS FRACTURE;  Surgeon: Justice Britain, MD;  Location: [facility];  Service: Orthopedics;  Laterality: Right;    Family History  Problem Relation Age of Onset  . Stroke Mother   . Heart disease Father   . Colon cancer Neg Hx    Social History:  reports that she quit smoking about 24 years ago. Her smoking use included cigarettes. She has a 7.50 pack-year smoking history. She has never used smokeless tobacco. She reports that she does not drink alcohol or use drugs.  Tobacco Smoking Status: Former smoker Most Recent Tobacco Use Screening: 05/08/2018 Alcohol intake: None Work related injury?: N Advance directive: Y  Allergies:  Allergies  Allergen Reactions  . Antihistamines, Chlorpheniramine-Type Other (See Comments)    INTENSE SHAKING AND UTI  . Antihistamines, Diphenhydramine-Type Other (See Comments)    INTENSE SHAKING AND UTI  . Antihistamines, Loratadine-Type Other (See Comments)    INTENSE SHAKING AND UTI  Medications: allopurinol 100 mg tablet amLODIPine 10 mg tablet citalopram 20 mg tablet Dexilant 60 mg capsule, delayed release famotidine 20 mg tablet furosemide 40 mg tablet Pradaxa 150 mg capsule rosuvastatin 10 mg  tablet Synthroid 150 mcg tablet Toprol XL 50 mg tablet,extended release  Results for orders placed or performed during the hospital encounter of 08/26/18 (from the past 48 hour(s))  Urinalysis, Routine w reflex microscopic     Status: Abnormal   Collection Time: 08/26/18  2:32 PM  Result Value Ref Range   Color, Urine YELLOW YELLOW   APPearance CLEAR CLEAR   Specific Gravity, Urine 1.025 1.005 - 1.030   pH 6.0 5.0 - 8.0   Glucose, UA NEGATIVE NEGATIVE mg/dL   Hgb urine dipstick NEGATIVE NEGATIVE   Bilirubin Urine NEGATIVE NEGATIVE   Ketones, ur NEGATIVE NEGATIVE mg/dL   Protein, ur TRACE (A) NEGATIVE mg/dL   Nitrite NEGATIVE NEGATIVE   Leukocytes, UA SMALL (A) NEGATIVE    Comment: Performed at [facility], [place] [address], [place], [place] [address]  Urinalysis, Microscopic (reflex)     Status: Abnormal   Collection Time: 08/26/18  2:32 PM  Result Value Ref Range   RBC / HPF 0-5 0 - 5 RBC/hpf   WBC, UA 0-5 0 - 5 WBC/hpf   Bacteria, UA MANY (A) NONE SEEN   Squamous Epithelial / LPF 0-5 0 - 5    Comment: Performed at [facility], [place] [address], [place], [place] [address]  Surgical pcr screen     Status: None   Collection Time: 08/26/18  2:50 PM  Result Value Ref Range   MRSA, PCR NEGATIVE NEGATIVE   Staphylococcus aureus NEGATIVE NEGATIVE    Comment: (NOTE) The Xpert SA Assay (FDA approved for NASAL specimens in patients 24 years of age and older), is one component of a comprehensive surveillance program. It is not intended to diagnose infection nor to guide or monitor treatment. Performed at [facility], [place] [address] [place], [place] [address]   Basic metabolic panel     Status: Abnormal   Collection Time: 08/26/18  4:15 PM  Result Value Ref Range   Sodium 144 135 - 145 mmol/L   Potassium 4.5 3.5 - 5.1 mmol/L   Chloride 106 98 - 111 mmol/L   CO2 28 22 - 32 mmol/L   Glucose, Bld 105 (H) 70 - 99 mg/dL   BUN  14 8 - 23 mg/dL   Creatinine, Ser 1.02 (H) 0.44 - 1.00 mg/dL   Calcium 9.5 8.9 - 10.3 mg/dL   GFR calc non Af Amer 51 (L) >60 mL/min   GFR calc Af Amer 59 (L) >60 mL/min    Comment: (NOTE) The eGFR has been calculated using the CKD EPI equation. This calculation has not been validated in all clinical situations. eGFR's persistently <60 mL/min signify possible Chronic Kidney Disease.    Anion gap 10 5 - 15    Comment: Performed at [facility], [place] [address], [place], [place] [address]  CBC     Status: None   Collection Time: 08/26/18  4:15 PM  Result Value Ref Range   WBC 7.2 4.0 - 10.5 K/uL   RBC 4.36 3.87 - 5.11 MIL/uL   Hemoglobin 13.2 12.0 - 15.0 g/dL   HCT 40.2 36.0 - 46.0 %   MCV 92.2 78.0 - 100.0 fL   MCH 30.3 26.0 - 34.0 pg   MCHC 32.8 30.0 - 36.0 g/dL  RDW 14.1 11.5 - 15.5 %   Platelets 312 150 - 400 K/uL    Comment: Performed at [facility], [place] [address], [place], [place] [address]  Protime-INR     Status: None   Collection Time: 08/26/18  4:15 PM  Result Value Ref Range   Prothrombin Time 14.6 11.4 - 15.2 seconds   INR 1.15     Comment: Performed at [facility], [place] [address] [place], [place] [address]   Review of Systems  Constitutional: Negative.   HENT: Negative.   Eyes: Negative.   Respiratory: Negative.   Cardiovascular: Negative.   Gastrointestinal: Negative.   Genitourinary: Negative.   Musculoskeletal: Positive for back pain and joint pain.  Skin: Negative.   Neurological: Negative.   Psychiatric/Behavioral: Negative.    There were no vitals taken for this visit. Physical Exam  Constitutional: She is oriented to person, place, and time. She appears well-developed.  HENT:  Head: Normocephalic.  Eyes: Pupils are equal, round, and reactive to light.  Neck: Normal range of motion.  Cardiovascular: Normal rate.  Respiratory: Effort normal.  GI: Soft.  Musculoskeletal:   Knee  Constitutional General Appearance: healthy-appearing, NAD  Gait and Station Appearance: antalgic gait  Cardiovascular System Arterial Pulses Right: femoral normal, popliteal normal, dorsalis pedis normal, posterior tibialis normal Edema Right: no edema Varicosities Right: no varicosities Varicosities Left: no varicosities, capillary refill test normal  Lymph Nodes Inspection/Palpation Right: no inguinal LAD  Knees Inspection Right: no deformity, swelling Bony Palpation Right: no tenderness of the inferior pole patella, no tenderness of the superior pole patella, no tenderness of the tibial tubercle, no tenderness of the medial tibial plateau, no tenderness of Gerdy's tubercle, no tenderness of the neck of fibula, tenderness of the lateral patellar facet, tenderness of the medial femoral condyle, tenderness of the lateral joint line, tenderness of the medial joint line Soft Tissue Palpation Right: no tenderness of the quadriceps tendon, no tenderness of the prepatellar bursa, no tenderness of the patellar tendon, no tenderness of the medial collateral ligament, no tenderness of the saphenous nerve, no tenderness of the lateral collateral ligament, no tenderness of the infrapatellar tendon, no tenderness of the common peroneal nerve Active Range of Motion Right: limited Passive Range of Motion RIght: limited Stability Right: no laxity, no ligamentous instability, anterior drawer sign negative, posterior drawer sign negative, Lachman test negative Special Tests Right: McMurray's test negative Strength Right: flexion 5/5, extension 5/5, no hamstring weakness, no quadriceps weakness  Skin Right Lower Extremity: normal  Neurologic Ankle Reflex Right: normal (2) Knee Reflex Right: normal (2) Sensation on the Right: L2 normal, L3 normal, L4 normal, L5 normal, S1 normal  Psychiatric Mood and Affect: active and alert, normal mood Patient is in moderate to moderate distress walks an  antalgic gait.  Neurological: She is alert and oriented to person, place, and time.  Skin: Skin is warm and dry.    X-rays reviewed with end-stage degenerative changes right knee medial and patellofemoral compartments.  Assessment/Plan Impression: End-stage right knee osteoarthritis  Plan: Pt with end-stage right knee DJD, bone-on-bone, refractory to conservative tx, scheduled for right total knee replacement by Dr. Tonita Cong on 09/02/18. We again discussed the procedure itself as well as risks, complications and alternatives, including but not limited to DVT, PE, infx, bleeding, failure of procedure, need for secondary procedure including manipulation, nerve injury, ongoing pain/symptoms, anesthesia risk, even stroke or death. Also discussed typical post-op protocols, activity restrictions, need for PT, flexion/extension exercises,  time out of work. Discussed need for DVT ppx post-op per protocol. Discussed dental ppx and infx prevention. Also discussed limitations post-operatively such as kneeling and squatting. All questions were answered. Patient desires to proceed with surgery as scheduled.  Will hold supplements, ASA and NSAIDs accordingly. Will remain NPO after midnight the night before surgery. Anticipate hospital stay to include at least 2 midnights given medical history and to ensure proper pain control. Plan to resume Pradaxa for DVT ppx post-op, will discuss with [facility] pharmacist Louie Casa for recommendations. Plan Percocet, Colace, Miralax. Plan virtual PT at patient's request. She does also have a stationary bike at home. Will follow up 10-14 days post-op for suture removal and xrays.  Anticipated LOS equal to or greater than 2 midnights due to - Age 55 and older with one or more of the following:  - Obesity  - Expected need for hospital services (PT, OT, Nursing) required for safe  discharge  - Anticipated need for postoperative skilled nursing care or inpatient rehab  - Active co-morbidities:  Cardiac Arrhythmia and Thyroid disease  Plan Right total knee replacement  Cecilie Kicks., PA-C for Dr. Tonita Cong 08/27/2018, 4:30 PM

## 2018-09-02 ENCOUNTER — Encounter (HOSPITAL_COMMUNITY): Payer: Self-pay | Admitting: General Practice

## 2018-09-02 ENCOUNTER — Inpatient Hospital Stay (HOSPITAL_COMMUNITY): Payer: Medicare Other | Admitting: Certified Registered"

## 2018-09-02 ENCOUNTER — Other Ambulatory Visit: Payer: Self-pay | Admitting: Specialist

## 2018-09-02 ENCOUNTER — Encounter (HOSPITAL_COMMUNITY)
Admission: RE | Disposition: A | Discharge status: Home / Self Care | Payer: Self-pay | Source: Ambulatory Visit | Attending: Specialist

## 2018-09-02 ENCOUNTER — Inpatient Hospital Stay (HOSPITAL_COMMUNITY)
Admission: RE | Admit: 2018-09-02 | Discharge: 2018-09-05 | DRG: 470 | Disposition: A | Discharge status: Home / Self Care | Payer: Medicare Other | Source: Ambulatory Visit | Attending: Specialist | Admitting: Specialist

## 2018-09-02 ENCOUNTER — Other Ambulatory Visit: Payer: Self-pay

## 2018-09-02 ENCOUNTER — Inpatient Hospital Stay (HOSPITAL_COMMUNITY): Payer: Medicare Other

## 2018-09-02 DIAGNOSIS — G8918 Other acute postprocedural pain: Secondary | ICD-10-CM | POA: Diagnosis not present

## 2018-09-02 DIAGNOSIS — Z87891 Personal history of nicotine dependence: Secondary | ICD-10-CM | POA: Diagnosis not present

## 2018-09-02 DIAGNOSIS — E039 Hypothyroidism, unspecified: Secondary | ICD-10-CM | POA: Diagnosis present

## 2018-09-02 DIAGNOSIS — J449 Chronic obstructive pulmonary disease, unspecified: Secondary | ICD-10-CM | POA: Diagnosis not present

## 2018-09-02 DIAGNOSIS — M1711 Unilateral primary osteoarthritis, right knee: Secondary | ICD-10-CM | POA: Diagnosis not present

## 2018-09-02 DIAGNOSIS — Z888 Allergy status to other drugs, medicaments and biological substances status: Secondary | ICD-10-CM

## 2018-09-02 DIAGNOSIS — I4891 Unspecified atrial fibrillation: Secondary | ICD-10-CM | POA: Diagnosis not present

## 2018-09-02 DIAGNOSIS — Z471 Aftercare following joint replacement surgery: Secondary | ICD-10-CM | POA: Diagnosis not present

## 2018-09-02 DIAGNOSIS — Z96659 Presence of unspecified artificial knee joint: Secondary | ICD-10-CM

## 2018-09-02 DIAGNOSIS — M21161 Varus deformity, not elsewhere classified, right knee: Secondary | ICD-10-CM | POA: Diagnosis present

## 2018-09-02 DIAGNOSIS — Z96651 Presence of right artificial knee joint: Secondary | ICD-10-CM | POA: Diagnosis not present

## 2018-09-02 DIAGNOSIS — M810 Age-related osteoporosis without current pathological fracture: Secondary | ICD-10-CM | POA: Diagnosis present

## 2018-09-02 HISTORY — PX: TOTAL KNEE ARTHROPLASTY: SHX125

## 2018-09-02 LAB — APTT: aPTT: 29 seconds (ref 24–36)

## 2018-09-02 SURGERY — ARTHROPLASTY, KNEE, TOTAL
Anesthesia: Monitor Anesthesia Care | Site: Knee | Laterality: Right

## 2018-09-02 MED ORDER — ALLOPURINOL 100 MG PO TABS
200.0000 mg | ORAL_TABLET | Freq: Every day | ORAL | Status: DC
  Administered 2018-09-03 – 2018-09-05 (×3): 200 mg via ORAL
  Filled 2018-09-02 (×3): qty 2

## 2018-09-02 MED ORDER — ROPIVACAINE HCL 7.5 MG/ML IJ SOLN
INTRAMUSCULAR | Status: DC | PRN
  Administered 2018-09-02: 20 mL via PERINEURAL

## 2018-09-02 MED ORDER — SODIUM CHLORIDE 0.9 % IV SOLN
INTRAVENOUS | Status: DC | PRN
  Administered 2018-09-02: 20 ug/min via INTRAVENOUS

## 2018-09-02 MED ORDER — BUPIVACAINE-EPINEPHRINE (PF) 0.5% -1:200000 IJ SOLN
INTRAMUSCULAR | Status: DC | PRN
  Administered 2018-09-02: 30 mL

## 2018-09-02 MED ORDER — NAPHAZOLINE-PHENIRAMINE 0.025-0.3 % OP SOLN: 1.0000 [drp] | Freq: Four times a day (QID) | OPHTHALMIC | Status: DC | PRN

## 2018-09-02 MED ORDER — RISAQUAD PO CAPS
1.0000 | ORAL_CAPSULE | Freq: Every day | ORAL | Status: DC
  Administered 2018-09-02 – 2018-09-05 (×4): 1 via ORAL
  Filled 2018-09-02 (×4): qty 1

## 2018-09-02 MED ORDER — POLYETHYLENE GLYCOL 3350 17 G PO PACK: 17.0000 g | PACK | Freq: Every day | ORAL | 0 refills | Status: AC

## 2018-09-02 MED ORDER — ONDANSETRON HCL 4 MG/2ML IJ SOLN
INTRAMUSCULAR | Status: AC
  Filled 2018-09-02: qty 2

## 2018-09-02 MED ORDER — MENTHOL 3 MG MT LOZG: 1.0000 | LOZENGE | OROMUCOSAL | Status: DC | PRN

## 2018-09-02 MED ORDER — FENTANYL CITRATE (PF) 100 MCG/2ML IJ SOLN
50.0000 ug | INTRAMUSCULAR | Status: DC | PRN
  Administered 2018-09-02: 50 ug via INTRAVENOUS
  Filled 2018-09-02: qty 2

## 2018-09-02 MED ORDER — BUPIVACAINE-EPINEPHRINE (PF) 0.5% -1:200000 IJ SOLN
INTRAMUSCULAR | Status: AC
  Filled 2018-09-02: qty 30

## 2018-09-02 MED ORDER — KCL IN DEXTROSE-NACL 20-5-0.45 MEQ/L-%-% IV SOLN
INTRAVENOUS | Status: AC
  Administered 2018-09-02: 18:00:00 via INTRAVENOUS
  Filled 2018-09-02 (×2): qty 1000

## 2018-09-02 MED ORDER — PHENYLEPHRINE 40 MCG/ML (10ML) SYRINGE FOR IV PUSH (FOR BLOOD PRESSURE SUPPORT)
PREFILLED_SYRINGE | INTRAVENOUS | Status: DC | PRN
  Administered 2018-09-02 (×2): 40 ug via INTRAVENOUS
  Administered 2018-09-02: 80 ug via INTRAVENOUS
  Administered 2018-09-02 (×3): 40 ug via INTRAVENOUS

## 2018-09-02 MED ORDER — SODIUM CHLORIDE 0.9 % IV SOLN
INTRAVENOUS | Status: DC | PRN
  Administered 2018-09-02: 500 mL

## 2018-09-02 MED ORDER — DEXAMETHASONE SODIUM PHOSPHATE 10 MG/ML IJ SOLN
INTRAMUSCULAR | Status: AC
  Filled 2018-09-02: qty 1

## 2018-09-02 MED ORDER — METHOCARBAMOL 500 MG IVPB - SIMPLE MED
500.0000 mg | Freq: Four times a day (QID) | INTRAVENOUS | Status: DC | PRN
  Filled 2018-09-02: qty 50

## 2018-09-02 MED ORDER — HYDROMORPHONE HCL 1 MG/ML IJ SOLN: 0.5000 mg | INTRAMUSCULAR | Status: DC | PRN

## 2018-09-02 MED ORDER — OXYCODONE HCL 5 MG PO TABS
10.0000 mg | ORAL_TABLET | ORAL | Status: DC | PRN
  Administered 2018-09-03: 15 mg via ORAL
  Administered 2018-09-04 (×2): 10 mg via ORAL
  Filled 2018-09-02 (×4): qty 2
  Filled 2018-09-02: qty 3

## 2018-09-02 MED ORDER — DABIGATRAN ETEXILATE MESYLATE 150 MG PO CAPS
150.0000 mg | ORAL_CAPSULE | Freq: Two times a day (BID) | ORAL | Status: DC
  Administered 2018-09-03 – 2018-09-05 (×5): 150 mg via ORAL
  Filled 2018-09-02 (×5): qty 1

## 2018-09-02 MED ORDER — FENTANYL CITRATE (PF) 100 MCG/2ML IJ SOLN: 25.0000 ug | INTRAMUSCULAR | Status: DC | PRN

## 2018-09-02 MED ORDER — DOCUSATE SODIUM 100 MG PO CAPS: 100.0000 mg | ORAL_CAPSULE | Freq: Two times a day (BID) | ORAL | 2 refills | Status: AC

## 2018-09-02 MED ORDER — SODIUM CHLORIDE 0.9 % IR SOLN
Status: DC | PRN
  Administered 2018-09-02: 3000 mL

## 2018-09-02 MED ORDER — MAGNESIUM CITRATE PO SOLN: 1.0000 | Freq: Once | ORAL | Status: DC | PRN

## 2018-09-02 MED ORDER — MIDAZOLAM HCL 2 MG/2ML IJ SOLN
1.0000 mg | INTRAMUSCULAR | Status: DC | PRN
  Filled 2018-09-02: qty 2

## 2018-09-02 MED ORDER — OXYCODONE HCL 5 MG PO TABS
5.0000 mg | ORAL_TABLET | ORAL | Status: DC | PRN
  Administered 2018-09-02 – 2018-09-05 (×8): 10 mg via ORAL
  Filled 2018-09-02 (×3): qty 2
  Filled 2018-09-02 (×2): qty 1
  Filled 2018-09-02 (×3): qty 2

## 2018-09-02 MED ORDER — PROPOFOL 10 MG/ML IV BOLUS
INTRAVENOUS | Status: AC
  Filled 2018-09-02: qty 40

## 2018-09-02 MED ORDER — DOCUSATE SODIUM 100 MG PO CAPS
100.0000 mg | ORAL_CAPSULE | Freq: Two times a day (BID) | ORAL | Status: DC
  Administered 2018-09-02 – 2018-09-05 (×4): 100 mg via ORAL
  Filled 2018-09-02 (×6): qty 1

## 2018-09-02 MED ORDER — TRANEXAMIC ACID 1000 MG/10ML IV SOLN
1000.0000 mg | INTRAVENOUS | Status: AC
  Administered 2018-09-02: 1000 mg via INTRAVENOUS
  Filled 2018-09-02: qty 10

## 2018-09-02 MED ORDER — ACETAMINOPHEN 500 MG PO TABS
1000.0000 mg | ORAL_TABLET | Freq: Four times a day (QID) | ORAL | Status: AC
  Administered 2018-09-02 – 2018-09-03 (×3): 1000 mg via ORAL
  Filled 2018-09-02 (×4): qty 2

## 2018-09-02 MED ORDER — ALUM & MAG HYDROXIDE-SIMETH 200-200-20 MG/5ML PO SUSP: 30.0000 mL | ORAL | Status: DC | PRN

## 2018-09-02 MED ORDER — PHENOL 1.4 % MT LIQD: 1.0000 | OROMUCOSAL | Status: DC | PRN

## 2018-09-02 MED ORDER — PROPOFOL 500 MG/50ML IV EMUL
INTRAVENOUS | Status: DC | PRN
  Administered 2018-09-02: 50 ug/kg/min via INTRAVENOUS

## 2018-09-02 MED ORDER — PANTOPRAZOLE SODIUM 40 MG PO TBEC
40.0000 mg | DELAYED_RELEASE_TABLET | Freq: Every day | ORAL | Status: DC
  Administered 2018-09-02 – 2018-09-05 (×4): 40 mg via ORAL
  Filled 2018-09-02 (×3): qty 1

## 2018-09-02 MED ORDER — CEFAZOLIN SODIUM-DEXTROSE 2-4 GM/100ML-% IV SOLN
2.0000 g | INTRAVENOUS | Status: AC
  Administered 2018-09-02: 2 g via INTRAVENOUS
  Filled 2018-09-02: qty 100

## 2018-09-02 MED ORDER — CLONIDINE HCL (ANALGESIA) 100 MCG/ML EP SOLN
EPIDURAL | Status: DC | PRN
  Administered 2018-09-02: 50 ug

## 2018-09-02 MED ORDER — SODIUM CHLORIDE 0.9 % IV SOLN
INTRAVENOUS | Status: AC
  Filled 2018-09-02: qty 500000

## 2018-09-02 MED ORDER — POLYETHYLENE GLYCOL 3350 17 G PO PACK: 17.0000 g | PACK | Freq: Every day | ORAL | Status: DC | PRN

## 2018-09-02 MED ORDER — BISACODYL 5 MG PO TBEC: 5.0000 mg | DELAYED_RELEASE_TABLET | Freq: Every day | ORAL | Status: DC | PRN

## 2018-09-02 MED ORDER — CITALOPRAM HYDROBROMIDE 20 MG PO TABS
20.0000 mg | ORAL_TABLET | Freq: Every day | ORAL | Status: DC
  Administered 2018-09-03 – 2018-09-05 (×3): 20 mg via ORAL
  Filled 2018-09-02 (×3): qty 1

## 2018-09-02 MED ORDER — ONDANSETRON HCL 4 MG PO TABS: 4.0000 mg | ORAL_TABLET | Freq: Four times a day (QID) | ORAL | Status: DC | PRN

## 2018-09-02 MED ORDER — OXYCODONE HCL 5 MG PO TABS: 5.0000 mg | ORAL_TABLET | ORAL | 0 refills | Status: AC | PRN

## 2018-09-02 MED ORDER — METOCLOPRAMIDE HCL 5 MG/ML IJ SOLN: 5.0000 mg | Freq: Three times a day (TID) | INTRAMUSCULAR | Status: DC | PRN

## 2018-09-02 MED ORDER — ONDANSETRON HCL 4 MG/2ML IJ SOLN
INTRAMUSCULAR | Status: DC | PRN
  Administered 2018-09-02: 4 mg via INTRAVENOUS

## 2018-09-02 MED ORDER — METOPROLOL SUCCINATE ER 50 MG PO TB24
50.0000 mg | ORAL_TABLET | Freq: Two times a day (BID) | ORAL | Status: DC
  Administered 2018-09-02 – 2018-09-05 (×6): 50 mg via ORAL
  Filled 2018-09-02 (×6): qty 1

## 2018-09-02 MED ORDER — BUPIVACAINE IN DEXTROSE 0.75-8.25 % IT SOLN
INTRATHECAL | Status: DC | PRN
  Administered 2018-09-02: 2 mL via INTRATHECAL

## 2018-09-02 MED ORDER — FUROSEMIDE 40 MG PO TABS
40.0000 mg | ORAL_TABLET | ORAL | Status: DC
  Administered 2018-09-03 – 2018-09-05 (×2): 40 mg via ORAL
  Filled 2018-09-02 (×2): qty 1

## 2018-09-02 MED ORDER — PHENYLEPHRINE HCL 10 MG/ML IJ SOLN
INTRAMUSCULAR | Status: AC
  Filled 2018-09-02: qty 2

## 2018-09-02 MED ORDER — ACETAMINOPHEN 10 MG/ML IV SOLN
1000.0000 mg | INTRAVENOUS | Status: AC
  Administered 2018-09-02: 1000 mg via INTRAVENOUS
  Filled 2018-09-02: qty 100

## 2018-09-02 MED ORDER — ONDANSETRON HCL 4 MG/2ML IJ SOLN: 4.0000 mg | Freq: Four times a day (QID) | INTRAMUSCULAR | Status: DC | PRN

## 2018-09-02 MED ORDER — EPHEDRINE SULFATE-NACL 50-0.9 MG/10ML-% IV SOSY
PREFILLED_SYRINGE | INTRAVENOUS | Status: DC | PRN
  Administered 2018-09-02 (×2): 5 mg via INTRAVENOUS

## 2018-09-02 MED ORDER — AMLODIPINE BESYLATE 10 MG PO TABS
10.0000 mg | ORAL_TABLET | Freq: Every evening | ORAL | Status: DC
  Administered 2018-09-03 – 2018-09-04 (×2): 10 mg via ORAL
  Filled 2018-09-02 (×2): qty 1

## 2018-09-02 MED ORDER — DEXAMETHASONE SODIUM PHOSPHATE 10 MG/ML IJ SOLN
INTRAMUSCULAR | Status: DC | PRN
  Administered 2018-09-02: 10 mg via INTRAVENOUS

## 2018-09-02 MED ORDER — LACTATED RINGERS IV SOLN
INTRAVENOUS | Status: DC
  Administered 2018-09-02: 15:00:00 via INTRAVENOUS
  Administered 2018-09-02: 1000 mL via INTRAVENOUS

## 2018-09-02 MED ORDER — ACETAMINOPHEN 325 MG PO TABS: 325.0000 mg | ORAL_TABLET | Freq: Four times a day (QID) | ORAL | Status: DC | PRN

## 2018-09-02 MED ORDER — PROPOFOL 10 MG/ML IV BOLUS
INTRAVENOUS | Status: AC
  Filled 2018-09-02: qty 20

## 2018-09-02 MED ORDER — PSYLLIUM 95 % PO PACK
1.0000 | PACK | Freq: Every day | ORAL | Status: DC
  Administered 2018-09-03 – 2018-09-04 (×2): 1 via ORAL
  Filled 2018-09-02 (×4): qty 1

## 2018-09-02 MED ORDER — METHOCARBAMOL 500 MG PO TABS
500.0000 mg | ORAL_TABLET | Freq: Four times a day (QID) | ORAL | Status: DC | PRN
  Administered 2018-09-02 – 2018-09-05 (×6): 500 mg via ORAL
  Filled 2018-09-02 (×6): qty 1

## 2018-09-02 MED ORDER — LEVOTHYROXINE SODIUM 75 MCG PO TABS
150.0000 ug | ORAL_TABLET | Freq: Every day | ORAL | Status: DC
  Administered 2018-09-03 – 2018-09-05 (×3): 150 ug via ORAL
  Filled 2018-09-02 (×4): qty 2

## 2018-09-02 MED ORDER — STERILE WATER FOR IRRIGATION IR SOLN
Status: DC | PRN
  Administered 2018-09-02: 2000 mL

## 2018-09-02 MED ORDER — METOCLOPRAMIDE HCL 5 MG PO TABS: 5.0000 mg | ORAL_TABLET | Freq: Three times a day (TID) | ORAL | Status: DC | PRN

## 2018-09-02 MED ORDER — CEFAZOLIN SODIUM-DEXTROSE 2-4 GM/100ML-% IV SOLN
2.0000 g | Freq: Four times a day (QID) | INTRAVENOUS | Status: AC
  Administered 2018-09-02 – 2018-09-03 (×3): 2 g via INTRAVENOUS
  Filled 2018-09-02 (×3): qty 100

## 2018-09-02 SURGICAL SUPPLY — 84 items
AGENT HMST SPONGE THK3/8 (HEMOSTASIS)
ATTUNE MED DOME PAT 38 KNEE (Knees) ×1 IMPLANT
ATTUNE PS FEM RT SZ 5 CEM KNEE (Femur) ×1 IMPLANT
ATTUNE PSRP INSR SZ5 5 KNEE (Insert) ×1 IMPLANT
BAG DECANTER FOR FLEXI CONT (MISCELLANEOUS) ×1 IMPLANT
BAG SPEC THK2 15X12 ZIP CLS (MISCELLANEOUS)
BAG ZIPLOCK 12X15 (MISCELLANEOUS) IMPLANT
BANDAGE ACE 4X5 VEL STRL LF (GAUZE/BANDAGES/DRESSINGS) ×2 IMPLANT
BANDAGE ACE 6X5 VEL STRL LF (GAUZE/BANDAGES/DRESSINGS) ×2 IMPLANT
BASE TIBIAL ROT PLAT SZ 5 KNEE (Knees) IMPLANT
BLADE SAG 18X100X1.27 (BLADE) ×2 IMPLANT
BLADE SAW SGTL 11.0X1.19X90.0M (BLADE) ×2 IMPLANT
BLADE SAW SGTL 13.0X1.19X90.0M (BLADE) ×2 IMPLANT
BSPLAT TIB 5 CMNT ROT PLAT STR (Knees) ×1 IMPLANT
CEMENT HV SMART SET (Cement) ×4 IMPLANT
CLOTH 2% CHLOROHEXIDINE 3PK (PERSONAL CARE ITEMS) ×2 IMPLANT
COVER SURGICAL LIGHT HANDLE (MISCELLANEOUS) ×2 IMPLANT
CUFF TOURN SGL QUICK 34 (TOURNIQUET CUFF) ×2
CUFF TRNQT CYL 34X4X40X1 (TOURNIQUET CUFF) ×1 IMPLANT
DECANTER SPIKE VIAL GLASS SM (MISCELLANEOUS) ×2 IMPLANT
DRAPE INCISE IOBAN 66X45 STRL (DRAPES) IMPLANT
DRAPE LG THREE QUARTER DISP (DRAPES) ×2 IMPLANT
DRAPE ORTHO SPLIT 77X108 STRL (DRAPES) ×4
DRAPE SHEET LG 3/4 BI-LAMINATE (DRAPES) ×2 IMPLANT
DRAPE SURG ORHT 6 SPLT 77X108 (DRAPES) ×2 IMPLANT
DRAPE U-SHAPE 47X51 STRL (DRAPES) ×2 IMPLANT
DRSG AQUACEL AG ADV 3.5X10 (GAUZE/BANDAGES/DRESSINGS) ×1 IMPLANT
DRSG TEGADERM 4X4.75 (GAUZE/BANDAGES/DRESSINGS) IMPLANT
DURAPREP 26ML APPLICATOR (WOUND CARE) ×2 IMPLANT
ELECT BLADE TIP CTD 4 INCH (ELECTRODE) ×2 IMPLANT
ELECT REM PT RETURN 15FT ADLT (MISCELLANEOUS) ×2 IMPLANT
EVACUATOR 1/8 PVC DRAIN (DRAIN) IMPLANT
GAUZE SPONGE 2X2 8PLY STRL LF (GAUZE/BANDAGES/DRESSINGS) IMPLANT
GLOVE BIOGEL PI IND STRL 7.0 (GLOVE) IMPLANT
GLOVE BIOGEL PI IND STRL 7.5 (GLOVE) ×1 IMPLANT
GLOVE BIOGEL PI IND STRL 8 (GLOVE) ×1 IMPLANT
GLOVE BIOGEL PI IND STRL 9 (GLOVE) IMPLANT
GLOVE BIOGEL PI INDICATOR 7.0 (GLOVE) ×1
GLOVE BIOGEL PI INDICATOR 7.5 (GLOVE) ×3
GLOVE BIOGEL PI INDICATOR 8 (GLOVE) ×1
GLOVE BIOGEL PI INDICATOR 9 (GLOVE) ×1
GLOVE ECLIPSE 8.5 STRL (GLOVE) ×1 IMPLANT
GLOVE SURG SS PI 7.0 STRL IVOR (GLOVE) ×1 IMPLANT
GLOVE SURG SS PI 7.5 STRL IVOR (GLOVE) ×4 IMPLANT
GLOVE SURG SS PI 8.0 STRL IVOR (GLOVE) ×4 IMPLANT
GOWN SPEC L4 XLG W/TWL (GOWN DISPOSABLE) ×3 IMPLANT
GOWN STRL REUS W/TWL XL LVL3 (GOWN DISPOSABLE) ×4 IMPLANT
HANDPIECE INTERPULSE COAX TIP (DISPOSABLE) ×2
HEMOSTAT SPONGE AVITENE ULTRA (HEMOSTASIS) ×1 IMPLANT
HOLDER FOLEY CATH W/STRAP (MISCELLANEOUS) ×1 IMPLANT
IMMOBILIZER KNEE 20 (SOFTGOODS) ×2
IMMOBILIZER KNEE 20 THIGH 36 (SOFTGOODS) ×1 IMPLANT
MANIFOLD NEPTUNE II (INSTRUMENTS) ×2 IMPLANT
NDL SAFETY ECLIPSE 18X1.5 (NEEDLE) IMPLANT
NEEDLE HYPO 18GX1.5 SHARP (NEEDLE)
NS IRRIG 1000ML POUR BTL (IV SOLUTION) IMPLANT
PACK TOTAL KNEE CUSTOM (KITS) ×2 IMPLANT
PIN STEINMAN FIXATION KNEE (PIN) ×1 IMPLANT
POSITIONER SURGICAL ARM (MISCELLANEOUS) ×2 IMPLANT
SEALER BIPOLAR AQUA 6.0 (INSTRUMENTS) IMPLANT
SET HNDPC FAN SPRY TIP SCT (DISPOSABLE) ×1 IMPLANT
SPONGE GAUZE 2X2 STER 10/PKG (GAUZE/BANDAGES/DRESSINGS)
SPONGE SURGIFOAM ABS GEL 100 (HEMOSTASIS) IMPLANT
STAPLER VISISTAT (STAPLE) IMPLANT
STRIP CLOSURE SKIN 1/2X4 (GAUZE/BANDAGES/DRESSINGS) IMPLANT
SUT BONE WAX W31G (SUTURE) IMPLANT
SUT MNCRL AB 4-0 PS2 18 (SUTURE) IMPLANT
SUT STRATAFIX 0 PDS 27 VIOLET (SUTURE) ×2
SUT VIC AB 1 CT1 27 (SUTURE) ×6
SUT VIC AB 1 CT1 27XBRD ANTBC (SUTURE) ×2 IMPLANT
SUT VIC AB 1 CTX 36 (SUTURE)
SUT VIC AB 1 CTX36XBRD ANBCTR (SUTURE) IMPLANT
SUT VIC AB 2-0 CT1 27 (SUTURE) ×4
SUT VIC AB 2-0 CT1 TAPERPNT 27 (SUTURE) ×3 IMPLANT
SUTURE STRATFX 0 PDS 27 VIOLET (SUTURE) ×1 IMPLANT
SYR 3ML LL SCALE MARK (SYRINGE) IMPLANT
SYR 50ML LL SCALE MARK (SYRINGE) IMPLANT
TIBIAL BASE ROT PLAT SZ 5 KNEE (Knees) ×2 IMPLANT
TOWER CARTRIDGE SMART MIX (DISPOSABLE) ×2 IMPLANT
TRAY FOLEY CATH 14FRSI W/METER (CATHETERS) ×1 IMPLANT
TRAY FOLEY MTR SLVR 16FR STAT (SET/KITS/TRAYS/PACK) ×1 IMPLANT
WATER STERILE IRR 1000ML POUR (IV SOLUTION) ×2 IMPLANT
WRAP KNEE MAXI GEL POST OP (GAUZE/BANDAGES/DRESSINGS) ×2 IMPLANT
YANKAUER SUCT BULB TIP 10FT TU (MISCELLANEOUS) ×2 IMPLANT

## 2018-09-02 NOTE — Op Note (Signed)
NAME: Amber Wu, WYNES MEDICAL RECORD PZ:[id number] ACCOUNT [id number] DATE OF BIRTH:[birth year] FACILITY: [facility] LOCATION: [facility] PHYSICIAN:Dylon Correa Windy Kalata, MD  OPERATIVE REPORT  DATE OF PROCEDURE:  09/02/2018  PREOPERATIVE DIAGNOSIS:  End-stage osteoarthritis, varus deformity of the right knee.  POSTOPERATIVE DIAGNOSIS:  End-stage osteoarthritis, varus deformity of the right knee.  PROCEDURE PERFORMED:  Right total knee arthroplasty utilizing DePuy Attune rotating platform, #5 femur, tibia 5 insert, 38 patella.  ANESTHESIA:  Spinal.  ASSISTANT:  Lacie Draft, PA.  HISTORY:  A 77 year old end-stage osteoarthrosis varus deformity, medial compartment, failing conservative treatment.  Negative affect to her activities of daily living.  She was indicated for replacement of the degenerated joint.  Risks and benefits  discussed including bleeding, infection, damage to neurovascular structures, DVT, PE, anesthetic complications.  They were for revision component failure, etc.  DESCRIPTION OF PROCEDURE:  With the patient in supine position after induction of adequate spinal anesthesia and 2 grams Kefzol, the right lower extremity was prepped and draped and exsanguinated in usual sterile fashion.  Thigh tourniquet inflated to  250 mmHg.  Midline incision was then made over the knee.  Full thickness flaps developed.  Median parapatellar arthrotomy performed.  Soft tissue was elevated medially, protecting the MCL.  The knee was flexed, patella everted.  Tricompartmental  osteoarthrosis was noted with bone-on-bone medial and lateral.  Osteophytes were removed with a rongeur.  Remnants of medial and lateral menisci removed.  Fat pad debrided.  Step drill was utilized and the femoral canal was irrigated.  A 5 degree right  with 10 off the distal femur was utilized with a slight flexion contracture.  Osteoporosis was noted.  We performed a distal femoral cut.  I then gently subluxed the tibia.  I then  used a femoral guide and gauge, measured to a 5 then a 6 and a 5 off the  anterior cortex felt satisfactory.  This was pinned at 3 degrees of external rotation.  I performed anterior, posterior chamfer cuts. protecting the soft tissues posteriorly at all times.  I then subluxed the tibia.  Removed 2 osteophytes posteriorly.   Maximized the surface with the baseplate utilizing the external alignment guide 2 off the defect which was posteromedially, 10 off the high side, bisecting the tibiotalar joint parallel to the shaft.  We performed this cut and then we checked our  extension gap, which was excellent at 5 mm.  The knee was then flexed  tibia.  Tibia subluxed, measured to a 5.  Pinned, harvested bone centrally again osteoporosis was noted.  I placed in the distal femur.  The central drill and a punch guide was  utilized.  We then turned our attention to the femur.  We performed our box cut, bisecting the canal.  Following this, we placed our trial femur and drilled out lug holes, placed a 5 insert, reduced it had full extension, full flexion, good stability to  varus valgus stress in 0 to 30 degrees.  Osteophytes were removed, again with the knee in flexion.  We had removed some osteophytes posteriorly with a curved Crego protecting the posterior elements at all times.  Then, we everted the patella.  It was  measured at a 22.  We planed it to a 15 utilizing the external patellar jig.  Residual amount was 15.  Measured it to a 38, drilled our peg holes medializing them.  This was after a trial paddle parallel to the joint surface.  We reduced the patella and  had excellent  patellofemoral tracking.  We removed all instrumentation.  Checked posteriorly.  Removed residual menisci.  Cauterized the geniculates.  The popliteus was intact.  We flexed the knee after pulsatile lavage thoroughly.  Flexion knee, dried  all surfaces thoroughly.  Mixed cement on the back table in appropriate fashion.  Injected into the  tibia digitally pressurizing it and cemented and impacted the tibial tray.  Cemented and impacted the femur.  Redundant cement removed.  We inserted the 5  insert, reduced it without difficulty and held it in extension with an axial load throughout the curing of the cement, we cemented and clamped the patella.  After curing of the cement, the tourniquet was deflated at 63 minutes.  Any bleeding, which was  minimal, was cauterized.  I then flexed the knee.  Redundant cement meticulously removed as well as the trial.  We copiously irrigated with pulsatile lavage antibiotic irrigation.  I had placed with 0.5% Marcaine with epinephrine in the joint, while the  cement was curing.  I placed a 5 permanent insert, reduced it without difficulty, had full extension, full flexion, good stability with varus valgus stressing at 0 to 30 degrees.  Negative anterior drawer.  We copiously irrigated with antibiotic  irrigation.  Knee was placed in slight flexion, we repaired the patellar arthrotomy with #1 Vicryl interrupted figure-of-eight sutures and then oversewed with a running Stratafix.  Following this, had excellent patellofemoral tracking.  Full flexion and  full extension.  Good stability.  Next, copious irrigation.  Subcutaneous with 2-0 and skin with staples.  The patient had flexion to gravity at 90 degrees.  Sterile dressing applied, placed in the immobilizer and transported to the recovery room in satisfactory condition.  The patient tolerated the procedure well.  No complications.  Assistant Lacie Draft, [place].  Minimal blood loss.  AN/NUANCE  D:09/02/2018 T:09/02/2018 JOB:[id number]

## 2018-09-02 NOTE — Brief Op Note (Signed)
09/02/2018  3:38 PM  PATIENT:  Amber Wu  77 y.o. female  PRE-OPERATIVE DIAGNOSIS:  Right knee degenerative joint disease  POST-OPERATIVE DIAGNOSIS:  Right knee degenerative joint disease  PROCEDURE:  Procedure(s) with comments: RIGHT TOTAL KNEE ARTHROPLASTY (Right) - 2 hrs  SURGEON:  Surgeon(s) and Role:    Susa Day, MD - Primary  PHYSICIAN ASSISTANT:   ASSISTANTS: [place]   ANESTHESIA:   spinal  EBL:  75 mL   BLOOD ADMINISTERED:none  DRAINS: none   LOCAL MEDICATIONS USED:  MARCAINE     SPECIMEN:  No Specimen  DISPOSITION OF SPECIMEN:  N/A  COUNTS:  yes  TOURNIQUET:   Total Tourniquet Time Documented: Thigh (Right) - 63 minutes Total: Thigh (Right) - 63 minutes   DICTATION: .Other Dictation: Dictation Number [id number]  PLAN OF CARE: Admit to inpatient   PATIENT DISPOSITION:  PACU - hemodynamically stable.   Delay start of Pharmacological VTE agent (>24hrs) due to surgical blood loss or risk of bleeding: no

## 2018-09-02 NOTE — Anesthesia Preprocedure Evaluation (Addendum)
Anesthesia Evaluation  Patient identified by MRN, date of birth, ID band Patient awake    Reviewed: Allergy & Precautions, NPO status , Patient's Chart, lab work & pertinent test results  Airway Mallampati: II  TM Distance: >3 FB Neck ROM: Full    Dental  (+) Dental Advisory Given   Pulmonary COPD, former smoker,    breath sounds clear to auscultation       Cardiovascular hypertension, Pt. on medications + dysrhythmias (Last pradaxa >96hrs ago) Atrial Fibrillation  Rhythm:Irregular Rate:Normal     Neuro/Psych Depression negative neurological ROS     GI/Hepatic Neg liver ROS, hiatal hernia, GERD  ,  Endo/Other  Hypothyroidism   Renal/GU CRFRenal disease     Musculoskeletal  (+) Arthritis ,   Abdominal   Peds  Hematology negative hematology ROS (+)   Anesthesia Other Findings   Reproductive/Obstetrics                            Lab Results  Component Value Date   WBC 7.2 08/26/2018   HGB 13.2 08/26/2018   HCT 40.2 08/26/2018   MCV 92.2 08/26/2018   PLT 312 08/26/2018   Lab Results  Component Value Date   CREATININE 1.02 (H) 08/26/2018   BUN 14 08/26/2018   NA 144 08/26/2018   K 4.5 08/26/2018   CL 106 08/26/2018   CO2 28 08/26/2018    Anesthesia Physical Anesthesia Plan  ASA: III  Anesthesia Plan: MAC and Spinal   Post-op Pain Management:  Regional for Post-op pain   Induction: Intravenous  PONV Risk Score and Plan: 2 and Propofol infusion, Ondansetron and Treatment may vary due to age or medical condition  Airway Management Planned: Natural Airway and Simple Face Mask  Additional Equipment:   Intra-op Plan:   Post-operative Plan:   Informed Consent: I have reviewed the patients History and Physical, chart, labs and discussed the procedure including the risks, benefits and alternatives for the proposed anesthesia with the patient or authorized representative who  has indicated his/her understanding and acceptance.   Dental advisory given  Plan Discussed with: CRNA  Anesthesia Plan Comments:        Anesthesia Quick Evaluation

## 2018-09-02 NOTE — Plan of Care (Signed)
Plan of care 

## 2018-09-02 NOTE — Progress Notes (Signed)
Assisted Dr. Suzette Battiest  with right adductor canal block. Side rails up, monitors on throughout procedure. See vital signs in flow sheet. Tolerated Procedure well.

## 2018-09-02 NOTE — Care Plan (Signed)
R TKA scheduled on 09-02-18 DCP:  Home with dtr-in-law.  1 story home with 0 stel DME:  Needs a RW.   Has elevated toilets with grab bar. PT:  Virtual PT

## 2018-09-02 NOTE — Interval H&P Note (Signed)
History and Physical Interval Note:  09/02/2018 12:39 PM  Amber Wu  has presented today for surgery, with the diagnosis of Right knee degenerative joint disease  The various methods of treatment have been discussed with the patient and family. After consideration of risks, benefits and other options for treatment, the patient has consented to  Procedure(s) with comments: RIGHT TOTAL KNEE ARTHROPLASTY (Right) - 2 hrs as a surgical intervention .  The patient's history has been reviewed, patient examined, no change in status, stable for surgery.  I have reviewed the patient's chart and labs.  Questions were answered to the patient's satisfaction.     Nalina Yeatman C

## 2018-09-02 NOTE — Transfer of Care (Signed)
Immediate Anesthesia Transfer of Care Note  Patient: Amber Wu  Procedure(s) Performed: RIGHT TOTAL KNEE ARTHROPLASTY (Right Knee)  Patient Location: PACU  Anesthesia Type:MAC, Regional and Spinal  Level of Consciousness: awake, alert  and oriented  Airway & Oxygen Therapy: Patient Spontanous Breathing and Patient connected to face mask oxygen  Post-op Assessment: Report given to RN and Post -op Vital signs reviewed and stable  Post vital signs: Reviewed and stable  Last Vitals:  Vitals Value Taken Time  BP 117/49 09/02/2018  4:00 PM  Temp    Pulse 138 09/02/2018  4:01 PM  Resp 12 09/02/2018  4:01 PM  SpO2 90 % 09/02/2018  4:01 PM  Vitals shown include unvalidated device data.  Last Pain:  Vitals:   09/02/18 1300  TempSrc:   PainSc: 0-No pain         Complications: No apparent anesthesia complications

## 2018-09-02 NOTE — Anesthesia Procedure Notes (Signed)
Spinal  Patient location during procedure: OR Start time: 09/02/2018 1:50 PM End time: 09/02/2018 1:54 PM Staffing Resident/CRNA: Gwyndolyn Saxon, CRNA Performed: resident/CRNA  Preanesthetic Checklist Completed: patient identified, site marked, surgical consent, pre-op evaluation, timeout performed, IV checked, risks and benefits discussed and monitors and equipment checked Spinal Block Patient position: sitting Prep: ChloraPrep Patient monitoring: heart rate, continuous pulse ox and blood pressure Approach: midline Location: L4-5 Injection technique: single-shot Needle Needle type: Pencan  Needle gauge: 24 G Needle length: 10 cm Assessment Sensory level: T10

## 2018-09-02 NOTE — Discharge Instructions (Signed)

## 2018-09-02 NOTE — Anesthesia Procedure Notes (Signed)
Anesthesia Regional Block: Adductor canal block   Pre-Anesthetic Checklist: ,, timeout performed, Correct Patient, Correct Site, Correct Laterality, Correct Procedure, Correct Position, site marked, Risks and benefits discussed,  Surgical consent,  Pre-op evaluation,  At surgeon's request and post-op pain management  Laterality: Right  Prep: chloraprep       Needles:  Injection technique: Single-shot  Needle Type: Echogenic Needle     Needle Length: 9cm  Needle Gauge: 21     Additional Needles:   Procedures:,,,, ultrasound used (permanent image in chart),,,,  Narrative:  Start time: 09/02/2018 12:30 PM End time: 09/02/2018 12:36 PM Injection made incrementally with aspirations every 5 mL.  Performed by: Personally  Anesthesiologist: Suzette Battiest, MD

## 2018-09-03 LAB — BASIC METABOLIC PANEL
ANION GAP: 11 (ref 5–15)
BUN: 12 mg/dL (ref 8–23)
CHLORIDE: 105 mmol/L (ref 98–111)
CO2: 26 mmol/L (ref 22–32)
Calcium: 8.6 mg/dL — ABNORMAL LOW (ref 8.9–10.3)
Creatinine, Ser: 1.03 mg/dL — ABNORMAL HIGH (ref 0.44–1.00)
GFR, EST AFRICAN AMERICAN: 59 mL/min — AB (ref >60)
GFR, EST NON AFRICAN AMERICAN: 51 mL/min — AB (ref >60)
Glucose, Bld: 170 mg/dL — ABNORMAL HIGH (ref 70–99)
POTASSIUM: 3.9 mmol/L (ref 3.5–5.1)
SODIUM: 142 mmol/L (ref 135–145)

## 2018-09-03 LAB — CBC
HCT: 36.3 % (ref 36.0–46.0)
HEMOGLOBIN: 11.9 g/dL — AB (ref 12.0–15.0)
MCH: 30.2 pg (ref 26.0–34.0)
MCHC: 32.8 g/dL (ref 30.0–36.0)
MCV: 92.1 fL (ref 78.0–100.0)
PLATELETS: 266 10*3/uL (ref 150–400)
RBC: 3.94 MIL/uL (ref 3.87–5.11)
RDW: 14.1 % (ref 11.5–15.5)
WBC: 11 10*3/uL — AB (ref 4.0–10.5)

## 2018-09-03 NOTE — Evaluation (Signed)
Physical Therapy Evaluation Patient Details Name: Amber Wu MRN: [id number] DOB: [birth year] Today's Date: 09/03/2018   History of Present Illness  Pt s/p R TKR and with hx of COPD and a-fib  Clinical Impression  Pt s/p R TKR and presents with decreased R LE strength/ROM and post op pain limiting functional mobility.  Pt should progress to dc home with intermittent assist of family/friends and follow up virtual HHPT.    Follow Up Recommendations Follow surgeon's recommendation for DC plan and follow-up therapies    Equipment Recommendations  Rolling walker with 5" wheels    Recommendations for Other Services       Precautions / Restrictions Precautions Precautions: Knee;Fall Restrictions Weight Bearing Restrictions: No Other Position/Activity Restrictions: WBAT      Mobility  Bed Mobility Overal bed mobility: Needs Assistance Bed Mobility: Supine to Sit     Supine to sit: Min assist     General bed mobility comments: cues for sequence and use of L LE to self assist  Transfers Overall transfer level: Needs assistance Equipment used: Rolling walker (2 wheeled) Transfers: Sit to/from Stand Sit to Stand: Min assist         General transfer comment: Cues for LE management and use of UEs to self assist  Ambulation/Gait Ambulation/Gait assistance: Min assist Gait Distance (Feet): 25 Feet(twice)            Stairs            Wheelchair Mobility    Modified Rankin (Stroke Patients Only)       Balance Overall balance assessment: Mild deficits observed, not formally tested                                           Pertinent Vitals/Pain Pain Assessment: 0-10 Pain Score: 4  Pain Location: R knee Pain Descriptors / Indicators: Aching;Sore Pain Intervention(s): Limited activity within patient's tolerance;Monitored during session;Premedicated before session;Ice applied    Home Living Family/patient expects to be discharged to::  Private residence Living Arrangements: Alone Available Help at Discharge: Available PRN/intermittently;Friend(s);Family Type of Home: House Home Access: Stairs to enter Entrance Stairs-Rails: Right;Left;Can reach both Technical brewer of Steps: 1+2 Home Layout: One level Home Equipment: None      Prior Function Level of Independence: Independent               Hand Dominance        Extremity/Trunk Assessment   Upper Extremity Assessment Upper Extremity Assessment: Overall WFL for tasks assessed    Lower Extremity Assessment Lower Extremity Assessment: RLE deficits/detail RLE Deficits / Details: 3/5 quads with AAROM at knee -8 - 90       Communication   Communication: No difficulties  Cognition Arousal/Alertness: Awake/alert Behavior During Therapy: WFL for tasks assessed/performed Overall Cognitive Status: Within Functional Limits for tasks assessed                                        General Comments      Exercises Total Joint Exercises Ankle Circles/Pumps: AROM;20 reps;Supine;Both Quad Sets: AROM;Both;10 reps;Supine Heel Slides: AAROM;Right;15 reps;Supine Straight Leg Raises: AAROM;AROM;Right;15 reps;Supine   Assessment/Plan    PT Assessment Patient needs continued PT services  PT Problem List Decreased strength;Decreased range of motion;Decreased activity tolerance;Decreased mobility;Decreased knowledge of use of  DME;Pain       PT Treatment Interventions DME instruction;Gait training;Stair training;Functional mobility training;Therapeutic activities;Therapeutic exercise;Patient/family education    PT Goals (Current goals can be found in the Care Plan section)  Acute Rehab PT Goals Patient Stated Goal: Regain IND PT Goal Formulation: With patient Time For Goal Achievement: 09/10/18 Potential to Achieve Goals: Good    Frequency 7X/week   Barriers to discharge        Co-evaluation               AM-PAC PT "6  Clicks" Daily Activity  Outcome Measure Difficulty turning over in bed (including adjusting bedclothes, sheets and blankets)?: Unable Difficulty moving from lying on back to sitting on the side of the bed? : Unable Difficulty sitting down on and standing up from a chair with arms (e.g., wheelchair, bedside commode, etc,.)?: Unable Help needed moving to and from a bed to chair (including a wheelchair)?: A Little Help needed walking in hospital room?: A Little Help needed climbing 3-5 steps with a railing? : A Lot 6 Click Score: 11    End of Session Equipment Utilized During Treatment: Gait belt Activity Tolerance: Patient tolerated treatment well Patient left: in chair;with call bell/phone within reach;with chair alarm set Nurse Communication: Mobility status PT Visit Diagnosis: Difficulty in walking, not elsewhere classified (R26.2)    Time: 7824-2353 PT Time Calculation (min) (ACUTE ONLY): 34 min   Charges:   PT Evaluation $PT Eval Low Complexity: 1 Low PT Treatments $Therapeutic Exercise: 8-22 mins        [place] Pager [phone] Office [phone]   Mac Dowdell 09/03/2018, 1:12 PM

## 2018-09-03 NOTE — Anesthesia Postprocedure Evaluation (Signed)
Anesthesia Post Note  Patient: Amber Wu  Procedure(s) Performed: RIGHT TOTAL KNEE ARTHROPLASTY (Right Knee)     Patient location during evaluation: PACU Anesthesia Type: Spinal Level of consciousness: awake and alert Pain management: pain level controlled Vital Signs Assessment: post-procedure vital signs reviewed and stable Respiratory status: spontaneous breathing and respiratory function stable Cardiovascular status: blood pressure returned to baseline and stable Postop Assessment: spinal receding Anesthetic complications: no    Last Vitals:  Vitals:   09/03/18 0529 09/03/18 0947  BP: (!) 143/80 130/71  Pulse: 68 (!) 56  Resp: 17 16  Temp: 36.7 C 37 C  SpO2: 100% 96%    Last Pain:  Vitals:   09/03/18 0947  TempSrc: Oral  PainSc:                  [name]

## 2018-09-03 NOTE — Progress Notes (Signed)
Patient ID: Amber Wu, female   DOB: [birth year], 77 y.o.   MRN: [id number] Subjective: 1 Day Post-Op Procedure(s) (LRB): RIGHT TOTAL KNEE ARTHROPLASTY (Right) Patient reports pain as moderate.    Patient has complaints of R kne pain.  We will start therapy today. Plan is to go home with virtual PT after hospital stay. Seen by Dr. Tonita Wu in AM rounds.  Objective: Vital signs in last 24 hours: Temp:  [97.3 F (36.3 C)-98.5 F (36.9 C)] 98 F (36.7 C) (09/26 0529) Pulse Rate:  [34-116] 68 (09/26 0529) Resp:  [11-21] 17 (09/26 0529) BP: (105-150)/(47-84) 143/80 (09/26 0529) SpO2:  [88 %-100 %] 100 % (09/26 0529) Weight:  [104.4 kg] 104.4 kg (09/25 1122)  Intake/Output from previous day:  Intake/Output Summary (Last 24 hours) at 09/03/2018 0932 Last data filed at 09/03/2018 0817 Gross per 24 hour  Intake 3511.62 ml  Output 1125 ml  Net 2386.62 ml    Intake/Output this shift: Total I/O In: 120 [P.O.:120] Out: -   Labs: Results for orders placed or performed during the hospital encounter of 09/02/18  APTT  Result Value Ref Range   aPTT 29 24 - 36 seconds  CBC  Result Value Ref Range   WBC 11.0 (H) 4.0 - 10.5 K/uL   RBC 3.94 3.87 - 5.11 MIL/uL   Hemoglobin 11.9 (L) 12.0 - 15.0 g/dL   HCT 36.3 36.0 - 46.0 %   MCV 92.1 78.0 - 100.0 fL   MCH 30.2 26.0 - 34.0 pg   MCHC 32.8 30.0 - 36.0 g/dL   RDW 14.1 11.5 - 15.5 %   Platelets 266 150 - 400 K/uL  Basic metabolic panel  Result Value Ref Range   Sodium 142 135 - 145 mmol/L   Potassium 3.9 3.5 - 5.1 mmol/L   Chloride 105 98 - 111 mmol/L   CO2 26 22 - 32 mmol/L   Glucose, Bld 170 (H) 70 - 99 mg/dL   BUN 12 8 - 23 mg/dL   Creatinine, Ser 1.03 (H) 0.44 - 1.00 mg/dL   Calcium 8.6 (L) 8.9 - 10.3 mg/dL   GFR calc non Af Amer 51 (L) >60 mL/min   GFR calc Af Amer 59 (L) >60 mL/min   Anion gap 11 5 - 15    Exam - Neurologically intact ABD soft Neurovascular intact Sensation intact distally Intact pulses  distally Dorsiflexion/Plantar flexion intact Incision: dressing C/D/I and no drainage No cellulitis present Compartment soft no calf pain or sign of DVT Dressing - clean, dry, no drainage Motor function intact - moving foot and toes well on exam.   Assessment/Plan: 1 Day Post-Op Procedure(s) (LRB): RIGHT TOTAL KNEE ARTHROPLASTY (Right)  Advance diet Up with therapy D/C IV fluids Past Medical History:  Diagnosis Date  . Allergy   . Arthritis   . Atrial fibrillation ([place])    2D ECHO, 08/12/2012 - EF >55%, LA moderately dilated, RA moderately dilated, moderate tricuspid valve regurgitation  . Barrett's esophagus   . COPD (chronic obstructive pulmonary disease) ([place])   . Depression   . Dysphonia   . Gastroparesis   . GERD (gastroesophageal reflux disease)   . Hiatal hernia   . History of esophageal stricture   . Hypertension   . Hypothyroidism   . Shortness of breath dyspnea     DVT Prophylaxis - Pradaxa (holding ASA) Weight-Bearing as tolerated to Right leg No vaccines.  Anticipated LOS equal to or greater than 2 midnights due to - Age 70 and  older with one or more of the following:  - Obesity  - Expected need for hospital services (PT, OT, Nursing) required for safe  discharge  - Anticipated need for postoperative skilled nursing care or inpatient rehab  - Active co-morbidities: Cardiac Arrhythmia  Plan D/C home with virtual PT tomorrow vs. Saturday depending on pain and progress with PT. Will continue Pradaxa for DVT ppx and hold ASA until follow up  Amber Wu M. 09/03/2018, 9:32 AM

## 2018-09-03 NOTE — Progress Notes (Signed)
Physical Therapy Treatment Patient Details Name: Amber Wu MRN: [id number] DOB: 12/19/1940 Today's Date: 09/03/2018    History of Present Illness Pt s/p R TKR and with hx of COPD and a-fib    PT Comments    Pt continues very motivated and with increased activity tolerance this pm but also demonstrating questionable safety awareness, slower processing and increased difficulty with problem solving - RN also noted similar issues.  Follow Up Recommendations  Follow surgeon's recommendation for DC plan and follow-up therapies     Equipment Recommendations  Rolling walker with 5" wheels    Recommendations for Other Services       Precautions / Restrictions Precautions Precautions: Knee;Fall Restrictions Weight Bearing Restrictions: No Other Position/Activity Restrictions: WBAT    Mobility  Bed Mobility Overal bed mobility: Needs Assistance Bed Mobility: Sit to Supine     Supine to sit: Min assist Sit to supine: Min assist   General bed mobility comments: cues for sequence and use of L LE to self assist  Transfers Overall transfer level: Needs assistance Equipment used: Rolling walker (2 wheeled) Transfers: Sit to/from Stand Sit to Stand: Min assist;Min guard         General transfer comment: Cues for LE management and use of UEs to self assist  Ambulation/Gait Ambulation/Gait assistance: Min assist Gait Distance (Feet): 90 Feet Assistive device: Rolling walker (2 wheeled) Gait Pattern/deviations: Step-to pattern;Step-through pattern;Decreased step length - right;Decreased step length - left;Shuffle;Trendelenburg Gait velocity: decr   General Gait Details: cues for posture, position from RW and initial sequence   Stairs             Wheelchair Mobility    Modified Rankin (Stroke Patients Only)       Balance Overall balance assessment: Mild deficits observed, not formally tested                                           Cognition Arousal/Alertness: Awake/alert Behavior During Therapy: WFL for tasks assessed/performed Overall Cognitive Status: Impaired/Different from baseline Area of Impairment: Following commands;Problem solving;Safety/judgement                       Following Commands: Follows one step commands inconsistently Safety/Judgement: Decreased awareness of safety   Problem Solving: Slow processing General Comments: Pt with increased difficulty problem solving and processing commands vs this am      Exercises Total Joint Exercises Ankle Circles/Pumps: AROM;20 reps;Supine;Both Quad Sets: AROM;Both;10 reps;Supine Heel Slides: AAROM;Right;15 reps;Supine Straight Leg Raises: AAROM;AROM;Right;15 reps;Supine    General Comments        Pertinent Vitals/Pain Pain Assessment: 0-10 Pain Score: 5  Pain Location: R knee Pain Descriptors / Indicators: Aching;Sore Pain Intervention(s): Limited activity within patient's tolerance;Monitored during session;Premedicated before session;Ice applied    Home Living Family/patient expects to be discharged to:: Private residence Living Arrangements: Alone Available Help at Discharge: Available PRN/intermittently;Friend(s);Family Type of Home: House Home Access: Stairs to enter Entrance Stairs-Rails: Right;Left;Can reach both Home Layout: One level Home Equipment: None      Prior Function Level of Independence: Independent          PT Goals (current goals can now be found in the care plan section) Acute Rehab PT Goals Patient Stated Goal: Regain IND PT Goal Formulation: With patient Time For Goal Achievement: 09/10/18 Potential to Achieve Goals: Good Progress towards PT goals: Progressing toward  goals    Frequency    7X/week      PT Plan Current plan remains appropriate    Co-evaluation              AM-PAC PT "6 Clicks" Daily Activity  Outcome Measure  Difficulty turning over in bed (including adjusting  bedclothes, sheets and blankets)?: Unable Difficulty moving from lying on back to sitting on the side of the bed? : Unable Difficulty sitting down on and standing up from a chair with arms (e.g., wheelchair, bedside commode, etc,.)?: Unable Help needed moving to and from a bed to chair (including a wheelchair)?: A Little Help needed walking in hospital room?: A Little Help needed climbing 3-5 steps with a railing? : A Lot 6 Click Score: 11    End of Session Equipment Utilized During Treatment: Gait belt Activity Tolerance: Patient tolerated treatment well Patient left: in bed;with call bell/phone within reach;with family/visitor present Nurse Communication: Mobility status PT Visit Diagnosis: Difficulty in walking, not elsewhere classified (R26.2)     Time: 1660-6301 PT Time Calculation (min) (ACUTE ONLY): 22 min  Charges:  $Gait Training: 8-22 mins $Therapeutic Exercise: 8-22 mins                     [place] Pager [phone] Office [phone]    Jayen Bromwell 09/03/2018, 3:49 PM

## 2018-09-03 NOTE — Care Management Note (Signed)
Case Management Note  Patient Details  Name: Amber Wu MRN: [id number] Date of Birth: [birth year]  Subjective/Objective:       Spoke with patient at bedside. Confirmed plan for virtual PT, already arranged. Needs a RW and 3n1. [phone]             Action/Plan: Contacted [facility] to deliver DME to patients room.  Expected Discharge Date:                  Expected Discharge Plan:  Home/Self Care  In-House Referral:  NA  Discharge planning Services  CM Consult  Post Acute Care Choice:  Durable Medical Equipment Choice offered to:  Patient  DME Arranged:  3-N-1, Walker rolling DME Agency:  [place]:  NA [place] Agency:  NA  Status of Service:  Completed, signed off  If discussed at [place] of Stay Meetings, dates discussed:    Additional Comments:  Guadalupe Maple, RN 09/03/2018, 11:06 AM

## 2018-09-03 NOTE — Progress Notes (Signed)
CSW consult-SNF Plan:Plan D/C home with virtual PT tomorrow vs. Saturday depending on pain and progress with PT.  Kathrin Greathouse, Marlinda Mike, MSW Clinical Social Worker  [phone] 09/03/2018  11:04 AM

## 2018-09-04 ENCOUNTER — Encounter (HOSPITAL_COMMUNITY): Payer: Self-pay | Admitting: Specialist

## 2018-09-04 LAB — CBC
HCT: 33.6 % — ABNORMAL LOW (ref 36.0–46.0)
HEMOGLOBIN: 11 g/dL — AB (ref 12.0–15.0)
MCH: 30.2 pg (ref 26.0–34.0)
MCHC: 32.7 g/dL (ref 30.0–36.0)
MCV: 92.3 fL (ref 78.0–100.0)
PLATELETS: 280 10*3/uL (ref 150–400)
RBC: 3.64 MIL/uL — AB (ref 3.87–5.11)
RDW: 14.5 % (ref 11.5–15.5)
WBC: 8.7 10*3/uL (ref 4.0–10.5)

## 2018-09-04 NOTE — Progress Notes (Signed)
Will arrange HHPT Leonides Grills (pt's case manager) has contacted [place] home [facility] to arrange for D/C Plan D/C tomorrow

## 2018-09-04 NOTE — Progress Notes (Signed)
Patient ID: Amber Wu, female   DOB: [birth year], 77 y.o.   MRN: [id number] Subjective: 2 Days Post-Op Procedure(s) (LRB): RIGHT TOTAL KNEE ARTHROPLASTY (Right) Patient reports pain as mild.    Patient has complaints of R knee pain. No other c/o. Hoping to go home tomorrow. Set up for virtual PT.  Objective: Vital signs in last 24 hours: Temp:  [98.4 F (36.9 C)-99.1 F (37.3 C)] 98.4 F (36.9 C) (09/27 0509) Pulse Rate:  [56-123] 65 (09/27 0509) Resp:  [16-18] 16 (09/27 0509) BP: (127-138)/(58-93) 136/67 (09/27 0509) SpO2:  [84 %-96 %] 93 % (09/27 0509)  Intake/Output from previous day:  Intake/Output Summary (Last 24 hours) at 09/04/2018 0827 Last data filed at 09/04/2018 0510 Gross per 24 hour  Intake 754.66 ml  Output 2400 ml  Net -1645.34 ml    Intake/Output this shift: No intake/output data recorded.  Labs: Results for orders placed or performed during the hospital encounter of 09/02/18  APTT  Result Value Ref Range   aPTT 29 24 - 36 seconds  CBC  Result Value Ref Range   WBC 11.0 (H) 4.0 - 10.5 K/uL   RBC 3.94 3.87 - 5.11 MIL/uL   Hemoglobin 11.9 (L) 12.0 - 15.0 g/dL   HCT 36.3 36.0 - 46.0 %   MCV 92.1 78.0 - 100.0 fL   MCH 30.2 26.0 - 34.0 pg   MCHC 32.8 30.0 - 36.0 g/dL   RDW 14.1 11.5 - 15.5 %   Platelets 266 150 - 400 K/uL  Basic metabolic panel  Result Value Ref Range   Sodium 142 135 - 145 mmol/L   Potassium 3.9 3.5 - 5.1 mmol/L   Chloride 105 98 - 111 mmol/L   CO2 26 22 - 32 mmol/L   Glucose, Bld 170 (H) 70 - 99 mg/dL   BUN 12 8 - 23 mg/dL   Creatinine, Ser 1.03 (H) 0.44 - 1.00 mg/dL   Calcium 8.6 (L) 8.9 - 10.3 mg/dL   GFR calc non Af Amer 51 (L) >60 mL/min   GFR calc Af Amer 59 (L) >60 mL/min   Anion gap 11 5 - 15  CBC  Result Value Ref Range   WBC 8.7 4.0 - 10.5 K/uL   RBC 3.64 (L) 3.87 - 5.11 MIL/uL   Hemoglobin 11.0 (L) 12.0 - 15.0 g/dL   HCT 33.6 (L) 36.0 - 46.0 %   MCV 92.3 78.0 - 100.0 fL   MCH 30.2 26.0 - 34.0 pg   MCHC 32.7  30.0 - 36.0 g/dL   RDW 14.5 11.5 - 15.5 %   Platelets 280 150 - 400 K/uL    Exam - Neurologically intact ABD soft Neurovascular intact Sensation intact distally Intact pulses distally Dorsiflexion/Plantar flexion intact Incision: dressing C/D/I and no drainage No cellulitis present Compartment soft no calf pain or sign of DVT Dressing/Incision - clean, dry, no drainage Motor function intact - moving foot and toes well on exam.   Assessment/Plan: 2 Days Post-Op Procedure(s) (LRB): RIGHT TOTAL KNEE ARTHROPLASTY (Right)  Advance diet Up with therapy D/C IV fluids Past Medical History:  Diagnosis Date  . Allergy   . Arthritis   . Atrial fibrillation ([place])    2D ECHO, 08/12/2012 - EF >55%, LA moderately dilated, RA moderately dilated, moderate tricuspid valve regurgitation  . Barrett's esophagus   . COPD (chronic obstructive pulmonary disease) ([place])   . Depression   . Dysphonia   . Gastroparesis   . GERD (gastroesophageal reflux disease)   .  Hiatal hernia   . History of esophageal stricture   . Hypertension   . Hypothyroidism   . Shortness of breath dyspnea     DVT Prophylaxis - Pradaxa Weight-Bearing as tolerated to  Right leg Plan D/C home tomorrow Virtual PT set up. Will discuss options with case manager if needs home health. Discussed with nurse and therapist. Pt very motivated to do virtual PT. Needs cueing for reminders often and gets distracted. Will discuss with Dr. Mliss Fritz, [place]. 09/04/2018, 8:27 AM

## 2018-09-04 NOTE — Progress Notes (Signed)
Physical Therapy Treatment Patient Details Name: Amber Wu MRN: [id number] DOB: Oct 13, 1941 Today's Date: 09/04/2018    History of Present Illness Pt s/p R TKR and with hx of COPD and a-fib    PT Comments     Pt found in room and reporting shaking, feeling cold and marked increase in pain level.  Pt requiring increased assist to mobilize but assisted back to bed, ace wrap removed, provided with warm blankets and RN alerted and attending to pt.  Discussed with pt and agreed having HHPT follow up vs virtual PT would be beneficial - RN to request order from [place].  Follow Up Recommendations  Follow surgeon's recommendation for DC plan and follow-up therapies;Home health PT     Equipment Recommendations  Rolling walker with 5" wheels    Recommendations for Other Services       Precautions / Restrictions Precautions Precautions: Knee;Fall Restrictions Weight Bearing Restrictions: No Other Position/Activity Restrictions: WBAT    Mobility  Bed Mobility Overal bed mobility: Needs Assistance Bed Mobility: Sit to Supine     Supine to sit: Supervision Sit to supine: Min assist   General bed mobility comments: Increased time  Transfers Overall transfer level: Needs assistance Equipment used: Rolling walker (2 wheeled) Transfers: Sit to/from Stand Sit to Stand: Min assist;Mod assist         General transfer comment: Cues for LE management and use of UEs to self assist  Ambulation/Gait Ambulation/Gait assistance: Min assist Gait Distance (Feet): 5 Feet Assistive device: Rolling walker (2 wheeled) Gait Pattern/deviations: Step-to pattern;Step-through pattern;Decreased step length - right;Decreased step length - left;Shuffle;Trendelenburg Gait velocity: decr   General Gait Details: cues for posture, position from RW and sequence.  Pt tolerating min WB on R LE   Stairs             Wheelchair Mobility    Modified Rankin (Stroke Patients Only)       Balance  Overall balance assessment: Mild deficits observed, not formally tested                                          Cognition Arousal/Alertness: Awake/alert Behavior During Therapy: Impulsive;Anxious Overall Cognitive Status: Within Functional Limits for tasks assessed                                        Exercises Total Joint Exercises Ankle Circles/Pumps: AROM;20 reps;Supine;Both Quad Sets: AROM;Both;Supine;15 reps Heel Slides: AAROM;Right;Supine;20 reps Straight Leg Raises: AAROM;AROM;Right;Supine;20 reps Goniometric ROM: AAROM R knee -8- 85    General Comments        Pertinent Vitals/Pain Pain Assessment: 0-10 Pain Score: 9  Pain Location: R knee Pain Descriptors / Indicators: Aching;Sore Pain Intervention(s): Limited activity within patient's tolerance;Monitored during session;Patient requesting pain meds-RN notified    Home Living                      Prior Function            PT Goals (current goals can now be found in the care plan section) Acute Rehab PT Goals Patient Stated Goal: Regain IND PT Goal Formulation: With patient Time For Goal Achievement: 09/10/18 Potential to Achieve Goals: Good Progress towards PT goals: Progressing toward goals    Frequency    7X/week  PT Plan Discharge plan needs to be updated    Co-evaluation              AM-PAC PT "6 Clicks" Daily Activity  Outcome Measure  Difficulty turning over in bed (including adjusting bedclothes, sheets and blankets)?: A Lot Difficulty moving from lying on back to sitting on the side of the bed? : Unable Difficulty sitting down on and standing up from a chair with arms (e.g., wheelchair, bedside commode, etc,.)?: Unable Help needed moving to and from a bed to chair (including a wheelchair)?: A Lot Help needed walking in hospital room?: A Lot Help needed climbing 3-5 steps with a railing? : A Lot 6 Click Score: 10    End of Session  Equipment Utilized During Treatment: Gait belt Activity Tolerance: Patient limited by pain;Patient limited by fatigue Patient left: with call bell/phone within reach;in bed;with nursing/sitter in room Nurse Communication: Mobility status PT Visit Diagnosis: Difficulty in walking, not elsewhere classified (R26.2)     Time: 7543-6067 PT Time Calculation (min) (ACUTE ONLY): 17 min  Charges:  $Gait Training: 8-22 mins $Therapeutic Exercise: 8-22 mins $Therapeutic Activity: 8-22 mins                     [place] Pager [phone] Office [phone]    Xaden Kaufman 09/04/2018, 3:17 PM

## 2018-09-04 NOTE — Discharge Summary (Signed)
Physician Discharge Summary   Patient ID: Amber Wu MRN: [id number] DOB/AGE: [birth year] 77 y.o.  Admit date: 09/02/2018 Discharge date: 09/05/18  Primary Diagnosis: right knee primary osteoarthritis  Admission Diagnoses:  Past Medical History:  Diagnosis Date  . Allergy   . Arthritis   . Atrial fibrillation ([place])    2D ECHO, 08/12/2012 - EF >55%, LA moderately dilated, RA moderately dilated, moderate tricuspid valve regurgitation  . Barrett's esophagus   . COPD (chronic obstructive pulmonary disease) ([place])   . Depression   . Dysphonia   . Gastroparesis   . GERD (gastroesophageal reflux disease)   . Hiatal hernia   . History of esophageal stricture   . Hypertension   . Hypothyroidism   . Shortness of breath dyspnea    Discharge Diagnoses:   Principal Problem:   Primary osteoarthritis of right knee Active Problems:   Right knee DJD  Estimated body mass index is 38.29 kg/m as calculated from the following:   Height as of this encounter: '5\' 5"'$  (1.651 m).   Weight as of this encounter: 104.4 kg.  Procedure:  Procedure(s) (LRB): RIGHT TOTAL KNEE ARTHROPLASTY (Right)   Consults: None  HPI: see H&P Laboratory Data: Admission on 09/02/2018  Component Date Value Ref Range Status  . aPTT 09/02/2018 29  24 - 36 seconds Final   Performed at [facility], [place] [address] [place], [place] [address]  . WBC 09/03/2018 11.0* 4.0 - 10.5 K/uL Final  . RBC 09/03/2018 3.94  3.87 - 5.11 MIL/uL Final  . Hemoglobin 09/03/2018 11.9* 12.0 - 15.0 g/dL Final  . HCT 09/03/2018 36.3  36.0 - 46.0 % Final  . MCV 09/03/2018 92.1  78.0 - 100.0 fL Final  . MCH 09/03/2018 30.2  26.0 - 34.0 pg Final  . MCHC 09/03/2018 32.8  30.0 - 36.0 g/dL Final  . RDW 09/03/2018 14.1  11.5 - 15.5 % Final  . Platelets 09/03/2018 266  150 - 400 K/uL Final   Performed at [facility], [place] [address] [place], [place] [address]  . Sodium 09/03/2018 142  135 - 145 mmol/L  Final  . Potassium 09/03/2018 3.9  3.5 - 5.1 mmol/L Final  . Chloride 09/03/2018 105  98 - 111 mmol/L Final  . CO2 09/03/2018 26  22 - 32 mmol/L Final  . Glucose, Bld 09/03/2018 170* 70 - 99 mg/dL Final  . BUN 09/03/2018 12  8 - 23 mg/dL Final  . Creatinine, Ser 09/03/2018 1.03* 0.44 - 1.00 mg/dL Final  . Calcium 09/03/2018 8.6* 8.9 - 10.3 mg/dL Final  . GFR calc non Af Amer 09/03/2018 51* >60 mL/min Final  . GFR calc Af Amer 09/03/2018 59* >60 mL/min Final   Comment: (NOTE) The eGFR has been calculated using the CKD EPI equation. This calculation has not been validated in all clinical situations. eGFR's persistently <60 mL/min signify possible Chronic Kidney Disease.   Georgiann Hahn gap 09/03/2018 11  5 - 15 Final   Performed at [facility], [place] [address], [place], [place] [address]  . WBC 09/04/2018 8.7  4.0 - 10.5 K/uL Final  . RBC 09/04/2018 3.64* 3.87 - 5.11 MIL/uL Final  . Hemoglobin 09/04/2018 11.0* 12.0 - 15.0 g/dL Final  . HCT 09/04/2018 33.6* 36.0 - 46.0 % Final  . MCV 09/04/2018 92.3  78.0 - 100.0 fL Final  . MCH 09/04/2018 30.2  26.0 - 34.0 pg Final  . MCHC 09/04/2018 32.7  30.0 - 36.0 g/dL Final  .  RDW 09/04/2018 14.5  11.5 - 15.5 % Final  . Platelets 09/04/2018 280  150 - 400 K/uL Final   Performed at [facility], [place] [address], [place], [place] [address]  Hospital Outpatient Visit on 08/26/2018  Component Date Value Ref Range Status  . Sodium 08/26/2018 144  135 - 145 mmol/L Final  . Potassium 08/26/2018 4.5  3.5 - 5.1 mmol/L Final  . Chloride 08/26/2018 106  98 - 111 mmol/L Final  . CO2 08/26/2018 28  22 - 32 mmol/L Final  . Glucose, Bld 08/26/2018 105* 70 - 99 mg/dL Final  . BUN 08/26/2018 14  8 - 23 mg/dL Final  . Creatinine, Ser 08/26/2018 1.02* 0.44 - 1.00 mg/dL Final  . Calcium 08/26/2018 9.5  8.9 - 10.3 mg/dL Final  . GFR calc non Af Amer 08/26/2018 51* >60 mL/min Final  . GFR calc Af Amer 08/26/2018 59* >60 mL/min Final    Comment: (NOTE) The eGFR has been calculated using the CKD EPI equation. This calculation has not been validated in all clinical situations. eGFR's persistently <60 mL/min signify possible Chronic Kidney Disease.   Georgiann Hahn gap 08/26/2018 10  5 - 15 Final   Performed at [facility], [place] [address], [place], [place] [address]  . WBC 08/26/2018 7.2  4.0 - 10.5 K/uL Final  . RBC 08/26/2018 4.36  3.87 - 5.11 MIL/uL Final  . Hemoglobin 08/26/2018 13.2  12.0 - 15.0 g/dL Final  . HCT 08/26/2018 40.2  36.0 - 46.0 % Final  . MCV 08/26/2018 92.2  78.0 - 100.0 fL Final  . MCH 08/26/2018 30.3  26.0 - 34.0 pg Final  . MCHC 08/26/2018 32.8  30.0 - 36.0 g/dL Final  . RDW 08/26/2018 14.1  11.5 - 15.5 % Final  . Platelets 08/26/2018 312  150 - 400 K/uL Final   Performed at [facility], [place] [address] [place], [place] [address]  . Prothrombin Time 08/26/2018 14.6  11.4 - 15.2 seconds Final  . INR 08/26/2018 1.15   Final   Performed at [facility], [place] [address] [place], [place] [address]  . Color, Urine 08/26/2018 YELLOW  YELLOW Final  . APPearance 08/26/2018 CLEAR  CLEAR Final  . Specific Gravity, Urine 08/26/2018 1.025  1.005 - 1.030 Final  . pH 08/26/2018 6.0  5.0 - 8.0 Final  . Glucose, UA 08/26/2018 NEGATIVE  NEGATIVE mg/dL Final  . Hgb urine dipstick 08/26/2018 NEGATIVE  NEGATIVE Final  . Bilirubin Urine 08/26/2018 NEGATIVE  NEGATIVE Final  . Ketones, ur 08/26/2018 NEGATIVE  NEGATIVE mg/dL Final  . Protein, ur 08/26/2018 TRACE* NEGATIVE mg/dL Final  . Nitrite 08/26/2018 NEGATIVE  NEGATIVE Final  . Leukocytes, UA 08/26/2018 SMALL* NEGATIVE Final   Performed at [place] [address], [place], [place] [address]  . MRSA, PCR 08/26/2018 NEGATIVE  NEGATIVE Final  . Staphylococcus aureus 08/26/2018 NEGATIVE  NEGATIVE Final   Comment: (NOTE) The Xpert SA Assay (FDA approved for NASAL specimens in patients  78 years of age and older), is one component of a comprehensive surveillance program. It is not intended to diagnose infection nor to guide or monitor treatment. Performed at [facility], [place] [address], [place],  [address]   . RBC / HPF 08/26/2018 0-5  0 - 5 RBC/hpf Final  . WBC, UA 08/26/2018 0-5  0 - 5 WBC/hpf Final  . Bacteria, UA 08/26/2018 MANY* NONE SEEN Final  . Squamous Epithelial /  LPF 08/26/2018 0-5  0 - 5 Final   Performed at [facility], [place] [address], [place], [place] [address]     X-Rays:Dg Knee Right Port  Result Date: 09/02/2018 CLINICAL DATA:  Right total knee arthroplasty EXAM: PORTABLE RIGHT KNEE - 1-2 VIEW COMPARISON:  04/27/2018 FINDINGS: Postop change with reference to a new right total knee arthroplasty is identified. Overlying skin staples and soft tissue edema is identified as well as intra-articular and subcutaneous emphysema. No hardware failure nor immediate postoperative complication with reference to the knee arthroplasty. IMPRESSION: New right total knee arthroplasty with postop change. No immediate postoperative complicating features. Electronically Signed   By: Ashley Royalty M.D.   On: 09/02/2018 18:45    EKG: Orders placed or performed in visit on 03/16/18  . EKG 12-Lead     Hospital Course: Amber Wu is a 78 y.o. who was admitted to [facility]. They were brought to the operating room on 09/02/2018 and underwent Procedure(s): RIGHT TOTAL KNEE ARTHROPLASTY.  Patient tolerated the procedure well and was later transferred to the recovery room and then to the orthopaedic floor for postoperative care.  They were given PO and IV analgesics for pain control following their surgery.  They were given 24 hours of postoperative antibiotics of  Anti-infectives (From admission, onward)   Start     Dose/Rate Route Frequency Ordered Stop   09/02/18 2000  ceFAZolin (ANCEF) IVPB 2g/100 mL premix     2 g 200  mL/hr over 30 Minutes Intravenous Every 6 hours 09/02/18 1746 09/03/18 0846   09/02/18 1516  polymyxin B 500,000 Units, bacitracin 50,000 Units in sodium chloride 0.9 % 500 mL irrigation  Status:  Discontinued       As needed 09/02/18 1516 09/02/18 1733   09/02/18 1115  ceFAZolin (ANCEF) IVPB 2g/100 mL premix     2 g 200 mL/hr over 30 Minutes Intravenous On call to O.R. 09/02/18 1107 09/02/18 1425     and started on DVT prophylaxis in the form of TED hose and Pradaxa and SCDs.   PT and OT were ordered for total joint protocol.  Discharge planning consulted to help with postop disposition and equipment needs.  Patient had a fair night on the evening of surgery.  They started to get up OOB with therapy on day one.  Continued to work with therapy into day two. By day three, the patient had progressed with therapy and meeting their goals.  Incision was healing well.  Patient was seen in rounds and was ready to go home.   Diet: Regular diet Activity:WBAT Follow-up:in 10-14 days Disposition - Home with HHPT Discharged Condition: good    Allergies as of 09/04/2018      Reactions   Antihistamines, Chlorpheniramine-type Other (See Comments)   INTENSE SHAKING AND UTI   Antihistamines, Diphenhydramine-type Other (See Comments)   INTENSE SHAKING AND UTI   Antihistamines, Loratadine-type Other (See Comments)   INTENSE SHAKING AND UTI      Medication List    STOP taking these medications   aspirin 81 MG tablet   rosuvastatin 10 MG tablet Commonly known as:  CRESTOR     TAKE these medications   ALLERGY EYE 0.025-0.3 % ophthalmic solution Generic drug:  naphazoline-pheniramine Place 1 drop into both eyes 4 (four) times daily as needed for eye irritation or allergies.   allopurinol 100 MG tablet Commonly known as:  ZYLOPRIM Take 200 mg by mouth daily.   amLODipine 10 MG tablet  Commonly known as:  NORVASC Take 10 mg by mouth every evening.   citalopram 20 MG tablet Commonly known as:   CELEXA Take 1 tablet (20 mg total) by mouth daily.   DEXILANT 60 MG capsule Generic drug:  dexlansoprazole Take 60 mg by mouth every evening.   docusate sodium 100 MG capsule Commonly known as:  COLACE Take 1 capsule (100 mg total) by mouth 2 (two) times daily.   furosemide 40 MG tablet Commonly known as:  LASIX Take 40 mg by mouth every other day. In the morning   levothyroxine 150 MCG tablet Commonly known as:  SYNTHROID, LEVOTHROID Take 150 mcg by mouth daily before breakfast.   METAMUCIL PO Take 1 Package by mouth daily.   multivitamin with minerals Tabs tablet Take 1 tablet by mouth daily. One-A-Day   OSTEO BI-FLEX ADV TRIPLE ST PO Take 2 tablets by mouth daily.   oxyCODONE 5 MG immediate release tablet Commonly known as:  Oxy IR/ROXICODONE Take 1-2 tablets (5-10 mg total) by mouth every 4 (four) hours as needed.   polyethylene glycol packet Commonly known as:  MIRALAX / GLYCOLAX Take 17 g by mouth daily.   PRADAXA 150 MG Caps capsule Generic drug:  dabigatran TAKE 1 CAPSULE TWICE A DAY What changed:  how much to take   TOPROL XL 50 MG 24 hr tablet Generic drug:  metoprolol succinate Take 50 mg by mouth 2 (two) times daily.   Vitamin D-3 1000 units Caps Take 1,000 Units by mouth daily.            Durable Medical Equipment  (From admission, onward)         Start     Ordered   09/03/18 1105  For home use only DME Walker rolling  Once    Question:  Patient needs a walker to treat with the following condition  Answer:  History of orthopedic surgery   09/03/18 1105   09/03/18 1105  For home use only DME 3 n 1  Once     09/03/18 1105         Follow-up Information    Susa Day, MD Follow up in 2 week(s).   Specialty:  Orthopedic Surgery Contact information: [address] [place] [address] [phone]           Signed: Lacie Draft, PA-C Orthopaedic Surgery 09/04/2018, 9:16 AM

## 2018-09-04 NOTE — Progress Notes (Signed)
Physical Therapy Treatment Patient Details Name: Amber Wu MRN: [id number] DOB: 1941-07-11 Today's Date: 09/04/2018    History of Present Illness Pt s/p R TKR and with hx of COPD and a-fib    PT Comments    Pt in good spirits this am and progressing steadily with mobility.   Follow Up Recommendations  Follow surgeon's recommendation for DC plan and follow-up therapies     Equipment Recommendations  Rolling walker with 5" wheels    Recommendations for Other Services       Precautions / Restrictions Precautions Precautions: Knee;Fall Restrictions Weight Bearing Restrictions: No Other Position/Activity Restrictions: WBAT    Mobility  Bed Mobility Overal bed mobility: Needs Assistance Bed Mobility: Supine to Sit     Supine to sit: Supervision     General bed mobility comments: Increased time  Transfers Overall transfer level: Needs assistance Equipment used: Rolling walker (2 wheeled) Transfers: Sit to/from Stand Sit to Stand: Min guard;Supervision         General transfer comment: Cues for LE management and use of UEs to self assist  Ambulation/Gait Ambulation/Gait assistance: Min assist;Min guard Gait Distance (Feet): 140 Feet Assistive device: Rolling walker (2 wheeled) Gait Pattern/deviations: Step-to pattern;Step-through pattern;Decreased step length - right;Decreased step length - left;Shuffle;Trendelenburg Gait velocity: decr   General Gait Details: cues for posture, position from RW and initial sequence   Stairs             Wheelchair Mobility    Modified Rankin (Stroke Patients Only)       Balance Overall balance assessment: Mild deficits observed, not formally tested                                          Cognition Arousal/Alertness: Awake/alert Behavior During Therapy: WFL for tasks assessed/performed Overall Cognitive Status: Within Functional Limits for tasks assessed                                        Exercises Total Joint Exercises Ankle Circles/Pumps: AROM;20 reps;Supine;Both Quad Sets: AROM;Both;Supine;15 reps Heel Slides: AAROM;Right;Supine;20 reps Straight Leg Raises: AAROM;AROM;Right;Supine;20 reps Goniometric ROM: AAROM R knee -8- 85    General Comments        Pertinent Vitals/Pain Pain Assessment: 0-10 Pain Score: 4  Pain Location: R knee Pain Descriptors / Indicators: Aching;Sore Pain Intervention(s): Limited activity within patient's tolerance;Monitored during session;Premedicated before session;Ice applied    Home Living                      Prior Function            PT Goals (current goals can now be found in the care plan section) Acute Rehab PT Goals Patient Stated Goal: Regain IND PT Goal Formulation: With patient Time For Goal Achievement: 09/10/18 Potential to Achieve Goals: Good Progress towards PT goals: Progressing toward goals    Frequency    7X/week      PT Plan Current plan remains appropriate    Co-evaluation              AM-PAC PT "6 Clicks" Daily Activity  Outcome Measure  Difficulty turning over in bed (including adjusting bedclothes, sheets and blankets)?: A Lot Difficulty moving from lying on back to sitting on the side of the  bed? : A Lot Difficulty sitting down on and standing up from a chair with arms (e.g., wheelchair, bedside commode, etc,.)?: A Lot Help needed moving to and from a bed to chair (including a wheelchair)?: A Little Help needed walking in hospital room?: A Little Help needed climbing 3-5 steps with a railing? : A Little 6 Click Score: 15    End of Session Equipment Utilized During Treatment: Gait belt Activity Tolerance: Patient tolerated treatment well Patient left: with call bell/phone within reach;with family/visitor present;in chair Nurse Communication: Mobility status PT Visit Diagnosis: Difficulty in walking, not elsewhere classified (R26.2)     Time:  2376-2831 PT Time Calculation (min) (ACUTE ONLY): 28 min  Charges:  $Gait Training: 8-22 mins $Therapeutic Exercise: 8-22 mins                     [place] Pager [phone] Office [phone]    Bear Osten 09/04/2018, 3:10 PM

## 2018-09-04 NOTE — Care Management Note (Signed)
Case Management Note  Patient Details  Name: Amber Wu MRN: [id number] Date of Birth: 08-Mar-1941  Subjective/Objective:   Discharge planning, spoke with patient and spouse at bedside. Have chosen [facility] for [facility] PT, evaluate and treat.   Action/Plan: Has DME in room.  Expected Discharge Date:                  Expected Discharge Plan:  [place]  In-House Referral:  NA  Discharge planning Services  CM Consult  Post Acute Care Choice:  Durable Medical Equipment Choice offered to:  Patient  DME Arranged:  3-N-1, Walker rolling DME Agency:  [place]:  PT [place]:  [facility]  Status of Service:  Completed, signed off  If discussed at [employer] of Stay Meetings, dates discussed:    Additional Comments:  Guadalupe Maple, RN 09/04/2018, 3:53 PM

## 2018-09-05 LAB — CBC
HCT: 34.4 % — ABNORMAL LOW (ref 36.0–46.0)
HEMOGLOBIN: 11 g/dL — AB (ref 12.0–15.0)
MCH: 29.7 pg (ref 26.0–34.0)
MCHC: 32 g/dL (ref 30.0–36.0)
MCV: 93 fL (ref 78.0–100.0)
Platelets: 262 10*3/uL (ref 150–400)
RBC: 3.7 MIL/uL — AB (ref 3.87–5.11)
RDW: 14.5 % (ref 11.5–15.5)
WBC: 9.7 10*3/uL (ref 4.0–10.5)

## 2018-09-05 NOTE — Plan of Care (Signed)
Pt alert and oriented, resting with no complaints this morning. RN will monitor.

## 2018-09-05 NOTE — Progress Notes (Signed)
Subjective: 3 Days Post-Op Procedure(s) (LRB): RIGHT TOTAL KNEE ARTHROPLASTY (Right) Patient reports pain as mild.    Objective: Vital signs in last 24 hours: Temp:  [98.2 F (36.8 C)-99.2 F (37.3 C)] 98.2 F (36.8 C) (09/28 0927) Pulse Rate:  [73-96] 75 (09/28 0927) Resp:  [16] 16 (09/28 0554) BP: (120-155)/(58-87) 120/59 (09/28 0927) SpO2:  [91 %-100 %] 92 % (09/28 0927)  Intake/Output from previous day: 09/27 0701 - 09/28 0700 In: 420 [P.O.:420] Out: -  Intake/Output this shift: No intake/output data recorded.  Recent Labs    09/03/18 0458 09/04/18 0510 09/05/18 0351  HGB 11.9* 11.0* 11.0*   Recent Labs    09/04/18 0510 09/05/18 0351  WBC 8.7 9.7  RBC 3.64* 3.70*  HCT 33.6* 34.4*  PLT 280 262   Recent Labs    09/03/18 0458  NA 142  K 3.9  CL 105  CO2 26  BUN 12  CREATININE 1.03*  GLUCOSE 170*  CALCIUM 8.6*   No results for input(s): LABPT, INR in the last 72 hours.  Neurologically intact ABD soft Neurovascular intact Sensation intact distally Intact pulses distally Dorsiflexion/Plantar flexion intact Incision: dressing C/D/I and no drainage No cellulitis present Compartment soft moderate swelling RLE. no calf pain or sign of DVT  Assessment/Plan: 3 Days Post-Op Procedure(s) (LRB): RIGHT TOTAL KNEE ARTHROPLASTY (Right) Advance diet Up with therapy D/C IV fluids Discussed D/C instructions D/C home today with HHPT Follow up outpt in 2 weeks Aggressive ice and elevation at home toes above the nose to reduce swelling  Amber Wu M. 09/05/2018, 10:09 AM

## 2018-09-05 NOTE — Progress Notes (Signed)
Physical Therapy Treatment Patient Details Name: Amber Wu MRN: [id number] DOB: 07-14-1941 Today's Date: 09/05/2018    History of Present Illness Pt s/p R TKR and with hx of COPD and a-fib    PT Comments    Marked improvement from last session but pt continues ltd by fatigue.  Pt ambulated to bathroom and into hall and negotiated stairs.  Will follow up with therex   Follow Up Recommendations  Follow surgeon's recommendation for DC plan and follow-up therapies;Home health PT     Equipment Recommendations  Rolling walker with 5" wheels    Recommendations for Other Services       Precautions / Restrictions Precautions Precautions: Knee;Fall Restrictions Weight Bearing Restrictions: No Other Position/Activity Restrictions: WBAT    Mobility  Bed Mobility Overal bed mobility: Needs Assistance Bed Mobility: Supine to Sit     Supine to sit: Supervision     General bed mobility comments: Increased time  Transfers Overall transfer level: Needs assistance Equipment used: Rolling walker (2 wheeled) Transfers: Sit to/from Stand Sit to Stand: Supervision         General transfer comment: Cues for LE management and use of UEs to self assist  Ambulation/Gait Ambulation/Gait assistance: Min guard;Supervision Gait Distance (Feet): 100 Feet Assistive device: Rolling walker (2 wheeled) Gait Pattern/deviations: Step-to pattern;Step-through pattern;Decreased step length - right;Decreased step length - left;Shuffle;Trendelenburg Gait velocity: decr   General Gait Details: cues for posture, position from RW and sequence.  Pt tolerating min WB on R LE   Stairs Stairs: Yes Stairs assistance: Min assist Stair Management: No rails;Two rails;Step to pattern;Forwards;With walker Number of Stairs: 6 General stair comments: 5 steps fwd with bil rails and single step fwd with RW.  Cues for sequence and foot/RW placement   Wheelchair Mobility    Modified Rankin (Stroke  Patients Only)       Balance Overall balance assessment: Mild deficits observed, not formally tested                                          Cognition Arousal/Alertness: Awake/alert Behavior During Therapy: Impulsive;Anxious Overall Cognitive Status: Within Functional Limits for tasks assessed Area of Impairment: Following commands;Problem solving;Safety/judgement                       Following Commands: Follows one step commands inconsistently Safety/Judgement: Decreased awareness of safety   Problem Solving: Slow processing General Comments: Pt with increased difficulty problem solving and processing commands vs this am      Exercises      General Comments        Pertinent Vitals/Pain Pain Assessment: 0-10 Pain Score: 4  Pain Location: R knee Pain Descriptors / Indicators: Aching;Sore Pain Intervention(s): Limited activity within patient's tolerance;Monitored during session;Premedicated before session;Ice applied    Home Living                      Prior Function            PT Goals (current goals can now be found in the care plan section) Acute Rehab PT Goals Patient Stated Goal: Regain IND PT Goal Formulation: With patient Time For Goal Achievement: 09/10/18 Potential to Achieve Goals: Good Progress towards PT goals: Progressing toward goals    Frequency    7X/week      PT Plan Current plan remains appropriate  Co-evaluation              AM-PAC PT "6 Clicks" Daily Activity  Outcome Measure  Difficulty turning over in bed (including adjusting bedclothes, sheets and blankets)?: A Lot Difficulty moving from lying on back to sitting on the side of the bed? : A Lot Difficulty sitting down on and standing up from a chair with arms (e.g., wheelchair, bedside commode, etc,.)?: A Lot Help needed moving to and from a bed to chair (including a wheelchair)?: A Little Help needed walking in hospital room?: A  Little Help needed climbing 3-5 steps with a railing? : A Little 6 Click Score: 15    End of Session Equipment Utilized During Treatment: Gait belt Activity Tolerance: Patient limited by fatigue;Patient tolerated treatment well Patient left: in chair;with call bell/phone within reach;with chair alarm set Nurse Communication: Mobility status PT Visit Diagnosis: Difficulty in walking, not elsewhere classified (R26.2)     Time: 5638-7564 PT Time Calculation (min) (ACUTE ONLY): 32 min  Charges:  $Gait Training: 8-22 mins $Therapeutic Activity: 8-22 mins                     [place] Pager [phone] Office [phone]    Claudina Oliphant 09/05/2018, 1:39 PM

## 2018-09-05 NOTE — Progress Notes (Signed)
Physical Therapy Treatment Patient Details Name: DEVON KINGDON MRN: [id number] DOB: [birth year] Today's Date: 09/05/2018    History of Present Illness Pt s/p R TKR and with hx of COPD and a-fib    PT Comments    Reviewed home therex with written instruction provided.   Follow Up Recommendations  Follow surgeon's recommendation for DC plan and follow-up therapies;Home health PT     Equipment Recommendations  Rolling walker with 5" wheels    Recommendations for Other Services       Precautions / Restrictions Precautions Precautions: Knee;Fall Restrictions Weight Bearing Restrictions: No Other Position/Activity Restrictions: WBAT    Mobility  Bed Mobility Overal bed mobility: Needs Assistance Bed Mobility: Supine to Sit     Supine to sit: Supervision     General bed mobility comments: Increased time  Transfers Overall transfer level: Needs assistance Equipment used: Rolling walker (2 wheeled) Transfers: Sit to/from Stand Sit to Stand: Supervision         General transfer comment: Cues for LE management and use of UEs to self assist  Ambulation/Gait Ambulation/Gait assistance: Min guard;Supervision Gait Distance (Feet): 100 Feet Assistive device: Rolling walker (2 wheeled) Gait Pattern/deviations: Step-to pattern;Step-through pattern;Decreased step length - right;Decreased step length - left;Shuffle;Trendelenburg Gait velocity: decr   General Gait Details: cues for posture, position from RW and sequence.  Pt tolerating min WB on R LE   Stairs Stairs: Yes Stairs assistance: Min assist Stair Management: No rails;Two rails;Step to pattern;Forwards;With walker Number of Stairs: 6 General stair comments: 5 steps fwd with bil rails and single step fwd with RW.  Cues for sequence and foot/RW placement   Wheelchair Mobility    Modified Rankin (Stroke Patients Only)       Balance Overall balance assessment: Mild deficits observed, not formally tested                                           Cognition Arousal/Alertness: Awake/alert Behavior During Therapy: Impulsive;Anxious Overall Cognitive Status: Within Functional Limits for tasks assessed Area of Impairment: Following commands;Problem solving;Safety/judgement                       Following Commands: Follows one step commands consistently Safety/Judgement: Decreased awareness of safety   Problem Solving: Slow processing General Comments: Pt with increased difficulty problem solving and processing commands vs this am      Exercises Total Joint Exercises Ankle Circles/Pumps: AROM;20 reps;Supine;Both Quad Sets: AROM;Both;Supine;15 reps Heel Slides: AAROM;Right;Supine;20 reps Straight Leg Raises: AAROM;AROM;Right;Supine;20 reps Knee Flexion: AROM;AAROM;Right;10 reps;Seated    General Comments        Pertinent Vitals/Pain Pain Assessment: 0-10 Pain Score: 4  Pain Location: R knee Pain Descriptors / Indicators: Aching;Sore Pain Intervention(s): Limited activity within patient's tolerance;Monitored during session;Premedicated before session;Ice applied    Home Living                      Prior Function            PT Goals (current goals can now be found in the care plan section) Acute Rehab PT Goals Patient Stated Goal: Regain IND PT Goal Formulation: With patient Time For Goal Achievement: 09/10/18 Potential to Achieve Goals: Good Progress towards PT goals: Progressing toward goals    Frequency    7X/week      PT Plan Current plan remains  appropriate    Co-evaluation              AM-PAC PT "6 Clicks" Daily Activity  Outcome Measure  Difficulty turning over in bed (including adjusting bedclothes, sheets and blankets)?: A Lot Difficulty moving from lying on back to sitting on the side of the bed? : A Lot Difficulty sitting down on and standing up from a chair with arms (e.g., wheelchair, bedside commode,  etc,.)?: A Lot Help needed moving to and from a bed to chair (including a wheelchair)?: A Little Help needed walking in hospital room?: A Little Help needed climbing 3-5 steps with a railing? : A Little 6 Click Score: 15    End of Session Equipment Utilized During Treatment: Gait belt Activity Tolerance: Patient limited by fatigue;Patient tolerated treatment well Patient left: in chair;with call bell/phone within reach;with chair alarm set Nurse Communication: Mobility status PT Visit Diagnosis: Difficulty in walking, not elsewhere classified (R26.2)     Time: 1443-1540 PT Time Calculation (min) (ACUTE ONLY): 18 min  Charges:  $Gait Training: 8-22 mins $Therapeutic Exercise: 8-22 mins $Therapeutic Activity: 8-22 mins                     Advance Pager [phone] Office [phone]    Catilyn Boggus 09/05/2018, 1:44 PM

## 2018-09-07 DIAGNOSIS — K2270 Barrett's esophagus without dysplasia: Secondary | ICD-10-CM | POA: Diagnosis not present

## 2018-09-07 DIAGNOSIS — J449 Chronic obstructive pulmonary disease, unspecified: Secondary | ICD-10-CM | POA: Diagnosis not present

## 2018-09-07 DIAGNOSIS — I10 Essential (primary) hypertension: Secondary | ICD-10-CM | POA: Diagnosis not present

## 2018-09-07 DIAGNOSIS — Z471 Aftercare following joint replacement surgery: Secondary | ICD-10-CM | POA: Diagnosis not present

## 2018-09-07 DIAGNOSIS — K3184 Gastroparesis: Secondary | ICD-10-CM | POA: Diagnosis not present

## 2018-09-07 DIAGNOSIS — K222 Esophageal obstruction: Secondary | ICD-10-CM | POA: Diagnosis not present

## 2018-09-07 DIAGNOSIS — I071 Rheumatic tricuspid insufficiency: Secondary | ICD-10-CM | POA: Diagnosis not present

## 2018-09-07 DIAGNOSIS — Z9181 History of falling: Secondary | ICD-10-CM | POA: Diagnosis not present

## 2018-09-07 DIAGNOSIS — K219 Gastro-esophageal reflux disease without esophagitis: Secondary | ICD-10-CM | POA: Diagnosis not present

## 2018-09-07 DIAGNOSIS — I4891 Unspecified atrial fibrillation: Secondary | ICD-10-CM | POA: Diagnosis not present

## 2018-09-07 DIAGNOSIS — Z96651 Presence of right artificial knee joint: Secondary | ICD-10-CM | POA: Diagnosis not present

## 2018-09-07 DIAGNOSIS — Z7901 Long term (current) use of anticoagulants: Secondary | ICD-10-CM | POA: Diagnosis not present

## 2018-09-07 DIAGNOSIS — E039 Hypothyroidism, unspecified: Secondary | ICD-10-CM | POA: Diagnosis not present

## 2018-09-07 DIAGNOSIS — Z87891 Personal history of nicotine dependence: Secondary | ICD-10-CM | POA: Diagnosis not present

## 2018-09-07 DIAGNOSIS — K449 Diaphragmatic hernia without obstruction or gangrene: Secondary | ICD-10-CM | POA: Diagnosis not present

## 2018-09-07 DIAGNOSIS — F329 Major depressive disorder, single episode, unspecified: Secondary | ICD-10-CM | POA: Diagnosis not present

## 2018-09-10 DIAGNOSIS — I4891 Unspecified atrial fibrillation: Secondary | ICD-10-CM | POA: Diagnosis not present

## 2018-09-10 DIAGNOSIS — J449 Chronic obstructive pulmonary disease, unspecified: Secondary | ICD-10-CM | POA: Diagnosis not present

## 2018-09-10 DIAGNOSIS — K2270 Barrett's esophagus without dysplasia: Secondary | ICD-10-CM | POA: Diagnosis not present

## 2018-09-10 DIAGNOSIS — I10 Essential (primary) hypertension: Secondary | ICD-10-CM | POA: Diagnosis not present

## 2018-09-10 DIAGNOSIS — Z471 Aftercare following joint replacement surgery: Secondary | ICD-10-CM | POA: Diagnosis not present

## 2018-09-10 DIAGNOSIS — I071 Rheumatic tricuspid insufficiency: Secondary | ICD-10-CM | POA: Diagnosis not present

## 2018-09-11 DIAGNOSIS — I071 Rheumatic tricuspid insufficiency: Secondary | ICD-10-CM | POA: Diagnosis not present

## 2018-09-11 DIAGNOSIS — Z471 Aftercare following joint replacement surgery: Secondary | ICD-10-CM | POA: Diagnosis not present

## 2018-09-11 DIAGNOSIS — J449 Chronic obstructive pulmonary disease, unspecified: Secondary | ICD-10-CM | POA: Diagnosis not present

## 2018-09-11 DIAGNOSIS — I4891 Unspecified atrial fibrillation: Secondary | ICD-10-CM | POA: Diagnosis not present

## 2018-09-11 DIAGNOSIS — K2270 Barrett's esophagus without dysplasia: Secondary | ICD-10-CM | POA: Diagnosis not present

## 2018-09-11 DIAGNOSIS — I10 Essential (primary) hypertension: Secondary | ICD-10-CM | POA: Diagnosis not present

## 2018-09-14 DIAGNOSIS — K2270 Barrett's esophagus without dysplasia: Secondary | ICD-10-CM | POA: Diagnosis not present

## 2018-09-14 DIAGNOSIS — I071 Rheumatic tricuspid insufficiency: Secondary | ICD-10-CM | POA: Diagnosis not present

## 2018-09-14 DIAGNOSIS — J449 Chronic obstructive pulmonary disease, unspecified: Secondary | ICD-10-CM | POA: Diagnosis not present

## 2018-09-14 DIAGNOSIS — I10 Essential (primary) hypertension: Secondary | ICD-10-CM | POA: Diagnosis not present

## 2018-09-14 DIAGNOSIS — I4891 Unspecified atrial fibrillation: Secondary | ICD-10-CM | POA: Diagnosis not present

## 2018-09-14 DIAGNOSIS — Z471 Aftercare following joint replacement surgery: Secondary | ICD-10-CM | POA: Diagnosis not present

## 2018-09-16 DIAGNOSIS — J449 Chronic obstructive pulmonary disease, unspecified: Secondary | ICD-10-CM | POA: Diagnosis not present

## 2018-09-16 DIAGNOSIS — I10 Essential (primary) hypertension: Secondary | ICD-10-CM | POA: Diagnosis not present

## 2018-09-16 DIAGNOSIS — K2270 Barrett's esophagus without dysplasia: Secondary | ICD-10-CM | POA: Diagnosis not present

## 2018-09-16 DIAGNOSIS — Z471 Aftercare following joint replacement surgery: Secondary | ICD-10-CM | POA: Diagnosis not present

## 2018-09-16 DIAGNOSIS — I071 Rheumatic tricuspid insufficiency: Secondary | ICD-10-CM | POA: Diagnosis not present

## 2018-09-16 DIAGNOSIS — I4891 Unspecified atrial fibrillation: Secondary | ICD-10-CM | POA: Diagnosis not present

## 2018-09-17 DIAGNOSIS — M4310 Spondylolisthesis, site unspecified: Secondary | ICD-10-CM | POA: Diagnosis not present

## 2018-09-17 DIAGNOSIS — M419 Scoliosis, unspecified: Secondary | ICD-10-CM | POA: Diagnosis not present

## 2018-09-17 DIAGNOSIS — M1711 Unilateral primary osteoarthritis, right knee: Secondary | ICD-10-CM | POA: Diagnosis not present

## 2018-09-17 DIAGNOSIS — Z471 Aftercare following joint replacement surgery: Secondary | ICD-10-CM | POA: Diagnosis not present

## 2018-10-14 DIAGNOSIS — E7800 Pure hypercholesterolemia, unspecified: Secondary | ICD-10-CM | POA: Diagnosis not present

## 2018-10-14 DIAGNOSIS — M810 Age-related osteoporosis without current pathological fracture: Secondary | ICD-10-CM | POA: Diagnosis not present

## 2018-10-14 DIAGNOSIS — E039 Hypothyroidism, unspecified: Secondary | ICD-10-CM | POA: Diagnosis not present

## 2018-10-14 DIAGNOSIS — R5383 Other fatigue: Secondary | ICD-10-CM | POA: Diagnosis not present

## 2018-10-14 DIAGNOSIS — I480 Paroxysmal atrial fibrillation: Secondary | ICD-10-CM | POA: Diagnosis not present

## 2018-10-14 DIAGNOSIS — I10 Essential (primary) hypertension: Secondary | ICD-10-CM | POA: Diagnosis not present

## 2018-10-19 DIAGNOSIS — I10 Essential (primary) hypertension: Secondary | ICD-10-CM | POA: Diagnosis not present

## 2018-10-19 DIAGNOSIS — E032 Hypothyroidism due to medicaments and other exogenous substances: Secondary | ICD-10-CM | POA: Diagnosis not present

## 2018-12-10 DIAGNOSIS — E039 Hypothyroidism, unspecified: Secondary | ICD-10-CM | POA: Diagnosis not present

## 2018-12-10 DIAGNOSIS — M810 Age-related osteoporosis without current pathological fracture: Secondary | ICD-10-CM | POA: Diagnosis not present

## 2018-12-10 DIAGNOSIS — R5383 Other fatigue: Secondary | ICD-10-CM | POA: Diagnosis not present

## 2018-12-10 DIAGNOSIS — Z79899 Other long term (current) drug therapy: Secondary | ICD-10-CM | POA: Diagnosis not present

## 2018-12-16 DIAGNOSIS — R5383 Other fatigue: Secondary | ICD-10-CM | POA: Diagnosis not present

## 2018-12-16 DIAGNOSIS — I480 Paroxysmal atrial fibrillation: Secondary | ICD-10-CM | POA: Diagnosis not present

## 2018-12-16 DIAGNOSIS — I10 Essential (primary) hypertension: Secondary | ICD-10-CM | POA: Diagnosis not present

## 2018-12-16 DIAGNOSIS — E039 Hypothyroidism, unspecified: Secondary | ICD-10-CM | POA: Diagnosis not present

## 2018-12-16 DIAGNOSIS — E7800 Pure hypercholesterolemia, unspecified: Secondary | ICD-10-CM | POA: Diagnosis not present

## 2018-12-16 DIAGNOSIS — M810 Age-related osteoporosis without current pathological fracture: Secondary | ICD-10-CM | POA: Diagnosis not present

## 2019-02-03 DIAGNOSIS — I10 Essential (primary) hypertension: Secondary | ICD-10-CM | POA: Diagnosis not present

## 2019-02-03 DIAGNOSIS — M810 Age-related osteoporosis without current pathological fracture: Secondary | ICD-10-CM | POA: Diagnosis not present

## 2019-02-03 DIAGNOSIS — I480 Paroxysmal atrial fibrillation: Secondary | ICD-10-CM | POA: Diagnosis not present

## 2019-02-03 DIAGNOSIS — E039 Hypothyroidism, unspecified: Secondary | ICD-10-CM | POA: Diagnosis not present

## 2019-02-03 DIAGNOSIS — E7800 Pure hypercholesterolemia, unspecified: Secondary | ICD-10-CM | POA: Diagnosis not present

## 2019-02-03 DIAGNOSIS — R5383 Other fatigue: Secondary | ICD-10-CM | POA: Diagnosis not present

## 2019-03-17 ENCOUNTER — Telehealth: Payer: Self-pay

## 2019-03-17 NOTE — Telephone Encounter (Signed)
   Called pt to changed appointment to virtual visit. Pt state she is feeling well and would prefer to reschedule.  Cardiac Questionnaire:    Since your last visit or hospitalization:    1. Have you been having new or worsening chest pain? No   2. Have you been having new or worsening shortness of breath? No 3. Have you been having new or worsening leg swelling, wt gain, or increase in abdominal girth (pants fitting more tightly)? No   4. Have you had any passing out spells? No      Primary Cardiologist:  Pixie Casino, MD   Patient contacted.  History reviewed.  No symptoms to suggest any unstable cardiac conditions.  Based on discussion, with current pandemic situation, we will be postponing this appointment for Amber Wu with a plan for f/u in 6 mo or sooner if feasible/necessary.  If symptoms change, she has been instructed to contact our office.   Routing to C19 CANCEL pool for tracking (P CV DIV CV19 CANCEL - reason for visit "other.") and assigning priority (1 = 4-6 wks, 2 = 6-12 wks, 3 = >12 wks).   Meryl Crutch, RN  03/17/2019 10:04 AM         .

## 2019-03-23 ENCOUNTER — Ambulatory Visit: Payer: Medicare Other | Admitting: Internal Medicine

## 2019-04-02 NOTE — Telephone Encounter (Signed)
   Called pt to reschedule.  She is doing well, no acute problems.  She does not have a smart phone, prefers not to do virtual appts.  Scheduled her for July 27, 2:00 pm with Dr Debara Pickett.  Pt wrote down info and was grateful for the call.  Rosaria Ferries, PA-C 04/02/2019 2:53 PM Beeper [phone]

## 2019-06-09 DIAGNOSIS — Z471 Aftercare following joint replacement surgery: Secondary | ICD-10-CM | POA: Diagnosis not present

## 2019-06-09 DIAGNOSIS — Z96651 Presence of right artificial knee joint: Secondary | ICD-10-CM | POA: Diagnosis not present

## 2019-06-14 DIAGNOSIS — H04211 Epiphora due to excess lacrimation, right lacrimal gland: Secondary | ICD-10-CM | POA: Diagnosis not present

## 2019-06-14 DIAGNOSIS — H04123 Dry eye syndrome of bilateral lacrimal glands: Secondary | ICD-10-CM | POA: Diagnosis not present

## 2019-06-14 DIAGNOSIS — Z961 Presence of intraocular lens: Secondary | ICD-10-CM | POA: Diagnosis not present

## 2019-06-14 DIAGNOSIS — H04213 Epiphora due to excess lacrimation, bilateral lacrimal glands: Secondary | ICD-10-CM | POA: Diagnosis not present

## 2019-06-14 DIAGNOSIS — H04212 Epiphora due to excess lacrimation, left lacrimal gland: Secondary | ICD-10-CM | POA: Diagnosis not present

## 2019-06-14 DIAGNOSIS — H35033 Hypertensive retinopathy, bilateral: Secondary | ICD-10-CM | POA: Diagnosis not present

## 2019-07-02 ENCOUNTER — Telehealth: Payer: Self-pay | Admitting: Internal Medicine

## 2019-07-02 NOTE — Telephone Encounter (Signed)

## 2019-07-05 ENCOUNTER — Ambulatory Visit (INDEPENDENT_AMBULATORY_CARE_PROVIDER_SITE_OTHER): Payer: Medicare Other | Admitting: Internal Medicine

## 2019-07-05 ENCOUNTER — Other Ambulatory Visit: Payer: Self-pay

## 2019-07-05 ENCOUNTER — Encounter: Payer: Self-pay | Admitting: Internal Medicine

## 2019-07-05 VITALS — BP 149/77 | HR 67 | Ht 64.0 in | Wt 221.0 lb

## 2019-07-05 DIAGNOSIS — Z6841 Body Mass Index (BMI) 40.0 and over, adult: Secondary | ICD-10-CM

## 2019-07-05 DIAGNOSIS — I4819 Other persistent atrial fibrillation: Secondary | ICD-10-CM

## 2019-07-05 DIAGNOSIS — R498 Other voice and resonance disorders: Secondary | ICD-10-CM

## 2019-07-05 NOTE — Progress Notes (Signed)
OFFICE NOTE  Chief Complaint:  Routine follow-up  Primary Care Physician: Amber Gravel, MD  HPI:  Amber Wu is a 78 year old female who has a history of COPD, morbid obesity, gout, hypertension and multiple other problems, including esophageal stricture and numerous dilatations. She was found to be in A-fib and you appropriately started her on Pradaxa as well as a beta blocker. 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 She says her handwriting is now more readable. She seems to be tolerating the Pradaxa without any adverse bleeding complications. She has been on that for now 1 month and therefore should be at low risk of having an intracardiac thrombus. Unfortunately, her heart remains in A-fib with a better controlled ventricular response at 99. She is only mildly aware of this A-fib. We discussed pros and cons of A-fib. However, given the fact that this is fairly new onset, I think she deserves an opportunity to try to get back to sinus rhythm. We therefore will pursue an elective DC cardioversion in the next week or so. I will go ahead and keep you informed of the progress of that. Finally, we did an echocardiogram. Her LV systolic function and diastolic function appear to be preserved; however, the left atrium is moderately dilated with a volume of 34 mL/meter squared and otherwise no significant valvular disease. After her last visit she underwent an elective cardioversion, which was unsuccessful at restoring sinus rhythm. Therefore we have elected to manage her with rate control and anticoagulation. Today her main complaint is shortness of breath and fatigue which is not much different than it has been in the past.   She seems to be tolerating her Pradaxa. She's had no bleeding complaints with this. Her atrial fibrillation is rate controlled. She did report  an episode over the holidays where she had significant lower extremity swelling. She was in [place] and contemplated going to the emergency room ultimately took some of her friend's Lasix. This did improve her swelling and it has since not come back. She denies eating too much salt, but when questioned about this she said that salt is good for her fact it was recommended because of her thyroid problems? Which did not make much sense to me.  Amber Wu returns today for followup. She feels easily she's been having progressive shortness of breath. She does not think this is attributed to her weight of because it is been stable. She also had noted that she is unable to do most activities. She gets progressively weak and fatigued doing simple activities such as grocery shopping. She noted the other day that she became profusely diuretic when walking through [employer] which was unusual for her. Despite this, she does not report any chest pain.  Some still back in the office today. Again she is complaining of shortness of breath with exertion. Her stress test was performed in the fall which was negative for ischemia. EF was not reported due to lack of gating. She's not had an echo in several years. She has a notable tremor and a significant vocal tremor. 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 I wonder if that's playing a role in her upper airway and causing her to be short of breath. Could also be some degree of diastolic or systolic dysfunction. Finally, she is reported a mass in her  left neck. She's felt swelling has been getting worse over the past several months. She is scheduled to see her primary care provider in about a month.  Amber Wu returns today for follow-up. In the interim she is seen Dr. Melvyn Wu with pulmonary who does not feel she has COPD. He is concerned about upper airway pathology as MI with vocal tremor and change in her symptoms. This could be worsening her shortness of  breath. She also reports profound weakness and and recently has been having hallucinations. I wonder if there is an underlying neurologic responsible for her symptoms. She did have an echo which shows normal systolic function. She may have a degree of diastolic dysfunction and recently blood pressure has been elevated. She reports some more leg swelling.  I saw Amber Wu back in the office today in follow-up. She's had a very eventful several months. She recently fell and fractured her right humerus. She is underwent rehabilitation for that seems to be doing much better. When I last saw her she was having significant complaints of shortness of breath and trouble with her voice. I referred her to neurology who felt that this was likely a vocal tremor. She said at the time of that appointment that it was not that bothersome however indicated was much more significant to me. She still has some significant shortness of breath and has fortunately managed to lose a few pounds which I think will be helpful. She feels a lot of her symptoms may be due to anxiety and has had some improvement with Valium. She wants to talk to her primary care provider about some long-term treatments for anxiety. She does take Celexa 20 mg daily which she's been on for some time.  Amber Wu returns today for follow-up. Overall she is doing very well. She's had some improvement in her tremor on beta blocker. She's interested in a refill on her Celexa today. She says her primary care provider for some reason will not provide this for her. She says that she's done better on this medicine with regards to anxiety. She appears to have good cholesterol control on Crestor. Unfortunately weight is been stable. She will need to get that lower. Her blood pressure is at goal. No bleeding problems on Pradaxa.  11/22/2016  Amber Wu was seen today in follow-up. Overall she is doing well. No new problems since we last saw her. No bleeding problems. Heart  rate is well-controlled with A. fib. She continues to have vocal tremor. She plans to see a specialist for this. Weight is pretty stable but of encouraged her to continue working on getting the weight down.  03/16/2018  Amber Wu returns today for follow-up.  Her main complaints today have to do with tremor and progressive weakness.  She also is reporting back pain.  She is followed by Dr. Krista Blue in neurology as well as Dr. Brett Fairy (mostly for sleep apnea) -she has a fitting for CPAP coming up.  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  Recently she has had labile hypertension and was noted to be hypotensive in the neurologist office.  She had an episode at home where she had extreme weakness, felt presyncopal and was diaphoretic.  I suspect her blood pressure was low although was not checked.  Today her blood pressure is actually high at 154/79.  She has had about 12 pound weight loss since I last saw her.  Labs from November 2018 showed total cholesterol 153,  HDL 61, LDL 62 and triglycerides 152, hemoglobin A1c of 5.8.   07/05/2019  Amber Wu is seen today in follow-up.  Is now been over a year since I last saw her.  She reports doing really well.  Just prior to the COVID pandemic, she received a total right knee replacement.  Since then she has been doing much better.  She is much more active and has lost more than 10 pounds.  Her blood pressure generally well controlled at home around 120 over 70s.  Cholesterol is also been excellent.  She is had less tremor and is been working with a new endocrinologist who felt that she was apparently overtreated with thyroid replacement.  Her levothyroxine dose had been decreased from 150 to 30 mcg which is significant.  Hopefully this will help her tremor somewhat.  PMHx:  Past Medical History:  Diagnosis Date  . Allergy   . Arthritis   . Atrial fibrillation ([place])    2D ECHO, 08/12/2012 - EF >55%, LA moderately dilated, RA  moderately dilated, moderate tricuspid valve regurgitation  . Barrett's esophagus   . COPD (chronic obstructive pulmonary disease) ([place])   . Depression   . Dysphonia   . Gastroparesis   . GERD (gastroesophageal reflux disease)   . Hiatal hernia   . History of esophageal stricture   . Hypertension   . Hypothyroidism   . Shortness of breath dyspnea     Past Surgical History:  Procedure Laterality Date  . ABDOMINAL HYSTERECTOMY  1970  . CARDIOVERSION  09/14/2012   Procedure: CARDIOVERSION;  Surgeon: Pixie Casino, MD;  Location[facility] END[facility]COPY;  Service: Cardiovascular;  Laterality: N/A;  . CATARACT EXTRACTION Bilateral 06/2014-08/2014  . COLONOSCOPY    . EYE SURGERY    . FACIAL COSMETIC SURGERY  2000  . KNEE SURGERY Right   . ORIF HUMERUS FRACTURE Right 07/06/2015   Procedure: OPEN REDUCTION INTERNAL FIXATION (ORIF) RIGHT PROXIMAL HUMERUS FRACTURE;  Surgeon: Justice Britain, MD;  Location: [facility];  Service: Orthopedics;  Laterality: Right;  . TOTAL KNEE ARTHROPLASTY Right 09/02/2018   Procedure: RIGHT TOTAL KNEE ARTHROPLASTY;  Surgeon: Susa Day, MD;  Location: [facility] ORS;  Service: Orthopedics;  Laterality: Right;  2 hrs    FAMHx:  Family History  Problem Relation Age of Onset  . Stroke Mother   . Heart disease Father   . Colon cancer Neg Hx     SOCHx:   reports that she quit smoking about 25 years ago. Her smoking use included cigarettes. She has a 7.50 pack-year smoking history. She has never used smokeless tobacco. She reports that she does not drink alcohol or use drugs.  ALLERGIES:  Allergies  Allergen Reactions  . Antihistamines, Chlorpheniramine-Type Other (See Comments)    INTENSE SHAKING AND UTI  . Antihistamines, Diphenhydramine-Type Other (See Comments)    INTENSE SHAKING AND UTI  . Antihistamines, Loratadine-Type Other (See Comments)    INTENSE SHAKING AND UTI    ROS: Pertinent items noted in HPI and remainder of comprehensive ROS otherwise negative.  HOME  MEDS: Current Outpatient Medications  Medication Sig Dispense Refill  . allopurinol (ZYLOPRIM) 100 MG tablet Take 200 mg by mouth daily.     Marland Kitchen amLODipine (NORVASC) 10 MG tablet Take 10 mg by mouth every evening.   5  . Cholecalciferol (VITAMIN D-3) 1000 UNITS CAPS Take 1,000 Units by mouth daily.    . citalopram (CELEXA) 20 MG tablet Take 1 tablet (20 mg total) by mouth daily. 30 tablet  0  . dexlansoprazole (DEXILANT) 60 MG capsule Take 60 mg by mouth every evening.     . docusate sodium (COLACE) 100 MG capsule Take 1 capsule (100 mg total) by mouth 2 (two) times daily. 60 capsule 2  . furosemide (LASIX) 40 MG tablet Take 40 mg by mouth every other day. In the morning    . levothyroxine (SYNTHROID, LEVOTHROID) 150 MCG tablet Take 150 mcg by mouth daily before breakfast.    . Misc Natural Products (OSTEO BI-FLEX ADV TRIPLE ST PO) Take 2 tablets by mouth daily.    . Multiple Vitamin (MULTIVITAMIN WITH MINERALS) TABS tablet Take 1 tablet by mouth daily. One-A-Day    . naphazoline-pheniramine (ALLERGY EYE) 0.025-0.3 % ophthalmic solution Place 1 drop into both eyes 4 (four) times daily as needed for eye irritation or allergies.    Marland Kitchen oxyCODONE (ROXICODONE) 5 MG immediate release tablet Take 1-2 tablets (5-10 mg total) by mouth every 4 (four) hours as needed. 40 tablet 0  . polyethylene glycol (MIRALAX) packet Take 17 g by mouth daily. 14 each 0  . PRADAXA 150 MG CAPS capsule TAKE 1 CAPSULE TWICE A DAY (Patient taking differently: Take 150 mg by mouth 2 (two) times daily. ) 180 capsule 1  . Psyllium (METAMUCIL PO) Take 1 Package by mouth daily.    . TOPROL XL 50 MG 24 hr tablet Take 50 mg by mouth 2 (two) times daily.      No current facility-administered medications for this visit.    Facility-Administered Medications Ordered in Other Visits  Medication Dose Route Frequency Provider Last Rate Last Dose  . sodium chloride 0.9 % injection 3 mL  3 mL Intravenous PRN , Nadean Corwin, MD         LABS/IMAGING: No results found for this or any previous visit (from the past 48 hour(s)). No results found.  VITALS: BP (!) 149/77   Pulse 67   Ht 5\' 4"  (1.626 m)   Wt 221 lb (100.2 kg)   LMP  (LMP Unknown)   BMI 37.93 kg/m   EXAM: General appearance: alert, no distress and moderately obese Neck: no carotid bruit, no JVD and thyroid not enlarged, symmetric, no tenderness/mass/nodules Lungs: clear to auscultation bilaterally Heart: irregularly irregular rhythm Abdomen: soft, non-tender; bowel sounds normal; no masses,  no organomegaly and obese Extremities: extremities normal, atraumatic, no cyanosis or edema Pulses: 2+ and symmetric Skin: Skin color, texture, turgor normal. No rashes or lesions Neurologic: Grossly normal Psych: Pleasant  EKG: Atrial fibrillation at 67, nonspecific T wave changes-personally reviewed  ASSESSMENT: 1. Permanent atrial fibrillation on Pradaxa 150 mg twice daily  2. COPD with shortness of breath 3. Hypertension 4. Dyslipidemia 5. Morbid obesity 6. History of esophageal stricture and numerous dilatations 7. Moderately dilated left atrium 8. History of esophageal stricture 9. Vocal tremor 10. Weakness/visual hallucinations 11. Hypothyroidism  PLAN: 1.   Ms. Wu is doing well with recent weight loss.  Her A. fib is permanent and rate controlled.  She denies any bleeding problems on Pradaxa.  She has been fitted with CPAP.  Her blood pressure is well controlled at home.  Her cholesterol is at goal.  She has had recent weight loss.  In addition she has hypothyroidism and after changes in her thyroid medication her tremor has improved somewhat.  Follow-up with me annually or sooner as necessary.  Pixie Casino, MD, [facility], [place] Director of the Advanced Lipid Disorders &  Cardiovascular Risk Reduction Clinic Diplomate of the American Board of Clinical Lipidology Attending Cardiologist  Direct Dial:  [phone]  Fax: [phone]  Website:  [email or web address]   Nadean Corwin  07/05/2019, 2:11 PM

## 2019-07-05 NOTE — Patient Instructions (Signed)
Medication Instructions:  Your physician recommends that you continue on your current medications as directed. Please refer to the Current Medication list given to you today.  If you need a refill on your cardiac medications before your next appointment, please call your pharmacy.   Lab work: None ordered If you have labs (blood work) drawn today and your tests are completely normal, you will receive your results only by: [place] (if you have MyChart) OR A paper copy in the mail If you have any lab test that is abnormal or we need to change your treatment, we will call you to review the results.  Testing/Procedures: None ordered  Follow-Up: At [facility], you and your health needs are our priority.  As part of our continuing mission to provide you with exceptional heart care, we have created designated Provider Care Teams.  These Care Teams include your primary Cardiologist (physician) and Advanced Practice Providers (APPs -  Physician Assistants and Nurse Practitioners) who all work together to provide you with the care you need, when you need it. You will need a follow up appointment in 12 months.  Please call our office 2 months in advance to schedule this appointment.  You may see Pixie Casino, MD or one of the following Advanced Practice Providers on your designated Care Team: Almyra Deforest, PA-C Fabian Sharp, [place]

## 2019-07-14 DIAGNOSIS — E538 Deficiency of other specified B group vitamins: Secondary | ICD-10-CM | POA: Diagnosis not present

## 2019-07-14 DIAGNOSIS — E039 Hypothyroidism, unspecified: Secondary | ICD-10-CM | POA: Diagnosis not present

## 2019-07-14 DIAGNOSIS — I10 Essential (primary) hypertension: Secondary | ICD-10-CM | POA: Diagnosis not present

## 2019-07-14 DIAGNOSIS — E7800 Pure hypercholesterolemia, unspecified: Secondary | ICD-10-CM | POA: Diagnosis not present

## 2019-07-14 DIAGNOSIS — E559 Vitamin D deficiency, unspecified: Secondary | ICD-10-CM | POA: Diagnosis not present

## 2019-07-20 DIAGNOSIS — Z0000 Encounter for general adult medical examination without abnormal findings: Secondary | ICD-10-CM | POA: Diagnosis not present

## 2019-07-20 DIAGNOSIS — E559 Vitamin D deficiency, unspecified: Secondary | ICD-10-CM | POA: Diagnosis not present

## 2019-07-20 DIAGNOSIS — E039 Hypothyroidism, unspecified: Secondary | ICD-10-CM | POA: Diagnosis not present

## 2019-07-20 DIAGNOSIS — E7800 Pure hypercholesterolemia, unspecified: Secondary | ICD-10-CM | POA: Diagnosis not present

## 2019-07-20 DIAGNOSIS — I10 Essential (primary) hypertension: Secondary | ICD-10-CM | POA: Diagnosis not present

## 2019-07-20 DIAGNOSIS — R7301 Impaired fasting glucose: Secondary | ICD-10-CM | POA: Diagnosis not present

## 2019-07-20 DIAGNOSIS — I480 Paroxysmal atrial fibrillation: Secondary | ICD-10-CM | POA: Diagnosis not present

## 2019-10-20 DIAGNOSIS — I10 Essential (primary) hypertension: Secondary | ICD-10-CM | POA: Diagnosis not present

## 2019-10-20 DIAGNOSIS — E7800 Pure hypercholesterolemia, unspecified: Secondary | ICD-10-CM | POA: Diagnosis not present

## 2019-10-20 DIAGNOSIS — R7301 Impaired fasting glucose: Secondary | ICD-10-CM | POA: Diagnosis not present

## 2019-10-25 DIAGNOSIS — I480 Paroxysmal atrial fibrillation: Secondary | ICD-10-CM | POA: Diagnosis not present

## 2019-10-25 DIAGNOSIS — E7800 Pure hypercholesterolemia, unspecified: Secondary | ICD-10-CM | POA: Diagnosis not present

## 2019-10-25 DIAGNOSIS — I10 Essential (primary) hypertension: Secondary | ICD-10-CM | POA: Diagnosis not present

## 2019-10-25 DIAGNOSIS — M25551 Pain in right hip: Secondary | ICD-10-CM | POA: Diagnosis not present

## 2019-10-25 DIAGNOSIS — E039 Hypothyroidism, unspecified: Secondary | ICD-10-CM | POA: Diagnosis not present

## 2019-10-25 DIAGNOSIS — M545 Low back pain: Secondary | ICD-10-CM | POA: Diagnosis not present

## 2019-11-09 DIAGNOSIS — M545 Low back pain: Secondary | ICD-10-CM | POA: Diagnosis not present

## 2019-11-09 DIAGNOSIS — M25551 Pain in right hip: Secondary | ICD-10-CM | POA: Diagnosis not present

## 2019-11-09 DIAGNOSIS — M25561 Pain in right knee: Secondary | ICD-10-CM | POA: Diagnosis not present

## 2020-02-01 DIAGNOSIS — M79671 Pain in right foot: Secondary | ICD-10-CM | POA: Diagnosis not present

## 2020-02-15 DIAGNOSIS — E039 Hypothyroidism, unspecified: Secondary | ICD-10-CM | POA: Diagnosis not present

## 2020-02-15 DIAGNOSIS — I10 Essential (primary) hypertension: Secondary | ICD-10-CM | POA: Diagnosis not present

## 2020-02-28 ENCOUNTER — Other Ambulatory Visit (HOSPITAL_COMMUNITY): Payer: Self-pay | Admitting: Internal Medicine

## 2020-02-28 DIAGNOSIS — R52 Pain, unspecified: Secondary | ICD-10-CM

## 2020-02-28 DIAGNOSIS — E7800 Pure hypercholesterolemia, unspecified: Secondary | ICD-10-CM | POA: Diagnosis not present

## 2020-02-28 DIAGNOSIS — I10 Essential (primary) hypertension: Secondary | ICD-10-CM | POA: Diagnosis not present

## 2020-02-28 DIAGNOSIS — M545 Low back pain: Secondary | ICD-10-CM | POA: Diagnosis not present

## 2020-02-28 DIAGNOSIS — M25551 Pain in right hip: Secondary | ICD-10-CM | POA: Diagnosis not present

## 2020-02-28 DIAGNOSIS — R600 Localized edema: Secondary | ICD-10-CM | POA: Diagnosis not present

## 2020-02-28 DIAGNOSIS — I480 Paroxysmal atrial fibrillation: Secondary | ICD-10-CM | POA: Diagnosis not present

## 2020-02-28 DIAGNOSIS — E039 Hypothyroidism, unspecified: Secondary | ICD-10-CM | POA: Diagnosis not present

## 2020-02-28 DIAGNOSIS — K219 Gastro-esophageal reflux disease without esophagitis: Secondary | ICD-10-CM | POA: Diagnosis not present

## 2020-02-29 ENCOUNTER — Ambulatory Visit (HOSPITAL_COMMUNITY)
Admission: RE | Admit: 2020-02-29 | Discharge: 2020-02-29 | Disposition: A | Discharge status: Home / Self Care | Payer: Medicare Other | Source: Ambulatory Visit | Attending: Internal Medicine | Admitting: Internal Medicine

## 2020-02-29 ENCOUNTER — Other Ambulatory Visit: Payer: Self-pay

## 2020-02-29 DIAGNOSIS — R52 Pain, unspecified: Secondary | ICD-10-CM | POA: Diagnosis not present

## 2020-02-29 NOTE — Progress Notes (Signed)
Bilateral lower extremity venous duplex has been completed. Preliminary results can be found in CV Proc through chart review.  Results were given to A Rosie Place at Dr. Julianne Rice office.  02/29/20 9:14 AM Carlos Levering RVT

## 2020-03-03 DIAGNOSIS — R600 Localized edema: Secondary | ICD-10-CM | POA: Diagnosis not present

## 2020-03-29 ENCOUNTER — Ambulatory Visit (INDEPENDENT_AMBULATORY_CARE_PROVIDER_SITE_OTHER): Payer: Medicare Other | Admitting: Internal Medicine

## 2020-03-29 ENCOUNTER — Other Ambulatory Visit: Payer: Self-pay

## 2020-03-29 ENCOUNTER — Encounter: Payer: Self-pay | Admitting: Internal Medicine

## 2020-03-29 VITALS — BP 114/82 | HR 93 | Ht 64.0 in | Wt 218.4 lb

## 2020-03-29 DIAGNOSIS — I10 Essential (primary) hypertension: Secondary | ICD-10-CM | POA: Diagnosis not present

## 2020-03-29 DIAGNOSIS — Z6841 Body Mass Index (BMI) 40.0 and over, adult: Secondary | ICD-10-CM

## 2020-03-29 DIAGNOSIS — I4819 Other persistent atrial fibrillation: Secondary | ICD-10-CM | POA: Diagnosis not present

## 2020-03-29 DIAGNOSIS — E782 Mixed hyperlipidemia: Secondary | ICD-10-CM | POA: Diagnosis not present

## 2020-03-29 DIAGNOSIS — R498 Other voice and resonance disorders: Secondary | ICD-10-CM

## 2020-03-29 NOTE — Progress Notes (Signed)
OFFICE NOTE  Chief Complaint:  Routine follow-up  Primary Care Physician: Jani Gravel, MD  HPI:  Amber Wu is a 79 year old female who has a history of COPD, morbid obesity, gout, hypertension and multiple other problems, including esophageal stricture and numerous dilatations. She was found to be in A-fib and you appropriately started her on Pradaxa as well as a beta blocker. 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 She says her handwriting is now more readable. She seems to be tolerating the Pradaxa without any adverse bleeding complications. She has been on that for now 1 month and therefore should be at low risk of having an intracardiac thrombus. Unfortunately, her heart remains in A-fib with a better controlled ventricular response at 99. She is only mildly aware of this A-fib. We discussed pros and cons of A-fib. However, given the fact that this is fairly new onset, I think she deserves an opportunity to try to get back to sinus rhythm. We therefore will pursue an elective DC cardioversion in the next week or so. I will go ahead and keep you informed of the progress of that. Finally, we did an echocardiogram. Her LV systolic function and diastolic function appear to be preserved; however, the left atrium is moderately dilated with a volume of 34 mL/meter squared and otherwise no significant valvular disease. After her last visit she underwent an elective cardioversion, which was unsuccessful at restoring sinus rhythm. Therefore we have elected to manage her with rate control and anticoagulation. Today her main complaint is shortness of breath and fatigue which is not much different than it has been in the past.   She seems to be tolerating her Pradaxa. She's had no bleeding complaints with this. Her atrial fibrillation is rate controlled. She did report  an episode over the holidays where she had significant lower extremity swelling. She was in [place] and contemplated going to the emergency room ultimately took some of her friend's Lasix. This did improve her swelling and it has since not come back. She denies eating too much salt, but when questioned about this she said that salt is good for her fact it was recommended because of her thyroid problems? Which did not make much sense to me.  Amber Wu returns today for followup. She feels easily she's been having progressive shortness of breath. She does not think this is attributed to her weight of because it is been stable. She also had noted that she is unable to do most activities. She gets progressively weak and fatigued doing simple activities such as grocery shopping. She noted the other day that she became profusely diuretic when walking through [employer] which was unusual for her. Despite this, she does not report any chest pain.  Some still back in the office today. Again she is complaining of shortness of breath with exertion. Her stress test was performed in the fall which was negative for ischemia. EF was not reported due to lack of gating. She's not had an echo in several years. She has a notable tremor and a significant vocal tremor. 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 I wonder if that's playing a role in her upper airway and causing her to be short of breath. Could also be some degree of diastolic or systolic dysfunction. Finally, she is reported a mass in her  left neck. She's felt swelling has been getting worse over the past several months. She is scheduled to see her primary care provider in about a month.  Amber Wu returns today for follow-up. In the interim she is seen Dr. Melvyn Novas with pulmonary who does not feel she has COPD. He is concerned about upper airway pathology as MI with vocal tremor and change in her symptoms. This could be worsening her shortness of  breath. She also reports profound weakness and and recently has been having hallucinations. I wonder if there is an underlying neurologic responsible for her symptoms. She did have an echo which shows normal systolic function. She may have a degree of diastolic dysfunction and recently blood pressure has been elevated. She reports some more leg swelling.  I saw Amber Wu back in the office today in follow-up. She's had a very eventful several months. She recently fell and fractured her right humerus. She is underwent rehabilitation for that seems to be doing much better. When I last saw her she was having significant complaints of shortness of breath and trouble with her voice. I referred her to neurology who felt that this was likely a vocal tremor. She said at the time of that appointment that it was not that bothersome however indicated was much more significant to me. She still has some significant shortness of breath and has fortunately managed to lose a few pounds which I think will be helpful. She feels a lot of her symptoms may be due to anxiety and has had some improvement with Valium. She wants to talk to her primary care provider about some long-term treatments for anxiety. She does take Celexa 20 mg daily which she's been on for some time.  Amber Wu returns today for follow-up. Overall she is doing very well. She's had some improvement in her tremor on beta blocker. She's interested in a refill on her Celexa today. She says her primary care provider for some reason will not provide this for her. She says that she's done better on this medicine with regards to anxiety. She appears to have good cholesterol control on Crestor. Unfortunately weight is been stable. She will need to get that lower. Her blood pressure is at goal. No bleeding problems on Pradaxa.  11/22/2016  Amber Wu was seen today in follow-up. Overall she is doing well. No new problems since we last saw her. No bleeding problems. Heart  rate is well-controlled with A. fib. She continues to have vocal tremor. She plans to see a specialist for this. Weight is pretty stable but of encouraged her to continue working on getting the weight down.  03/16/2018  Amber Wu returns today for follow-up.  Her main complaints today have to do with tremor and progressive weakness.  She also is reporting back pain.  She is followed by Dr. Krista Blue in neurology as well as Dr. Brett Fairy (mostly for sleep apnea) -she has a fitting for CPAP coming up.  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  Recently she has had labile hypertension and was noted to be hypotensive in the neurologist office.  She had an episode at home where she had extreme weakness, felt presyncopal and was diaphoretic.  I suspect her blood pressure was low although was not checked.  Today her blood pressure is actually high at 154/79.  She has had about 12 pound weight loss since I last saw her.  Labs from November 2018 showed total cholesterol 153,  HDL 61, LDL 62 and triglycerides 152, hemoglobin A1c of 5.8.   07/05/2019  Amber Wu is seen today in follow-up.  Is now been over a year since I last saw her.  She reports doing really well.  Just prior to the COVID pandemic, she received a total right knee replacement.  Since then she has been doing much better.  She is much more active and has lost more than 10 pounds.  Her blood pressure generally well controlled at home around 120 over 70s.  Cholesterol is also been excellent.  She is had less tremor and is been working with a new endocrinologist who felt that she was apparently overtreated with thyroid replacement.  Her levothyroxine dose had been decreased from 150 to 30 mcg which is significant.  Hopefully this will help her tremor somewhat.  03/29/2020  Amber Wu seen today in follow-up.  She continues to do well.  She is lost a little more weight and remains active.  She is having some hip pain.  She  has been followed closely by her primary.  She has lab work fairly regularly.  She has no bleeding issues on Pradaxa.  She remains in A. fib which is permanent and rate controlled.  Most recent lipids were reasonably well controlled.  Blood pressure is at goal today.  PMHx:  Past Medical History:  Diagnosis Date  . Allergy   . Arthritis   . Atrial fibrillation ([place])    2D ECHO, 08/12/2012 - EF >55%, LA moderately dilated, RA moderately dilated, moderate tricuspid valve regurgitation  . Barrett's esophagus   . COPD (chronic obstructive pulmonary disease) ([place])   . Depression   . Dysphonia   . Gastroparesis   . GERD (gastroesophageal reflux disease)   . Hiatal hernia   . History of esophageal stricture   . Hypertension   . Hypothyroidism   . Shortness of breath dyspnea     Past Surgical History:  Procedure Laterality Date  . ABDOMINAL HYSTERECTOMY  1970  . CARDIOVERSION  09/14/2012   Procedure: CARDIOVERSION;  Surgeon: Pixie Casino, MD;  Location: [facility] ENDOSCOPY;  Service: Cardiovascular;  Laterality: N/A;  . CATARACT EXTRACTION Bilateral 06/2014-08/2014  . COLONOSCOPY    . EYE SURGERY    . FACIAL COSMETIC SURGERY  2000  . KNEE SURGERY Right   . ORIF HUMERUS FRACTURE Right 07/06/2015   Procedure: OPEN REDUCTION INTERNAL FIXATION (ORIF) RIGHT PROXIMAL HUMERUS FRACTURE;  Surgeon: Justice Britain, MD;  Location: [facility];  Service: Orthopedics;  Laterality: Right;  . TOTAL KNEE ARTHROPLASTY Right 09/02/2018   Procedure: RIGHT TOTAL KNEE ARTHROPLASTY;  Surgeon: Susa Day, MD;  Location: [facility];  Service: Orthopedics;  Laterality: Right;  2 hrs    FAMHx:  Family History  Problem Relation Age of Onset  . Stroke Mother   . Heart disease Father   . Colon cancer Neg Hx     SOCHx:   reports that she quit smoking about 26 years ago. Her smoking use included cigarettes. She has a 7.50 pack-year smoking history. She has never used smokeless tobacco. She reports that she does not drink  alcohol or use drugs.  ALLERGIES:  Allergies  Allergen Reactions  . Antihistamines, Chlorpheniramine-Type Other (See Comments)    INTENSE SHAKING AND UTI  . Antihistamines, Diphenhydramine-Type Other (See Comments)    INTENSE SHAKING AND UTI  . Antihistamines, Loratadine-Type Other (See Comments)    INTENSE SHAKING AND UTI    ROS: Pertinent items noted in HPI and  remainder of comprehensive ROS otherwise negative.  HOME MEDS: Current Outpatient Medications  Medication Sig Dispense Refill  . allopurinol (ZYLOPRIM) 100 MG tablet Take 200 mg by mouth daily.     Marland Kitchen amLODipine (NORVASC) 10 MG tablet Take 10 mg by mouth every evening.   5  . Cholecalciferol (VITAMIN D-3) 1000 UNITS CAPS Take 1,000 Units by mouth daily.    . citalopram (CELEXA) 20 MG tablet Take 1 tablet (20 mg total) by mouth daily. 30 tablet 0  . dexlansoprazole (DEXILANT) 60 MG capsule Take 60 mg by mouth every evening.     . furosemide (LASIX) 40 MG tablet Take 40 mg by mouth every other day. In the morning    . levothyroxine (SYNTHROID, LEVOTHROID) 150 MCG tablet Take 150 mcg by mouth daily before breakfast.    . Misc Natural Products (OSTEO BI-FLEX ADV TRIPLE ST PO) Take 2 tablets by mouth daily.    . Multiple Vitamin (MULTIVITAMIN WITH MINERALS) TABS tablet Take 1 tablet by mouth daily. One-A-Day    . naphazoline-pheniramine (ALLERGY EYE) 0.025-0.3 % ophthalmic solution Place 1 drop into both eyes 4 (four) times daily as needed for eye irritation or allergies.    . polyethylene glycol (MIRALAX) packet Take 17 g by mouth daily. 14 each 0  . PRADAXA 150 MG CAPS capsule TAKE 1 CAPSULE TWICE A DAY (Patient taking differently: Take 150 mg by mouth 2 (two) times daily. ) 180 capsule 1  . Psyllium (METAMUCIL PO) Take 1 Package by mouth daily.    . TOPROL XL 50 MG 24 hr tablet Take 50 mg by mouth 2 (two) times daily.      No current facility-administered medications for this visit.   Facility-Administered Medications  Ordered in Other Visits  Medication Dose Route Frequency Provider Last Rate Last Admin  . sodium chloride 0.9 % injection 3 mL  3 mL Intravenous PRN Sweetie Giebler, Nadean Corwin, MD        LABS/IMAGING: No results found for this or any previous visit (from the past 48 hour(s)). No results found.  VITALS: BP 114/82   Pulse 93   Ht 5\' 4"  (1.626 m)   Wt 218 lb 6.4 oz (99.1 kg)   LMP  (LMP Unknown)   SpO2 95%   BMI 37.49 kg/m   EXAM: General appearance: alert, no distress and moderately obese Neck: no carotid bruit, no JVD and thyroid not enlarged, symmetric, no tenderness/mass/nodules Lungs: clear to auscultation bilaterally Heart: irregularly irregular rhythm Abdomen: soft, non-tender; bowel sounds normal; no masses,  no organomegaly and obese Extremities: extremities normal, atraumatic, no cyanosis or edema Pulses: 2+ and symmetric Skin: Skin color, texture, turgor normal. No rashes or lesions Neurologic: Grossly normal Psych: Pleasant  EKG: A. fib at 93, nonspecific ST changes-personally reviewed  ASSESSMENT: 1. Permanent atrial fibrillation on Pradaxa 150 mg twice daily  2. COPD with shortness of breath 3. Hypertension 4. Dyslipidemia 5. Morbid obesity 6. History of esophageal stricture and numerous dilatations 7. Moderately dilated left atrium 8. History of esophageal stricture 9. Vocal tremor 10. Weakness/visual hallucinations 11. Hypothyroidism  PLAN: 1.   Ms. Wu continues to do well.  She is tolerating Pradaxa without any bleeding issues.  She has had no stroke or TIA.  Blood pressures well controlled and cholesterol is at goal.  She continues to lose a little bit of weight.  Her vocal tremor is stable.  Follow-up with me annually or sooner as necessary.  Pixie Casino, MD, [facility], FACP  [facility] Director of the Advanced Lipid Disorders &  Cardiovascular Risk Reduction Clinic Diplomate of the American Board of Clinical Lipidology  Attending Cardiologist  Direct Dial: [phone]  Fax: [phone]  Website:  [email or web address]   Nadean Corwin [place] 03/29/2020, 2:23 PM

## 2020-03-29 NOTE — Patient Instructions (Signed)

## 2020-07-19 DIAGNOSIS — H04123 Dry eye syndrome of bilateral lacrimal glands: Secondary | ICD-10-CM | POA: Diagnosis not present

## 2020-07-19 DIAGNOSIS — Z961 Presence of intraocular lens: Secondary | ICD-10-CM | POA: Diagnosis not present

## 2020-07-19 DIAGNOSIS — H35033 Hypertensive retinopathy, bilateral: Secondary | ICD-10-CM | POA: Diagnosis not present

## 2020-07-19 DIAGNOSIS — H26492 Other secondary cataract, left eye: Secondary | ICD-10-CM | POA: Diagnosis not present

## 2020-09-07 DIAGNOSIS — E039 Hypothyroidism, unspecified: Secondary | ICD-10-CM | POA: Diagnosis not present

## 2020-09-07 DIAGNOSIS — M810 Age-related osteoporosis without current pathological fracture: Secondary | ICD-10-CM | POA: Diagnosis not present

## 2020-09-07 DIAGNOSIS — I10 Essential (primary) hypertension: Secondary | ICD-10-CM | POA: Diagnosis not present

## 2020-09-07 DIAGNOSIS — E7800 Pure hypercholesterolemia, unspecified: Secondary | ICD-10-CM | POA: Diagnosis not present

## 2020-10-09 DIAGNOSIS — I10 Essential (primary) hypertension: Secondary | ICD-10-CM | POA: Diagnosis not present

## 2020-10-09 DIAGNOSIS — E7800 Pure hypercholesterolemia, unspecified: Secondary | ICD-10-CM | POA: Diagnosis not present

## 2020-10-09 DIAGNOSIS — E039 Hypothyroidism, unspecified: Secondary | ICD-10-CM | POA: Diagnosis not present

## 2020-10-09 DIAGNOSIS — K2270 Barrett's esophagus without dysplasia: Secondary | ICD-10-CM | POA: Diagnosis not present

## 2020-10-09 DIAGNOSIS — I480 Paroxysmal atrial fibrillation: Secondary | ICD-10-CM | POA: Diagnosis not present

## 2020-10-09 DIAGNOSIS — R4189 Other symptoms and signs involving cognitive functions and awareness: Secondary | ICD-10-CM | POA: Diagnosis not present

## 2020-11-20 DIAGNOSIS — E039 Hypothyroidism, unspecified: Secondary | ICD-10-CM | POA: Diagnosis not present

## 2020-11-21 DIAGNOSIS — E039 Hypothyroidism, unspecified: Secondary | ICD-10-CM | POA: Diagnosis not present

## 2020-11-21 DIAGNOSIS — E7800 Pure hypercholesterolemia, unspecified: Secondary | ICD-10-CM | POA: Diagnosis not present

## 2020-11-21 DIAGNOSIS — M5136 Other intervertebral disc degeneration, lumbar region: Secondary | ICD-10-CM | POA: Diagnosis not present

## 2020-11-21 DIAGNOSIS — I10 Essential (primary) hypertension: Secondary | ICD-10-CM | POA: Diagnosis not present

## 2021-02-13 DIAGNOSIS — I10 Essential (primary) hypertension: Secondary | ICD-10-CM | POA: Diagnosis not present

## 2021-02-13 DIAGNOSIS — E039 Hypothyroidism, unspecified: Secondary | ICD-10-CM | POA: Diagnosis not present

## 2021-02-13 DIAGNOSIS — E7800 Pure hypercholesterolemia, unspecified: Secondary | ICD-10-CM | POA: Diagnosis not present

## 2021-02-20 DIAGNOSIS — Z0000 Encounter for general adult medical examination without abnormal findings: Secondary | ICD-10-CM | POA: Diagnosis not present

## 2021-02-20 DIAGNOSIS — I480 Paroxysmal atrial fibrillation: Secondary | ICD-10-CM | POA: Diagnosis not present

## 2021-02-20 DIAGNOSIS — E039 Hypothyroidism, unspecified: Secondary | ICD-10-CM | POA: Diagnosis not present

## 2021-02-20 DIAGNOSIS — E559 Vitamin D deficiency, unspecified: Secondary | ICD-10-CM | POA: Diagnosis not present

## 2021-02-20 DIAGNOSIS — K219 Gastro-esophageal reflux disease without esophagitis: Secondary | ICD-10-CM | POA: Diagnosis not present

## 2021-02-20 DIAGNOSIS — M5136 Other intervertebral disc degeneration, lumbar region: Secondary | ICD-10-CM | POA: Diagnosis not present

## 2021-02-20 DIAGNOSIS — E7800 Pure hypercholesterolemia, unspecified: Secondary | ICD-10-CM | POA: Diagnosis not present

## 2021-04-11 ENCOUNTER — Ambulatory Visit: Payer: Medicare Other | Admitting: Internal Medicine

## 2021-05-03 ENCOUNTER — Ambulatory Visit: Payer: Medicare Other | Admitting: Family

## 2021-05-03 NOTE — Progress Notes (Deleted)
Office Visit    Patient Name: Amber Wu Date of Encounter: 05/03/2021  PCP:  Jani Gravel, MD   [facility]  Cardiologist:  Pixie Casino, MD *** Advanced Practice Provider:  No care team member to display Electrophysiologist:  None   Chief Complaint    [place] is a 80 y.o. female with a hx of *** presents today for ***   Past Medical History    Past Medical History:  Diagnosis Date  . Allergy   . Arthritis   . Atrial fibrillation ([place])    2D ECHO, 08/12/2012 - EF >55%, LA moderately dilated, RA moderately dilated, moderate tricuspid valve regurgitation  . Barrett's esophagus   . COPD (chronic obstructive pulmonary disease) ([place])   . Depression   . Dysphonia   . Gastroparesis   . GERD (gastroesophageal reflux disease)   . Hiatal hernia   . History of esophageal stricture   . Hypertension   . Hypothyroidism   . Shortness of breath dyspnea    Past Surgical History:  Procedure Laterality Date  . ABDOMINAL HYSTERECTOMY  1970  . CARDIOVERSION  09/14/2012   Procedure: CARDIOVERSION;  Surgeon: Pixie Casino, MD;  Location[facility] ENDOSCOPY;  Service: Cardiovascular;  Laterality: N/A;  . CATARACT EXTRACTION Bilateral 06/2014-08/2014  . COLONOSCOPY    . EYE SURGERY    . FACIAL COSMETIC SURGERY  2000  . KNEE SURGERY Right   . ORIF HUMERUS FRACTURE Right 07/06/2015   Procedure: OPEN REDUCTION INTERNAL FIXATION (ORIF) RIGHT PROXIMAL HUMERUS FRACTURE;  Surgeon: Justice Britain, MD;  Location: [facility];  Service: Orthopedics;  Laterality: Right;  . TOTAL KNEE ARTHROPLASTY Right 09/02/2018   Procedure: RIGHT TOTAL KNEE ARTHROPLASTY;  Surgeon: Susa Day, MD;  Location: [facility];  Service: Orthopedics;  Laterality: Right;  2 hrs    Allergies  Allergies  Allergen Reactions  . Antihistamines, Chlorpheniramine-Type Other (See Comments)    INTENSE SHAKING AND UTI  . Antihistamines, Diphenhydramine-Type Other (See Comments)    INTENSE SHAKING AND UTI   . Antihistamines, Loratadine-Type Other (See Comments)    INTENSE SHAKING AND UTI    History of Present Illness    Amber Wu is a 80 y.o. female with a hx of *** last seen ***.   EKGs/Labs/Other Studies Reviewed:   The following studies were reviewed today: ***  EKG:  EKG is *** ordered today.  The ekg ordered today demonstrates ***  Recent Labs: No results found for requested labs within last 8760 hours.  Recent Lipid Panel No results found for: CHOL, TRIG, HDL, CHOLHDL, VLDL, LDLCALC, LDLDIRECT  Risk Assessment/Calculations:  {Does this patient have ATRIAL FIBRILLATION?[phone]}  Home Medications   No outpatient medications have been marked as taking for the 05/03/21 encounter (Appointment) with Loel Dubonnet, NP.     Review of Systems   ***   ROS All other systems reviewed and are otherwise negative except as noted above.  Physical Exam    VS:  LMP  (LMP Unknown)  , BMI There is no height or weight on file to calculate BMI.  Wt Readings from Last 3 Encounters:  03/29/20 218 lb 6.4 oz (99.1 kg)  07/05/19 221 lb (100.2 kg)  09/02/18 230 lb 2 oz (104.4 kg)     GEN: Well nourished, well developed, in no acute distress. HEENT: normal. Neck: Supple, no JVD, carotid bruits, or masses. Cardiac: ***RRR, no murmurs, rubs, or gallops. No clubbing, cyanosis, edema.  ***Radials/DP/PT 2+  and equal bilaterally.  Respiratory:  ***Respirations regular and unlabored, clear to auscultation bilaterally. GI: Soft, nontender, nondistended. MS: No deformity or atrophy. Skin: Warm and dry, no rash. Neuro:  Strength and sensation are intact. Psych: Normal affect.  Assessment & Plan    1. ***  Disposition: Follow up {follow up:15908} with ***   Signed, Loel Dubonnet, NP 05/03/2021, 1:16 PM [facility]

## 2021-05-09 ENCOUNTER — Ambulatory Visit (INDEPENDENT_AMBULATORY_CARE_PROVIDER_SITE_OTHER): Payer: Medicare Other | Admitting: Internal Medicine

## 2021-05-09 ENCOUNTER — Encounter: Payer: Self-pay | Admitting: Internal Medicine

## 2021-05-09 ENCOUNTER — Other Ambulatory Visit: Payer: Self-pay

## 2021-05-09 VITALS — BP 120/78 | HR 69 | Ht 65.0 in | Wt 217.2 lb

## 2021-05-09 DIAGNOSIS — I4821 Permanent atrial fibrillation: Secondary | ICD-10-CM

## 2021-05-09 DIAGNOSIS — R498 Other voice and resonance disorders: Secondary | ICD-10-CM

## 2021-05-09 DIAGNOSIS — Z6841 Body Mass Index (BMI) 40.0 and over, adult: Secondary | ICD-10-CM | POA: Diagnosis not present

## 2021-05-09 DIAGNOSIS — I4819 Other persistent atrial fibrillation: Secondary | ICD-10-CM

## 2021-05-09 DIAGNOSIS — I10 Essential (primary) hypertension: Secondary | ICD-10-CM

## 2021-05-09 DIAGNOSIS — E782 Mixed hyperlipidemia: Secondary | ICD-10-CM | POA: Diagnosis not present

## 2021-05-09 NOTE — Patient Instructions (Signed)

## 2021-05-09 NOTE — Progress Notes (Signed)
OFFICE NOTE  Chief Complaint:  Routine follow-up  Primary Care Physician: Jani Gravel, MD  HPI:  Amber Wu is a 80 year old female who has a history of COPD, morbid obesity, gout, hypertension and multiple other problems, including esophageal stricture and numerous dilatations. She was found to be in A-fib and you appropriately started her on Pradaxa as well as a beta blocker. 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 She says her handwriting is now more readable. She seems to be tolerating the Pradaxa without any adverse bleeding complications. She has been on that for now 1 month and therefore should be at low risk of having an intracardiac thrombus. Unfortunately, her heart remains in A-fib with a better controlled ventricular response at 99. She is only mildly aware of this A-fib. We discussed pros and cons of A-fib. However, given the fact that this is fairly new onset, I think she deserves an opportunity to try to get back to sinus rhythm. We therefore will pursue an elective DC cardioversion in the next week or so. I will go ahead and keep you informed of the progress of that. Finally, we did an echocardiogram. Her LV systolic function and diastolic function appear to be preserved; however, the left atrium is moderately dilated with a volume of 34 mL/meter squared and otherwise no significant valvular disease. After her last visit she underwent an elective cardioversion, which was unsuccessful at restoring sinus rhythm. Therefore we have elected to manage her with rate control and anticoagulation. Today her main complaint is shortness of breath and fatigue which is not much different than it has been in the past.   She seems to be tolerating her Pradaxa. She's had no bleeding complaints with this. Her atrial fibrillation is rate controlled. She did report  an episode over the holidays where she had significant lower extremity swelling. She was in [place] and contemplated going to the emergency room ultimately took some of her friend's Lasix. This did improve her swelling and it has since not come back. She denies eating too much salt, but when questioned about this she said that salt is good for her fact it was recommended because of her thyroid problems? Which did not make much sense to me.  Amber Wu returns today for followup. She feels easily she's been having progressive shortness of breath. She does not think this is attributed to her weight of because it is been stable. She also had noted that she is unable to do most activities. She gets progressively weak and fatigued doing simple activities such as grocery shopping. She noted the other day that she became profusely diuretic when walking through [employer] which was unusual for her. Despite this, she does not report any chest pain.  Some still back in the office today. Again she is complaining of shortness of breath with exertion. Her stress test was performed in the fall which was negative for ischemia. EF was not reported due to lack of gating. She's not had an echo in several years. She has a notable tremor and a significant vocal tremor. 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 I wonder if that's playing a role in her upper airway and causing her to be short of breath. Could also be some degree of diastolic or systolic dysfunction. Finally, she is reported a mass in her  left neck. She's felt swelling has been getting worse over the past several months. She is scheduled to see her primary care provider in about a month.  Amber Wu returns today for follow-up. In the interim she is seen Dr. Melvyn Novas with pulmonary who does not feel she has COPD. He is concerned about upper airway pathology as MI with vocal tremor and change in her symptoms. This could be worsening her shortness of  breath. She also reports profound weakness and and recently has been having hallucinations. I wonder if there is an underlying neurologic responsible for her symptoms. She did have an echo which shows normal systolic function. She may have a degree of diastolic dysfunction and recently blood pressure has been elevated. She reports some more leg swelling.  I saw Amber Wu back in the office today in follow-up. She's had a very eventful several months. She recently fell and fractured her right humerus. She is underwent rehabilitation for that seems to be doing much better. When I last saw her she was having significant complaints of shortness of breath and trouble with her voice. I referred her to neurology who felt that this was likely a vocal tremor. She said at the time of that appointment that it was not that bothersome however indicated was much more significant to me. She still has some significant shortness of breath and has fortunately managed to lose a few pounds which I think will be helpful. She feels a lot of her symptoms may be due to anxiety and has had some improvement with Valium. She wants to talk to her primary care provider about some long-term treatments for anxiety. She does take Celexa 20 mg daily which she's been on for some time.  Amber Wu returns today for follow-up. Overall she is doing very well. She's had some improvement in her tremor on beta blocker. She's interested in a refill on her Celexa today. She says her primary care provider for some reason will not provide this for her. She says that she's done better on this medicine with regards to anxiety. She appears to have good cholesterol control on Crestor. Unfortunately weight is been stable. She will need to get that lower. Her blood pressure is at goal. No bleeding problems on Pradaxa.  11/22/2016  Amber Wu was seen today in follow-up. Overall she is doing well. No new problems since we last saw her. No bleeding problems. Heart  rate is well-controlled with A. fib. She continues to have vocal tremor. She plans to see a specialist for this. Weight is pretty stable but of encouraged her to continue working on getting the weight down.  03/16/2018  Amber Wu returns today for follow-up.  Her main complaints today have to do with tremor and progressive weakness.  She also is reporting back pain.  She is followed by Dr. Krista Blue in neurology as well as Dr. Brett Fairy (mostly for sleep apnea) -she has a fitting for CPAP coming up.  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  Recently she has had labile hypertension and was noted to be hypotensive in the neurologist office.  She had an episode at home where she had extreme weakness, felt presyncopal and was diaphoretic.  I suspect her blood pressure was low although was not checked.  Today her blood pressure is actually high at 154/79.  She has had about 12 pound weight loss since I last saw her.  Labs from November 2018 showed total cholesterol 153,  HDL 61, LDL 62 and triglycerides 152, hemoglobin A1c of 5.8.   07/05/2019  Amber Wu is seen today in follow-up.  Is now been over a year since I last saw her.  She reports doing really well.  Just prior to the COVID pandemic, she received a total right knee replacement.  Since then she has been doing much better.  She is much more active and has lost more than 10 pounds.  Her blood pressure generally well controlled at home around 120 over 70s.  Cholesterol is also been excellent.  She is had less tremor and is been working with a new endocrinologist who felt that she was apparently overtreated with thyroid replacement.  Her levothyroxine dose had been decreased from 150 to 30 mcg which is significant.  Hopefully this will help her tremor somewhat.  03/29/2020  Amber Wu seen today in follow-up.  She continues to do well.  She is lost a little more weight and remains active.  She is having some hip pain.  She  has been followed closely by her primary.  She has lab work fairly regularly.  She has no bleeding issues on Pradaxa.  She remains in A. fib which is permanent and rate controlled.  Most recent lipids were reasonably well controlled.  Blood pressure is at goal today.  05/09/2021  Amber Wu returns today for annual follow-up.  Overall she says she is doing well.  She is now seeing Dr. Janie Morning.  Blood pressure is well controlled today.  She is unaware of her A. fib which is permanent.  Rate is well controlled.  She remains on Pradaxa with no bleeding issues.  He has had no stroke or TIA that were aware of.  Lipids appeared well controlled in March with total 163, HDL 67, LDL 74 and triglycerides 129.  PMHx:  Past Medical History:  Diagnosis Date  . Allergy   . Arthritis   . Atrial fibrillation ([place])    2D ECHO, 08/12/2012 - EF >55%, LA moderately dilated, RA moderately dilated, moderate tricuspid valve regurgitation  . Barrett's esophagus   . COPD (chronic obstructive pulmonary disease) ([place])   . Depression   . Dysphonia   . Gastroparesis   . GERD (gastroesophageal reflux disease)   . Hiatal hernia   . History of esophageal stricture   . Hypertension   . Hypothyroidism   . Shortness of breath dyspnea     Past Surgical History:  Procedure Laterality Date  . ABDOMINAL HYSTERECTOMY  1970  . CARDIOVERSION  09/14/2012   Procedure: CARDIOVERSION;  Surgeon: Pixie Casino, MD;  Location: [facility] ENDOSCOPY;  Service: Cardiovascular;  Laterality: N/A;  . CATARACT EXTRACTION Bilateral 06/2014-08/2014  . COLONOSCOPY    . EYE SURGERY    . FACIAL COSMETIC SURGERY  2000  . KNEE SURGERY Right   . ORIF HUMERUS FRACTURE Right 07/06/2015   Procedure: OPEN REDUCTION INTERNAL FIXATION (ORIF) RIGHT PROXIMAL HUMERUS FRACTURE;  Surgeon: Justice Britain, MD;  Location: [facility];  Service: Orthopedics;  Laterality: Right;  . TOTAL KNEE ARTHROPLASTY Right 09/02/2018   Procedure: RIGHT TOTAL KNEE ARTHROPLASTY;   Surgeon: Susa Day, MD;  Location: [facility] ORS;  Service: Orthopedics;  Laterality: Right;  2 hrs    FAMHx:  Family History  Problem Relation Age of Onset  . Stroke Mother   . Heart disease Father   . Colon cancer Neg Hx     SOCHx:   reports that she quit smoking about 27 years ago. Her  smoking use included cigarettes. She has a 7.50 pack-year smoking history. She has never used smokeless tobacco. She reports that she does not drink alcohol and does not use drugs.  ALLERGIES:  Allergies  Allergen Reactions  . Antihistamines, Chlorpheniramine-Type Other (See Comments)    INTENSE SHAKING AND UTI  . Antihistamines, Diphenhydramine-Type Other (See Comments)    INTENSE SHAKING AND UTI  . Antihistamines, Loratadine-Type Other (See Comments)    INTENSE SHAKING AND UTI    ROS: Pertinent items noted in HPI and remainder of comprehensive ROS otherwise negative.  HOME MEDS: Current Outpatient Medications  Medication Sig Dispense Refill  . allopurinol (ZYLOPRIM) 100 MG tablet Take 200 mg by mouth daily.    Marland Kitchen amLODipine (NORVASC) 10 MG tablet Take 10 mg by mouth every evening.   5  . Cholecalciferol (VITAMIN D-3) 1000 UNITS CAPS Take 1,000 Units by mouth daily.    . citalopram (CELEXA) 20 MG tablet Take 1 tablet (20 mg total) by mouth daily. 30 tablet 0  . dexlansoprazole (DEXILANT) 60 MG capsule Take 60 mg by mouth every evening.    . furosemide (LASIX) 40 MG tablet Take 40 mg by mouth every other day. In the morning    . levothyroxine (SYNTHROID, LEVOTHROID) 150 MCG tablet Take 150 mcg by mouth daily before breakfast.    . Misc Natural Products (OSTEO BI-FLEX ADV TRIPLE ST PO) Take 2 tablets by mouth daily.    . Multiple Vitamin (MULTIVITAMIN WITH MINERALS) TABS tablet Take 1 tablet by mouth daily. One-A-Day    . naphazoline-pheniramine (NAPHCON-A) 0.025-0.3 % ophthalmic solution Place 1 drop into both eyes 4 (four) times daily as needed for eye irritation or allergies.    .  polyethylene glycol (MIRALAX) packet Take 17 g by mouth daily. 14 each 0  . PRADAXA 150 MG CAPS capsule TAKE 1 CAPSULE TWICE A DAY (Patient taking differently: Take 150 mg by mouth 2 (two) times daily.) 180 capsule 1  . Psyllium (METAMUCIL PO) Take 1 Package by mouth daily.    . TOPROL XL 50 MG 24 hr tablet Take 50 mg by mouth 2 (two) times daily.      No current facility-administered medications for this visit.   Facility-Administered Medications Ordered in Other Visits  Medication Dose Route Frequency Provider Last Rate Last Admin  . sodium chloride 0.9 % injection 3 mL  3 mL Intravenous PRN Amber Wu, Amber Corwin, MD        LABS/IMAGING: No results found for this or any previous visit (from the past 48 hour(s)). No results found.  VITALS: BP 120/78 (BP Location: Left Arm, Patient Position: Sitting, Cuff Size: Large)   Pulse 69   Ht 5\' 5"  (1.651 m)   Wt 217 lb 3.2 oz (98.5 kg)   LMP  (LMP Unknown)   BMI 36.14 kg/m   EXAM: General appearance: alert, no distress and moderately obese Neck: no carotid bruit, no JVD and thyroid not enlarged, symmetric, no tenderness/mass/nodules Lungs: clear to auscultation bilaterally Heart: irregularly irregular rhythm Abdomen: soft, non-tender; bowel sounds normal; no masses,  no organomegaly and obese Extremities: extremities normal, atraumatic, no cyanosis or edema Pulses: 2+ and symmetric Skin: Skin color, texture, turgor normal. No rashes or lesions Neurologic: Grossly normal Psych: Pleasant  EKG: A. fib at 69, inferior ST and T wave changes-personally reviewed  ASSESSMENT: 1. Permanent atrial fibrillation on Pradaxa 150 mg twice daily  2. COPD with shortness of breath 3. Hypertension 4. Dyslipidemia 5. Morbid obesity 6. History of esophageal  stricture and numerous dilatations 7. Moderately dilated left atrium 8. History of esophageal stricture 9. Vocal tremor 10. Weakness/visual hallucinations 11. Hypothyroidism  PLAN: 1.   Ms.  Moomaw has had no real acute issues over the past year.  She is active and does regular exercises.  She is in permanent A. fib which is rate controlled on Pradaxa.  She needs to continue to work on weight loss.  Her lipids are at goal.  No changes to her medicines at this time.  Follow-up with me annually or sooner as necessary.  Pixie Casino, MD, [facility], [place] Director of the Advanced Lipid Disorders &  Cardiovascular Risk Reduction Clinic Diplomate of the American Board of Clinical Lipidology Attending Cardiologist  Direct Dial: [phone]  Fax: [phone]  Website:  [email or web address] 05/09/2021, 3:19 PM

## 2021-06-04 DIAGNOSIS — K219 Gastro-esophageal reflux disease without esophagitis: Secondary | ICD-10-CM | POA: Diagnosis not present

## 2021-06-04 DIAGNOSIS — L089 Local infection of the skin and subcutaneous tissue, unspecified: Secondary | ICD-10-CM | POA: Diagnosis not present

## 2021-06-12 DIAGNOSIS — L089 Local infection of the skin and subcutaneous tissue, unspecified: Secondary | ICD-10-CM | POA: Diagnosis not present

## 2021-06-15 DIAGNOSIS — L989 Disorder of the skin and subcutaneous tissue, unspecified: Secondary | ICD-10-CM | POA: Diagnosis not present

## 2021-06-15 DIAGNOSIS — C44529 Squamous cell carcinoma of skin of other part of trunk: Secondary | ICD-10-CM | POA: Diagnosis not present

## 2021-07-19 DIAGNOSIS — Z961 Presence of intraocular lens: Secondary | ICD-10-CM | POA: Diagnosis not present

## 2021-07-19 DIAGNOSIS — H35033 Hypertensive retinopathy, bilateral: Secondary | ICD-10-CM | POA: Diagnosis not present

## 2021-07-19 DIAGNOSIS — H04123 Dry eye syndrome of bilateral lacrimal glands: Secondary | ICD-10-CM | POA: Diagnosis not present

## 2021-07-19 DIAGNOSIS — H26492 Other secondary cataract, left eye: Secondary | ICD-10-CM | POA: Diagnosis not present

## 2021-07-30 ENCOUNTER — Encounter: Payer: Self-pay | Admitting: Internal Medicine

## 2021-07-30 ENCOUNTER — Telehealth: Payer: Self-pay | Admitting: Internal Medicine

## 2021-07-30 ENCOUNTER — Other Ambulatory Visit: Payer: Self-pay | Admitting: General Surgery

## 2021-07-30 DIAGNOSIS — C761 Malignant neoplasm of thorax: Secondary | ICD-10-CM | POA: Diagnosis not present

## 2021-07-30 NOTE — Telephone Encounter (Signed)
   Ok to hold Pradaxa for 3 days prior to procedure. Restart after. No testing necessary for risk stratification. Thanks for reaching out.  Pixie Casino, MD, [facility], [place] Director of the Advanced Lipid Disorders &  Cardiovascular Risk Reduction Clinic Diplomate of the American Board of Clinical Lipidology Attending Cardiologist  Direct Dial: [phone]  Fax: [phone]  Website:  [email or web address]

## 2021-07-30 NOTE — Telephone Encounter (Signed)
Erroneous encounter

## 2021-07-30 NOTE — Telephone Encounter (Signed)
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  It will require around 1 hour of general anesthesia.    1. Can we hold blood thinner (pradaxa) 2. Does she need any testing for risk stratification?  Thanks for your time Stark Klein, MD Surgical Oncology

## 2021-08-20 NOTE — Progress Notes (Signed)
Surgical Instructions    Your procedure is scheduled on Tuesday, September 20th.  Report to [facility] Main Entrance "A" at 9:00 A.M., then check in with the Admitting office.  Call this number if you have problems the morning of surgery:  [phone]   If you have any questions prior to your surgery date call [phone]: Open Monday-Friday 8am-4pm    Remember:  Do not eat after midnight the night before your surgery  You may drink clear liquids until 8:00 AM the morning of your surgery.   Clear liquids allowed are: Water, Non-Citrus Juices (without pulp), Carbonated Beverages, Clear Tea, Black Coffee ONLY (NO MILK, CREAM OR POWDERED CREAMER of any kind), and Gatorade  Please complete your PRE-SURGERY ENSURE that was provided to you by 8:00 AM the morning of surgery.  Please, if able, drink it in one setting. DO NOT SIP.     Take these medicines the morning of surgery with A SIP OF WATER Allopurinol (Zyloprim) Citalopram (Celexa) Levothyroxine (Synthroid) Pantoprazole (Protonix) Rosuvastatin (Crestor) Toprol XL  Follow your surgeon's instructions on when to stop Pradaxa.  If no instructions were given by your surgeon then you will need to call the office to get those instructions.     As of today, STOP taking any Aspirin (unless otherwise instructed by your surgeon) Aleve, Naproxen, Ibuprofen, Motrin, Advil, Goody's, BC's, all herbal medications, fish oil, and all vitamins.          Do not wear jewelry, makeup, or nail polish Do not wear lotions, powders, perfumes, or deodorant. Do not shave 48 hours prior to surgery.   Do not bring valuables to the hospital.             [facility] is not responsible for any belongings or valuables.  Do NOT Smoke (Tobacco/Vaping)  24 hours prior to your procedure If you use a CPAP at night, you may bring your mask for your overnight stay.   Contacts, glasses, dentures or bridgework may not be worn into surgery, please bring cases for  these belongings   For patients admitted to the hospital, discharge time will be determined by your treatment team.   Patients discharged the day of surgery will not be allowed to drive home, and someone needs to stay with them for 24 hours.  ONLY 1 SUPPORT PERSON MAY BE PRESENT WHILE YOU ARE IN SURGERY. IF YOU ARE TO BE ADMITTED ONCE YOU ARE IN YOUR ROOM YOU WILL BE ALLOWED TWO (2) VISITORS.  Minor children may have two parents present. Special consideration for safety and communication needs will be reviewed on a case by case basis.  Special instructions:    Oral Hygiene is also important to reduce your risk of infection.  Remember - BRUSH YOUR TEETH THE MORNING OF SURGERY WITH YOUR REGULAR TOOTHPASTE   [place]- Preparing For Surgery  Before surgery, you can play an important role. Because skin is not sterile, your skin needs to be as free of germs as possible. You can reduce the number of germs on your skin by washing with CHG (chlorahexidine gluconate) Soap before surgery.  CHG is an antiseptic cleaner which kills germs and bonds with the skin to continue killing germs even after washing.     Please do not use if you have an allergy to CHG or antibacterial soaps. If your skin becomes reddened/irritated stop using the CHG.  Do not shave (including legs and underarms) for at least 48 hours prior to first CHG shower. It  is OK to shave your face.  Please follow these instructions carefully.     Shower the NIGHT BEFORE SURGERY and the MORNING OF SURGERY with CHG Soap.   If you chose to wash your hair, wash your hair first as usual with your normal shampoo. After you shampoo, rinse your hair and body thoroughly to remove the shampoo.  Then [employer] and genitals (private parts) with your normal soap and rinse thoroughly to remove soap.  After that Use CHG Soap as you would any other liquid soap. You can apply CHG directly to the skin and wash gently with a scrungie or a clean washcloth.    Apply the CHG Soap to your body ONLY FROM THE NECK DOWN.  Do not use on open wounds or open sores. Avoid contact with your eyes, ears, mouth and genitals (private parts). Wash Face and genitals (private parts)  with your normal soap.   Wash thoroughly, paying special attention to the area where your surgery will be performed.  Thoroughly rinse your body with warm water from the neck down.  DO NOT shower/wash with your normal soap after using and rinsing off the CHG Soap.  Pat yourself dry with a CLEAN TOWEL.  Wear CLEAN PAJAMAS to bed the night before surgery  Place CLEAN SHEETS on your bed the night before your surgery  DO NOT SLEEP WITH PETS.   Day of Surgery:  Take a shower with CHG soap. Wear Clean/Comfortable clothing the morning of surgery Do not apply any deodorants/lotions.   Remember to brush your teeth WITH YOUR REGULAR TOOTHPASTE.   Please read over the following fact sheets that you were given.

## 2021-08-21 ENCOUNTER — Encounter (HOSPITAL_COMMUNITY)
Admission: RE | Admit: 2021-08-21 | Discharge: 2021-08-21 | Disposition: A | Discharge status: Home / Self Care | Payer: Medicare Other | Source: Ambulatory Visit | Attending: General Surgery | Admitting: General Surgery

## 2021-08-21 ENCOUNTER — Other Ambulatory Visit: Payer: Self-pay

## 2021-08-21 ENCOUNTER — Encounter (HOSPITAL_COMMUNITY): Payer: Self-pay

## 2021-08-21 DIAGNOSIS — Z01812 Encounter for preprocedural laboratory examination: Secondary | ICD-10-CM | POA: Insufficient documentation

## 2021-08-21 DIAGNOSIS — E119 Type 2 diabetes mellitus without complications: Secondary | ICD-10-CM | POA: Diagnosis not present

## 2021-08-21 LAB — BASIC METABOLIC PANEL
Anion gap: 13 (ref 5–15)
BUN: 13 mg/dL (ref 8–23)
CO2: 24 mmol/L (ref 22–32)
Calcium: 9.2 mg/dL (ref 8.9–10.3)
Chloride: 87 mmol/L — ABNORMAL LOW (ref 98–111)
Creatinine, Ser: 1.03 mg/dL — ABNORMAL HIGH (ref 0.44–1.00)
GFR, Estimated: 55 mL/min — ABNORMAL LOW (ref >60)
Glucose, Bld: 108 mg/dL — ABNORMAL HIGH (ref 70–99)
Potassium: 3 mmol/L — ABNORMAL LOW (ref 3.5–5.1)
Sodium: 124 mmol/L — ABNORMAL LOW (ref 135–145)

## 2021-08-21 LAB — CBC
HCT: 37 % (ref 36.0–46.0)
Hemoglobin: 12.6 g/dL (ref 12.0–15.0)
MCH: 30.4 pg (ref 26.0–34.0)
MCHC: 34.1 g/dL (ref 30.0–36.0)
MCV: 89.4 fL (ref 80.0–100.0)
Platelets: 387 10*3/uL (ref 150–400)
RBC: 4.14 MIL/uL (ref 3.87–5.11)
RDW: 12.8 % (ref 11.5–15.5)
WBC: 8.3 10*3/uL (ref 4.0–10.5)
nRBC: 0 % (ref 0.0–0.2)

## 2021-08-21 NOTE — Progress Notes (Signed)
PCP - Janie Morning DO Cardiologist - Lyman Bishop  PPM/ICD - denies Device Orders -  Rep Notified -   Chest x-ray - no EKG - 05/09/21 Stress Test - no ECHO - 03/24/15 Cardiac Cath - no  Sleep Study - denies CPAP -   Fasting Blood Sugar - n/a Checks Blood Sugar _____ times a day  Blood Thinner Instructions:stop Pradaxa 3 days prior to surgery. Aspirin Instructions:n/a  ERAS Protcol -clear liquids until 0800 PRE-SURGERY Ensure or G2- Ensure  COVID TEST- n/a -ambulatory surgery    Anesthesia review: yes-cardiac surgery;abnormal labs-Na+ 124,K+3.0  Patient denies shortness of breath, fever, cough and chest pain at PAT appointment   All instructions explained to the patient, with a verbal understanding of the material. Patient agrees to go over the instructions while at home for a better understanding. Patient also instructed to self quarantine after being tested for COVID-19. The opportunity to ask questions was provided.

## 2021-08-21 NOTE — Progress Notes (Signed)
Surgical Instructions    Your procedure is scheduled on Tuesday, September 20th.  Report to [facility] Main Entrance "A" at 9:00 A.M., then check in with the Admitting office.  Call this number if you have problems the morning of surgery:  [phone]   If you have any questions prior to your surgery date call [phone]: Open Monday-Friday 8am-4pm    Remember:  Do not eat after midnight the night before your surgery  You may drink clear liquids until 8:00 AM the morning of your surgery.   Clear liquids allowed are: Water, Non-Citrus Juices (without pulp), Carbonated Beverages, Clear Tea, Black Coffee ONLY (NO MILK, CREAM OR POWDERED CREAMER of any kind), and Gatorade  Please complete your PRE-SURGERY ENSURE that was provided to you by 8:00 AM the morning of surgery.  Please, if able, drink it in one setting. DO NOT SIP.     Take these medicines the morning of surgery with A SIP OF WATER Allopurinol (Zyloprim) Citalopram (Celexa) Levothyroxine (Synthroid) Pantoprazole (Protonix) Rosuvastatin (Crestor) Toprol XL  Follow your surgeon's instructions on when to stop Pradaxa.  Hold Pradaxa three days prior to surgery.    As of today, STOP taking any Aspirin (unless otherwise instructed by your surgeon) Aleve, Naproxen, Ibuprofen, Motrin, Advil, Goody's, BC's, all herbal medications, fish oil, and all vitamins.          Do not wear jewelry, makeup, or nail polish Do not wear lotions, powders, perfumes, or deodorant. Do not shave 48 hours prior to surgery.   Do not bring valuables to the hospital.             [facility] is not responsible for any belongings or valuables.  Do NOT Smoke (Tobacco/Vaping)  24 hours prior to your procedure If you use a CPAP at night, you may bring your mask for your overnight stay.   Contacts, glasses, dentures or bridgework may not be worn into surgery, please bring cases for these belongings   For patients admitted to the hospital, discharge  time will be determined by your treatment team.   Patients discharged the day of surgery will not be allowed to drive home, and someone needs to stay with them for 24 hours.  ONLY 1 SUPPORT PERSON MAY BE PRESENT WHILE YOU ARE IN SURGERY. IF YOU ARE TO BE ADMITTED ONCE YOU ARE IN YOUR ROOM YOU WILL BE ALLOWED TWO (2) VISITORS.  Minor children may have two parents present. Special consideration for safety and communication needs will be reviewed on a case by case basis.  Special instructions:    Oral Hygiene is also important to reduce your risk of infection.  Remember - BRUSH YOUR TEETH THE MORNING OF SURGERY WITH YOUR REGULAR TOOTHPASTE   [place]- Preparing For Surgery  Before surgery, you can play an important role. Because skin is not sterile, your skin needs to be as free of germs as possible. You can reduce the number of germs on your skin by washing with CHG (chlorahexidine gluconate) Soap before surgery.  CHG is an antiseptic cleaner which kills germs and bonds with the skin to continue killing germs even after washing.     Please do not use if you have an allergy to CHG or antibacterial soaps. If your skin becomes reddened/irritated stop using the CHG.  Do not shave (including legs and underarms) for at least 48 hours prior to first CHG shower. It is OK to shave your face.  Please follow these instructions carefully.  Shower the NIGHT BEFORE SURGERY and the MORNING OF SURGERY with CHG Soap.   If you chose to wash your hair, wash your hair first as usual with your normal shampoo. After you shampoo, rinse your hair and body thoroughly to remove the shampoo.  Then [employer] and genitals (private parts) with your normal soap and rinse thoroughly to remove soap.  After that Use CHG Soap as you would any other liquid soap. You can apply CHG directly to the skin and wash gently with a scrungie or a clean washcloth.   Apply the CHG Soap to your body ONLY FROM THE NECK DOWN.  Do not  use on open wounds or open sores. Avoid contact with your eyes, ears, mouth and genitals (private parts). Wash Face and genitals (private parts)  with your normal soap.   Wash thoroughly, paying special attention to the area where your surgery will be performed.  Thoroughly rinse your body with warm water from the neck down.  DO NOT shower/wash with your normal soap after using and rinsing off the CHG Soap.  Pat yourself dry with a CLEAN TOWEL.  Wear CLEAN PAJAMAS to bed the night before surgery  Place CLEAN SHEETS on your bed the night before your surgery  DO NOT SLEEP WITH PETS.   Day of Surgery:  Take a shower with CHG soap. Wear Clean/Comfortable clothing the morning of surgery Do not apply any deodorants/lotions.   Remember to brush your teeth WITH YOUR REGULAR TOOTHPASTE.   Please read over the following fact sheets that you were given.

## 2021-08-21 NOTE — Progress Notes (Signed)
Patient confused at PAT appointment. She signed her consent at the beginning of her appointment, but then during the course of her PAT visit shestated several times that she didn't know she was having surgery and asked what she was having surgery for.  She also stated that she did not know she was on a blood thinner. She takes Pradaxa twice daily and was instructed to hold for 3 days prior to surgery. Patient was accompanied by her neighbor,Everett. She requested for him to be with her during appointment and gave permission for him to be present while questions were asked and instructions given. Pt is scheduled for ambulatory surgery. She said she didn't have anyone to stay with her after surgery. Karle Starch said he could not stay with her. Patient then said she would get her daughter-in-law who lives in [place] to stay with her. Spoke with Hassan Rowan at Dr. Marlowe Aschoff office to express my concerns that pt has no one to stay with her after surgery  and pt was confused and did not seem to remember that she is having surgery. Hassan Rowan said she would relay this information to Dr. Barry Dienes. Then called Quita Skye Hay,pt's sister who is listed as DPR. Ms. Jeannine Kitten stated that pt's daughter in law, Clovia Cuff was planning to stay with patient after surgery. I Informed Ms. Hay that pt's surgery is scheduled for 08/28/21 and pt needs to be at hospital at [place]. Pt's BMP results indicated Na+ of 124,K+ of 3.0. Reported these results to [facility] in Dr. Marlowe Aschoff office and to Alda Berthold PA. Hassan Rowan will relay results to Dr. Barry Dienes and Jeneen Rinks will review chart.

## 2021-08-28 DIAGNOSIS — E871 Hypo-osmolality and hyponatremia: Secondary | ICD-10-CM

## 2021-08-28 DIAGNOSIS — R899 Unspecified abnormal finding in specimens from other organs, systems and tissues: Secondary | ICD-10-CM | POA: Diagnosis not present

## 2021-08-28 HISTORY — DX: Hypo-osmolality and hyponatremia: E87.1

## 2021-08-28 NOTE — Progress Notes (Signed)
Patient and daughter-in-law arrived to hospital today but surgery was rescheduled to 09/27/21.  Patient states that she was not aware of change.  Per Dr. Barry Dienes, surgery was rescheduled due to abnormal labs (Na 124).  Dr. Barry Dienes reached out to PCP office for PCP to schedule appointment with patient.  Patient stated that she had not heard from PCP office and was not able to tell me who her PCP was.    Nurse gave patient and daughter-in-law PCP information and instructed them to call PCP to get an appointment scheduled.  Daughter-in-law informed nurse that patient has been confused over the past couple of weeks.     Patient requested that we reach out to daughter-in-law, Clovia Cuff, for appointment scheduling.

## 2021-09-18 ENCOUNTER — Encounter (HOSPITAL_COMMUNITY): Payer: Self-pay

## 2021-09-18 ENCOUNTER — Other Ambulatory Visit: Payer: Self-pay

## 2021-09-18 ENCOUNTER — Encounter (HOSPITAL_COMMUNITY)
Admission: RE | Admit: 2021-09-18 | Discharge: 2021-09-18 | Disposition: A | Discharge status: Home / Self Care | Payer: Medicare Other | Source: Ambulatory Visit | Attending: General Surgery | Admitting: General Surgery

## 2021-09-18 DIAGNOSIS — Z01812 Encounter for preprocedural laboratory examination: Secondary | ICD-10-CM | POA: Insufficient documentation

## 2021-09-18 DIAGNOSIS — R0902 Hypoxemia: Secondary | ICD-10-CM | POA: Insufficient documentation

## 2021-09-18 DIAGNOSIS — G4733 Obstructive sleep apnea (adult) (pediatric): Secondary | ICD-10-CM | POA: Insufficient documentation

## 2021-09-18 DIAGNOSIS — Z7901 Long term (current) use of anticoagulants: Secondary | ICD-10-CM | POA: Insufficient documentation

## 2021-09-18 DIAGNOSIS — I4821 Permanent atrial fibrillation: Secondary | ICD-10-CM | POA: Insufficient documentation

## 2021-09-18 LAB — CBC
HCT: 35.9 % — ABNORMAL LOW (ref 36.0–46.0)
Hemoglobin: 11.9 g/dL — ABNORMAL LOW (ref 12.0–15.0)
MCH: 31.2 pg (ref 26.0–34.0)
MCHC: 33.1 g/dL (ref 30.0–36.0)
MCV: 94.2 fL (ref 80.0–100.0)
Platelets: 339 10*3/uL (ref 150–400)
RBC: 3.81 MIL/uL — ABNORMAL LOW (ref 3.87–5.11)
RDW: 13.6 % (ref 11.5–15.5)
WBC: 6.7 10*3/uL (ref 4.0–10.5)
nRBC: 0 % (ref 0.0–0.2)

## 2021-09-18 LAB — BASIC METABOLIC PANEL
Anion gap: 10 (ref 5–15)
BUN: 6 mg/dL — ABNORMAL LOW (ref 8–23)
CO2: 29 mmol/L (ref 22–32)
Calcium: 8.9 mg/dL (ref 8.9–10.3)
Chloride: 95 mmol/L — ABNORMAL LOW (ref 98–111)
Creatinine, Ser: 1 mg/dL (ref 0.44–1.00)
GFR, Estimated: 57 mL/min — ABNORMAL LOW (ref >60)
Glucose, Bld: 111 mg/dL — ABNORMAL HIGH (ref 70–99)
Potassium: 3.3 mmol/L — ABNORMAL LOW (ref 3.5–5.1)
Sodium: 134 mmol/L — ABNORMAL LOW (ref 135–145)

## 2021-09-18 NOTE — Pre-Procedure Instructions (Signed)
Surgical Instructions   Your procedure is scheduled on Thursday, October 20th. Report to [facility] Main Entrance "A" at 10:30 A.M., then check in with the Admitting office. Call this number if you have problems the morning of surgery: [phone]   If you have any questions prior to your surgery date call [phone]: Open Monday-Friday 8am-4pm   Remember: Do not eat or drink after midnight the night before your surgery  You may drink clear liquids until 09:30 AM the morning of your surgery.   Clear liquids allowed are: Water, Non-Citrus Juices (without pulp), Carbonated Beverages, Clear Tea, Black Coffee Only, and Gatorade   Patient Instructions  The night before surgery:  No food after midnight. ONLY clear liquids after midnight  The day of surgery (if you do NOT have diabetes):  Drink ONE (1) Pre-Surgery Clear Ensure by 09:30 AM the morning of surgery. Drink in one sitting. Do not sip.  This drink was given to you during your hospital  pre-op appointment visit.  Nothing else to drink after completing the  Pre-Surgery Clear Ensure.          If you have questions, please contact your surgeon's office.      Take these medicines the morning of surgery with A SIP OF WATER  allopurinol (ZYLOPRIM) citalopram (CELEXA)  levothyroxine (SYNTHROID) pantoprazole (PROTONIX)- if needed rosuvastatin (CRESTOR) TOPROL XL   As of today, STOP taking any Aspirin (unless otherwise instructed by your surgeon) Aleve, Naproxen, Ibuprofen, Motrin, Advil, Goody's, BC's, all herbal medications, fish oil, and all vitamins.                     Do NOT Smoke (Tobacco/Vaping) or drink Alcohol 24 hours prior to your procedure.  If you use a CPAP at night, you may bring all equipment for your overnight stay.   Contacts, glasses, piercing's, hearing aid's, dentures or partials may not be worn into surgery, please bring cases for these belongings.    For patients admitted to the hospital,  discharge time will be determined by your treatment team.   Patients discharged the day of surgery will not be allowed to drive home, and someone needs to stay with them for 24 hours.  NO VISITORS WILL BE ALLOWED IN PRE-OP WHERE PATIENTS GET READY FOR SURGERY.  ONLY 1 SUPPORT PERSON MAY BE PRESENT IN THE WAITING ROOM WHILE YOU ARE IN SURGERY.  IF YOU ARE TO BE ADMITTED, ONCE YOU ARE IN YOUR ROOM YOU WILL BE ALLOWED TWO (2) VISITORS.  Minor children may have two parents present. Special consideration for safety and communication needs will be reviewed on a case by case basis.   Special instructions:   [place]- Preparing For Surgery  Before surgery, you can play an important role. Because skin is not sterile, your skin needs to be as free of germs as possible. You can reduce the number of germs on your skin by washing with CHG (chlorahexidine gluconate) Soap before surgery.  CHG is an antiseptic cleaner which kills germs and bonds with the skin to continue killing germs even after washing.    Oral Hygiene is also important to reduce your risk of infection.  Remember - BRUSH YOUR TEETH THE MORNING OF SURGERY WITH YOUR REGULAR TOOTHPASTE  Please do not use if you have an allergy to CHG or antibacterial soaps. If your skin becomes reddened/irritated stop using the CHG.  Do not shave (including legs and underarms) for at least 48 hours prior to first  CHG shower. It is OK to shave your face.  Please follow these instructions carefully.   Shower the NIGHT BEFORE SURGERY and the MORNING OF SURGERY  If you chose to wash your hair, wash your hair first as usual with your normal shampoo.  After you shampoo, rinse your hair and body thoroughly to remove the shampoo.  Use CHG Soap as you would any other liquid soap. You can apply CHG directly to the skin and wash gently with a scrungie or a clean washcloth.   Apply the CHG Soap to your body ONLY FROM THE NECK DOWN.  Do not use on open wounds or open  sores. Avoid contact with your eyes, ears, mouth and genitals (private parts). Wash Face and genitals (private parts)  with your normal soap.   Wash thoroughly, paying special attention to the area where your surgery will be performed.  Thoroughly rinse your body with warm water from the neck down.  DO NOT shower/wash with your normal soap after using and rinsing off the CHG Soap.  Pat yourself dry with a CLEAN TOWEL.  Wear CLEAN PAJAMAS to bed the night before surgery  Place CLEAN SHEETS on your bed the night before your surgery  DO NOT SLEEP WITH PETS.   Day of Surgery: Shower with CHG soap. Do not wear jewelry, make up, nail polish, gel polish, artificial nails, or any other type of covering on natural nails including finger and toenails. If patients have artificial nails, gel coating, etc. that need to be removed by a nail salon please have this removed prior to surgery. Surgery may need to be canceled/delayed if the surgeon/ anesthesia feels like the patient is unable to be adequately monitored. Do not wear lotions, powders, perfumes/colognes, or deodorant. Do not shave 48 hours prior to surgery.  Do not bring valuables to the hospital. [facility] is not responsible for any belongings or valuables. Wear Clean/Comfortable clothing the morning of surgery Remember to brush your teeth WITH YOUR REGULAR TOOTHPASTE.   Please read over the following fact sheets that you were given.   3 days prior to your procedure or After your COVID test   You are not required to quarantine however you are required to wear a well-fitting mask when you are out and around people not in your household. If your mask becomes wet or soiled, replace with a new one.   Wash your hands often with soap and water for 20 seconds or clean your hands with an alcohol-based hand sanitizer that contains at least 60% alcohol.   Do not share personal items.   Notify your provider:  o if you are in close  contact with someone who has COVID  o or if you develop a fever of 100.4 or greater, sneezing, cough, sore throat, shortness of breath or body aches.

## 2021-09-18 NOTE — Progress Notes (Signed)
PCP - Dr. Janie Morning Cardiologist - denies  PPM/ICD - denies   Chest x-ray - 06/22/15 EKG - 05/09/21 Stress Test - denies ECHO - 03/24/15 Cardiac Cath - denies  Sleep Study - denies  DM- denies  Blood Thinner Instructions: pt says she stopped taking Pradaxa 2 weeks ago Aspirin Instructions: n/a  ERAS Protcol -yes PRE-SURGERY Ensure given  COVID TEST- n/a, ambulatory surgery   Anesthesia review: yes, awaiting records from Dr. Theda Sers   Patient denies shortness of breath, fever, cough and chest pain at PAT appointment   All instructions explained to the patient, with a verbal understanding of the material. Patient agrees to go over the instructions while at home for a better understanding. Patient also instructed to wear a mask in public until day of surgery. The opportunity to ask questions was provided.

## 2021-09-18 NOTE — Progress Notes (Signed)
This pt needs someone with her during pre-op on day of surgery. She is quite confused. Repeated answers to the same questions asked multiple times. Pt's landscaper came with her to PAT appt. He stated that he didn't know if anyone would be able to stay with her for 24 hours and asked if she could be admitted. I told him that would be something to talk to the surgeon about and advised him to call the surgeon's office.

## 2021-09-19 NOTE — Progress Notes (Signed)
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  Last seen by Dr. Debara Pickett 05/09/2021 and was doing well at that time from cardiac standpoint.  No changes made to management.  Dr. Debara Pickett cleared patient to hold Pradaxa per telephone encounter 07/30/2021, "Ok to hold Pradaxa for 3 days prior to procedure. Restart after. No testing necessary for risk stratification."  Patient reported she been holding her Pradaxa for 2 weeks, since prior surgery date.  Sleep study 04/12/2018 showed OSA with prolonged and severe hypoxemia responded to sleep positional changes.  She was recommended CPAP therapy.  At PAT appointment, patient did not recall having sleep study and said that she did not use CPAP.  Per PCP notes, patient is being evaluated for dementia and she was noted to have difficulty answering questions at preadmission testing appointments.  Needs someone with her during appointments to supplement history and help answer questions.  Surgery previously postponed due to hyponatremia on preop labs.  Repeat labs 09/18/2021 show sodium 134, up from 124 on 08/21/2021.  Remainder of preop labs unremarkable.  EKG 05/09/21: Afib. Rate 69. ST and T wave abnormality, consider inferior ischemia.  TTE 03/24/2015: - Left ventricle: The cavity size was normal. Wall thickness was    normal. Systolic function was normal. The estimated ejection    fraction was in the range of 55% to 60%.  - Aortic valve: Calcified non coronary cusp.  - Mitral valve: Calcified annulus. Mildly thickened leaflets .  - Left atrium: The atrium was mildly dilated.  - Right ventricle: The cavity size was mildly dilated.  - Right atrium: The atrium was mildly dilated.  - Atrial septum: No defect or patent foramen ovale was identified.   Nuclear stress 09/14/2014: Impression Exercise Capacity:  Lexiscan with no exercise. BP Response:  Normal blood pressure response. Clinical Symptoms:  No  significant symptoms noted. ECG Impression:  No significant ST segment change suggestive of ischemia. Comparison with Prior Nuclear Study: No previous nuclear study performed   Overall Impression:  Normal stress nuclear study.   LV Wall Motion:  Not performed secondary to [place], PA-C [facility] Short Stay Center/Anesthesiology Phone [phone] 09/19/2021 3:58 PM

## 2021-09-19 NOTE — Anesthesia Preprocedure Evaluation (Addendum)
Anesthesia Evaluation  Patient identified by MRN, date of birth, ID band Patient awake    Reviewed: Allergy & Precautions, NPO status , Patient's Chart, lab work & pertinent test results  Airway Mallampati: II  TM Distance: >3 FB     Dental   Pulmonary shortness of breath, COPD, former smoker,    breath sounds clear to auscultation       Cardiovascular hypertension,  Rhythm:Regular Rate:Normal     Neuro/Psych Depression    GI/Hepatic hiatal hernia, GERD  ,  Endo/Other  Hypothyroidism   Renal/GU Renal disease     Musculoskeletal   Abdominal   Peds  Hematology   Anesthesia Other Findings   Reproductive/Obstetrics                            Anesthesia Physical Anesthesia Plan  ASA: 3  Anesthesia Plan: General   Post-op Pain Management:    Induction: Intravenous  PONV Risk Score and Plan: 3 and Ondansetron, Dexamethasone and Midazolam  Airway Management Planned: LMA  Additional Equipment:   Intra-op Plan:   Post-operative Plan: Extubation in OR  Informed Consent: I have reviewed the patients History and Physical, chart, labs and discussed the procedure including the risks, benefits and alternatives for the proposed anesthesia with the patient or authorized representative who has indicated his/her understanding and acceptance.     Dental advisory given  Plan Discussed with: CRNA, Anesthesiologist and Surgeon  Anesthesia Plan Comments: (PAT note by Karoline Caldwell, PA-C: Follows with cardiology for history of permanent A. fib maintained on Pradaxa.  Last seen by Dr. Debara Pickett 05/09/2021 and was doing well at that time from cardiac standpoint.  No changes made to management.  Dr. Debara Pickett cleared patient to hold Pradaxa per telephone encounter 07/30/2021, "Ok to hold Pradaxa for 3 days prior to procedure. Restart after. No testing necessary for risk stratification."  Patient reported she been  holding her Pradaxa for 2 weeks, since prior surgery date.  Sleep study 04/12/2018 showed OSA with prolonged and severe hypoxemia responded to sleep positional changes.  She was recommended CPAP therapy.  At PAT appointment, patient did not recall having sleep study and said that she did not use CPAP.  Per PCP notes, patient is being evaluated for dementia and she was noted to have difficulty answering questions at preadmission testing appointments.  Needs someone with her during appointments to supplement history and help answer questions.  Surgery previously postponed due to hyponatremia on preop labs.  Repeat labs 09/18/2021 show sodium 134, up from 124 on 08/21/2021.  Remainder of preop labs unremarkable.  EKG 05/09/21: Afib. Rate 69. ST and T wave abnormality, consider inferior ischemia.  TTE 03/24/2015: - Left ventricle: The cavity size was normal. Wall thickness was  normal. Systolic function was normal. The estimated ejection  fraction was in the range of 55% to 60%.  - Aortic valve: Calcified non coronary cusp.  - Mitral valve: Calcified annulus. Mildly thickened leaflets .  - Left atrium: The atrium was mildly dilated.  - Right ventricle: The cavity size was mildly dilated.  - Right atrium: The atrium was mildly dilated.  - Atrial septum: No defect or patent foramen ovale was identified.   Nuclear stress 09/14/2014: Impression Exercise Capacity: Lexiscan with no exercise. BP Response: Normal blood pressure response. Clinical Symptoms: No significant symptoms noted. ECG Impression: No significant ST segment change suggestive of ischemia. Comparison with Prior Nuclear Study: No previous nuclear study performed  Overall Impression: Normal stress nuclear study.  LV Wall Motion: Not performed secondary to AFIB  )       Anesthesia Quick Evaluation

## 2021-09-27 ENCOUNTER — Other Ambulatory Visit: Payer: Self-pay

## 2021-09-27 ENCOUNTER — Encounter (HOSPITAL_COMMUNITY)
Admission: RE | Disposition: A | Discharge status: Home / Self Care | Payer: Self-pay | Source: Home / Self Care | Attending: General Surgery

## 2021-09-27 ENCOUNTER — Ambulatory Visit (HOSPITAL_COMMUNITY): Payer: Medicare Other | Admitting: Registered Nurse

## 2021-09-27 ENCOUNTER — Encounter (HOSPITAL_COMMUNITY): Payer: Self-pay | Admitting: General Surgery

## 2021-09-27 ENCOUNTER — Ambulatory Visit (HOSPITAL_COMMUNITY)
Admission: RE | Admit: 2021-09-27 | Discharge: 2021-09-27 | Disposition: A | Discharge status: Home / Self Care | Payer: Medicare Other | Attending: General Surgery | Admitting: General Surgery

## 2021-09-27 ENCOUNTER — Ambulatory Visit (HOSPITAL_COMMUNITY): Payer: Medicare Other | Admitting: Physician Assistant

## 2021-09-27 DIAGNOSIS — F32A Depression, unspecified: Secondary | ICD-10-CM | POA: Diagnosis not present

## 2021-09-27 DIAGNOSIS — E039 Hypothyroidism, unspecified: Secondary | ICD-10-CM | POA: Diagnosis not present

## 2021-09-27 DIAGNOSIS — C44529 Squamous cell carcinoma of skin of other part of trunk: Secondary | ICD-10-CM | POA: Diagnosis not present

## 2021-09-27 DIAGNOSIS — Z79890 Hormone replacement therapy (postmenopausal): Secondary | ICD-10-CM | POA: Diagnosis not present

## 2021-09-27 DIAGNOSIS — Z7901 Long term (current) use of anticoagulants: Secondary | ICD-10-CM | POA: Insufficient documentation

## 2021-09-27 DIAGNOSIS — Z87891 Personal history of nicotine dependence: Secondary | ICD-10-CM | POA: Insufficient documentation

## 2021-09-27 DIAGNOSIS — K449 Diaphragmatic hernia without obstruction or gangrene: Secondary | ICD-10-CM | POA: Diagnosis not present

## 2021-09-27 DIAGNOSIS — C493 Malignant neoplasm of connective and soft tissue of thorax: Secondary | ICD-10-CM | POA: Diagnosis not present

## 2021-09-27 DIAGNOSIS — Z79899 Other long term (current) drug therapy: Secondary | ICD-10-CM | POA: Insufficient documentation

## 2021-09-27 DIAGNOSIS — Z888 Allergy status to other drugs, medicaments and biological substances status: Secondary | ICD-10-CM | POA: Insufficient documentation

## 2021-09-27 HISTORY — PX: LESION EXCISION WITH COMPLEX REPAIR: SHX6700

## 2021-09-27 SURGERY — LESION EXCISION WITH COMPLEX REPAIR
Anesthesia: General | Site: Chest

## 2021-09-27 MED ORDER — ONDANSETRON HCL 4 MG/2ML IJ SOLN
INTRAMUSCULAR | Status: DC | PRN
  Administered 2021-09-27: 4 mg via INTRAVENOUS

## 2021-09-27 MED ORDER — LIDOCAINE HCL 1 % IJ SOLN
INTRAMUSCULAR | Status: AC
  Filled 2021-09-27: qty 20

## 2021-09-27 MED ORDER — CHLORHEXIDINE GLUCONATE CLOTH 2 % EX PADS: 6.0000 | MEDICATED_PAD | Freq: Once | CUTANEOUS | Status: DC

## 2021-09-27 MED ORDER — DEXAMETHASONE SODIUM PHOSPHATE 10 MG/ML IJ SOLN
INTRAMUSCULAR | Status: DC | PRN
  Administered 2021-09-27: 10 mg via INTRAVENOUS

## 2021-09-27 MED ORDER — PROPOFOL 10 MG/ML IV BOLUS
INTRAVENOUS | Status: DC | PRN
Start: 2021-09-27 — End: 2021-09-27
  Administered 2021-09-27: 120 mg via INTRAVENOUS

## 2021-09-27 MED ORDER — PHENYLEPHRINE HCL-NACL 20-0.9 MG/250ML-% IV SOLN
INTRAVENOUS | Status: DC | PRN
  Administered 2021-09-27: 25 ug/min via INTRAVENOUS

## 2021-09-27 MED ORDER — LIDOCAINE HCL 1 % IJ SOLN
INTRAMUSCULAR | Status: DC | PRN
  Administered 2021-09-27: 18 mL

## 2021-09-27 MED ORDER — ENSURE PRE-SURGERY PO LIQD: 296.0000 mL | Freq: Once | ORAL | Status: DC

## 2021-09-27 MED ORDER — CEFAZOLIN SODIUM-DEXTROSE 2-4 GM/100ML-% IV SOLN
2.0000 g | INTRAVENOUS | Status: AC
  Administered 2021-09-27: 2 g via INTRAVENOUS

## 2021-09-27 MED ORDER — PROPOFOL 10 MG/ML IV BOLUS
INTRAVENOUS | Status: AC
  Filled 2021-09-27: qty 20

## 2021-09-27 MED ORDER — BUPIVACAINE-EPINEPHRINE (PF) 0.25% -1:200000 IJ SOLN
INTRAMUSCULAR | Status: AC
  Filled 2021-09-27: qty 30

## 2021-09-27 MED ORDER — CEFAZOLIN SODIUM-DEXTROSE 2-4 GM/100ML-% IV SOLN
INTRAVENOUS | Status: AC
  Filled 2021-09-27: qty 100

## 2021-09-27 MED ORDER — CHLORHEXIDINE GLUCONATE 0.12 % MT SOLN
OROMUCOSAL | Status: AC
  Administered 2021-09-27: 15 mL via OROMUCOSAL
  Filled 2021-09-27: qty 15

## 2021-09-27 MED ORDER — LIDOCAINE 2% (20 MG/ML) 5 ML SYRINGE
INTRAMUSCULAR | Status: DC | PRN
  Administered 2021-09-27: 100 mg via INTRAVENOUS

## 2021-09-27 MED ORDER — CHLORHEXIDINE GLUCONATE 0.12 % MT SOLN: 15.0000 mL | Freq: Once | OROMUCOSAL | Status: AC

## 2021-09-27 MED ORDER — PHENYLEPHRINE HCL (PRESSORS) 10 MG/ML IV SOLN
INTRAVENOUS | Status: DC | PRN
  Administered 2021-09-27 (×3): 80 ug via INTRAVENOUS

## 2021-09-27 MED ORDER — FENTANYL CITRATE (PF) 100 MCG/2ML IJ SOLN: 25.0000 ug | INTRAMUSCULAR | Status: DC | PRN

## 2021-09-27 MED ORDER — ACETAMINOPHEN 500 MG PO TABS: 1000.0000 mg | ORAL_TABLET | ORAL | Status: AC

## 2021-09-27 MED ORDER — 0.9 % SODIUM CHLORIDE (POUR BTL) OPTIME
TOPICAL | Status: DC | PRN
  Administered 2021-09-27: 1000 mL

## 2021-09-27 MED ORDER — LACTATED RINGERS IV SOLN
INTRAVENOUS | Status: DC
Start: 2021-09-27 — End: 2021-09-27

## 2021-09-27 MED ORDER — OXYCODONE HCL 5 MG PO TABS: 5.0000 mg | ORAL_TABLET | Freq: Four times a day (QID) | ORAL | 0 refills | Status: AC | PRN

## 2021-09-27 MED ORDER — FENTANYL CITRATE (PF) 250 MCG/5ML IJ SOLN
INTRAMUSCULAR | Status: DC | PRN
  Administered 2021-09-27 (×6): 25 ug via INTRAVENOUS

## 2021-09-27 MED ORDER — FENTANYL CITRATE (PF) 250 MCG/5ML IJ SOLN
INTRAMUSCULAR | Status: AC
  Filled 2021-09-27: qty 5

## 2021-09-27 MED ORDER — ACETAMINOPHEN 500 MG PO TABS
ORAL_TABLET | ORAL | Status: AC
  Administered 2021-09-27: 1000 mg via ORAL
  Filled 2021-09-27: qty 2

## 2021-09-27 MED ORDER — LIDOCAINE 2% (20 MG/ML) 5 ML SYRINGE
INTRAMUSCULAR | Status: AC
  Filled 2021-09-27: qty 5

## 2021-09-27 MED ORDER — ORAL CARE MOUTH RINSE: 15.0000 mL | Freq: Once | OROMUCOSAL | Status: AC

## 2021-09-27 SURGICAL SUPPLY — 46 items
ADH SKN CLS APL DERMABOND .7 (GAUZE/BANDAGES/DRESSINGS)
APL PRP STRL LF DISP 70% ISPRP (MISCELLANEOUS) ×1
APL SKNCLS STERI-STRIP NONHPOA (GAUZE/BANDAGES/DRESSINGS) ×1
BAG COUNTER SPONGE SURGICOUNT (BAG) ×2 IMPLANT
BAG SPNG CNTER NS LX DISP (BAG) ×1
BENZOIN TINCTURE PRP APPL 2/3 (GAUZE/BANDAGES/DRESSINGS) ×2 IMPLANT
CANISTER SUCT 3000ML PPV (MISCELLANEOUS) ×2 IMPLANT
CHLORAPREP W/TINT 26 (MISCELLANEOUS) ×2 IMPLANT
COVER SURGICAL LIGHT HANDLE (MISCELLANEOUS) ×2 IMPLANT
DERMABOND ADVANCED (GAUZE/BANDAGES/DRESSINGS)
DERMABOND ADVANCED .7 DNX12 (GAUZE/BANDAGES/DRESSINGS) IMPLANT
DRAPE LAPAROTOMY 100X72 PEDS (DRAPES) IMPLANT
DRSG TEGADERM 4X10 (GAUZE/BANDAGES/DRESSINGS) ×1 IMPLANT
DRSG TEGADERM 4X4.75 (GAUZE/BANDAGES/DRESSINGS) IMPLANT
ELECT REM PT RETURN 9FT ADLT (ELECTROSURGICAL) ×2
ELECTRODE REM PT RTRN 9FT ADLT (ELECTROSURGICAL) ×1 IMPLANT
GAUZE 4X4 16PLY ~~LOC~~+RFID DBL (SPONGE) ×2 IMPLANT
GAUZE SPONGE 4X4 12PLY STRL (GAUZE/BANDAGES/DRESSINGS) ×1 IMPLANT
GLOVE SURG ENC MOIS LTX SZ6 (GLOVE) ×2 IMPLANT
GLOVE SURG UNDER LTX SZ6.5 (GLOVE) ×2 IMPLANT
GOWN STRL REUS W/ TWL LRG LVL3 (GOWN DISPOSABLE) ×1 IMPLANT
GOWN STRL REUS W/TWL 2XL LVL3 (GOWN DISPOSABLE) ×2 IMPLANT
GOWN STRL REUS W/TWL LRG LVL3 (GOWN DISPOSABLE) ×2
KIT BASIN OR (CUSTOM PROCEDURE TRAY) ×2 IMPLANT
KIT TURNOVER KIT B (KITS) ×2 IMPLANT
MARKER SKIN DUAL TIP RULER LAB (MISCELLANEOUS) ×2 IMPLANT
NDL HYPO 25GX1X1/2 BEV (NEEDLE) ×2 IMPLANT
NEEDLE HYPO 25GX1X1/2 BEV (NEEDLE) ×4 IMPLANT
NS IRRIG 1000ML POUR BTL (IV SOLUTION) ×2 IMPLANT
PACK GENERAL/GYN (CUSTOM PROCEDURE TRAY) ×2 IMPLANT
PAD ARMBOARD 7.5X6 YLW CONV (MISCELLANEOUS) ×4 IMPLANT
PENCIL SMOKE EVACUATOR (MISCELLANEOUS) ×2 IMPLANT
POWDER MYRIAD MORCELLS 1000MG (Miscellaneous) ×1 IMPLANT
SPECIMEN JAR SMALL (MISCELLANEOUS) ×2 IMPLANT
STRIP CLOSURE SKIN 1/2X4 (GAUZE/BANDAGES/DRESSINGS) ×1 IMPLANT
SUT ETHILON 2 0 FS 18 (SUTURE) ×2 IMPLANT
SUT ETHILON 2 0 PSLX (SUTURE) ×1 IMPLANT
SUT MNCRL AB 4-0 PS2 18 (SUTURE) ×3 IMPLANT
SUT SILK 2 0 PERMA HAND 18 BK (SUTURE) IMPLANT
SUT VIC AB 2-0 SH 27 (SUTURE) ×2
SUT VIC AB 2-0 SH 27XBRD (SUTURE) ×1 IMPLANT
SUT VIC AB 3-0 SH 27 (SUTURE) ×2
SUT VIC AB 3-0 SH 27X BRD (SUTURE) ×1 IMPLANT
SUT VIC AB 3-0 SH 8-18 (SUTURE) ×1 IMPLANT
SYR CONTROL 10ML LL (SYRINGE) ×4 IMPLANT
TOWEL GREEN STERILE FF (TOWEL DISPOSABLE) ×2 IMPLANT

## 2021-09-27 NOTE — Anesthesia Procedure Notes (Signed)
Procedure Name: LMA Insertion Date/Time: 09/27/2021 1:33 PM Performed by: Jearld Pies, CRNA Pre-anesthesia Checklist: Patient identified, Emergency Drugs available, Suction available and Patient being monitored Patient Re-evaluated:Patient Re-evaluated prior to induction Oxygen Delivery Method: Circle System Utilized Preoxygenation: Pre-oxygenation with 100% oxygen Induction Type: IV induction Ventilation: Mask ventilation without difficulty LMA: LMA inserted LMA Size: 4.0 Number of attempts: 1 Airway Equipment and Method: Bite block Placement Confirmation: positive ETCO2 Tube secured with: Tape Dental Injury: Teeth and Oropharynx as per pre-operative assessment

## 2021-09-27 NOTE — H&P (Addendum)
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 female who was referred by Dr.Govinda Aryal for an abnormal skin lesion on central chest. The patient stateed it had been present for several months and had not changed much since then. She denied drainage from the area. Her PCP placed her on antibiotics, for a possible infected sebaceuos cyst. She denies any swollen nodes in her axillas. Denies prior history of skin cancer. She is otherwise healthy and does not take blood thinners.   She saw Puja in our office 06/15/21 and got a punch biopsy of the lesion. 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   Review of Systems: A complete review of systems was obtained from the patient. I have reviewed this information and discussed as appropriate with the patient. See HPI as well for other ROS.  Review of Systems  Respiratory: Positive for shortness of breath.  Neurological: Positive for dizziness and weakness.    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 Tonita Cong    Allergies  Allergen Reactions   Antihistamines - Alkylamine Other (See Comments)  INTENSE SHAKING AND UTI   Diphenhydramine Hcl Other (See Comments)  INTENSE SHAKING AND UTI   Loratadine Other (See Comments)  INTENSE SHAKING AND UTI   Current Outpatient Medications on File Prior to Visit  Medication Sig Dispense Refill   allopurinoL (ZYLOPRIM) 100 MG tablet   cholecalciferol (VITAMIN D3) 1000 unit capsule cholecalciferol (vitamin D3) 25 mcg (1,000 unit) capsule 1000 units by oral route.   citalopram (CELEXA) 20 MG tablet   dexlansoprazole (DEXILANT) 60 mg DR capsule Dexilant 60 mg  capsule, delayed release 60 mg by oral route.   FUROsemide (LASIX) 40 MG tablet   levothyroxine (SYNTHROID) 125 MCG tablet   losartan-hydrochlorothiazide (HYZAAR) 50-12.5 mg tablet   PRADAXA 150 mg capsule   rosuvastatin (CRESTOR) 10 MG tablet   No current facility-administered medications on file prior to visit.   Family History  Problem Relation Age of Onset   Myocardial Infarction (Heart attack) Father    Social History   Tobacco Use  Smoking Status Never Smoker  Smokeless Tobacco Never Used    Social History   Socioeconomic History   Marital status: Widowed  Tobacco Use   Smoking status: Never Smoker   Smokeless tobacco: Never Used  Substance and Sexual Activity   Alcohol use: Yes  Comment: twice per year   Drug use: Never   Objective:   Pulse 66  Temp 36.4 C (97.5 F)  Ht 167.6 cm (5\' 6" )  Wt 95.7 kg (211 lb)  SpO2 96%  BMI 34.06 kg/m  Body mass index is 34.06 kg/m.  General: Well-developed, well-nourished, in no acute distress. Wearing mask Eyes: No scleral icterus. Pupils equal, lids normal Respiratory: Normal effort, no use of accessory muscles Musculoskeletal: Normal gait. Grossly normal ROM upper and lower extremities  Psychiatric: Normal judgement and insight. Normal mood and affect. Alert, oriented x 3 Hard of hearing.  Lymphatic: no LAD cervical, axillary, supraclavicular basins.   Integumentary: There is a 2 cm x 2 cm rubbery, erythematous, raised skin lesion with a small opening on the inferior aspect, no active  drainage. Minimal change since Puja saw it.   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  Discussed risks of surgery including bleeding, infection, wound breakdown, heart or lung issues, chronic pain, recurrence of cancer, blood clot, death.   I will have to contact Dr. Debara Pickett about holding pradaxa  and whether or not we need any cardiac testing for risk stratification.

## 2021-09-27 NOTE — Transfer of Care (Signed)
Immediate Anesthesia Transfer of Care Note  Patient: Amber Wu  Procedure(s) Performed: EXCISION OF CHEST WALL MALIGNANT LESION ADVANCEMENT FLAP CLOSURE (Chest)  Patient Location: PACU  Anesthesia Type:General  Level of Consciousness: awake, alert  and oriented  Airway & Oxygen Therapy: Patient Spontanous Breathing and Patient connected to nasal cannula oxygen  Post-op Assessment: Report given to RN and Post -op Vital signs reviewed and stable  Post vital signs: Reviewed and stable  Last Vitals:  Vitals Value Taken Time  BP 144/57 09/27/21 1436  Temp    Pulse 86 09/27/21 1439  Resp 15 09/27/21 1439  SpO2 100 % 09/27/21 1439  Vitals shown include unvalidated device data.  Last Pain:  Vitals:   09/27/21 1029  TempSrc:   PainSc: 5       Patients Stated Pain Goal: 3 (58/34/62 1947)  Complications: No notable events documented.

## 2021-09-27 NOTE — Interval H&P Note (Signed)
History and Physical Interval Note:  09/27/2021 12:56 PM  Amber Wu  has presented today for surgery, with the diagnosis of squamous carcinoma chest wall.  The various methods of treatment have been discussed with the patient and family. After consideration of risks, benefits and other options for treatment, the patient has consented to  Procedure(s): EXCISION OF CHEST WALL MALIGNANT LESION ADVANCEMENT FLAP CLOSURE (N/A) as a surgical intervention.  The patient's history has been reviewed, patient examined, no change in status, stable for surgery.  I have reviewed the patient's chart and labs.  Questions were answered to the patient's satisfaction.     Stark Klein

## 2021-09-27 NOTE — Discharge Instructions (Addendum)
[facility] Office Phone Number [phone]   POST OP INSTRUCTIONS  Always review your discharge instruction sheet given to you by the facility where your surgery was performed.  IF YOU HAVE DISABILITY OR FAMILY LEAVE FORMS, YOU MUST BRING THEM TO THE OFFICE FOR PROCESSING.  DO NOT GIVE THEM TO YOUR DOCTOR.  A prescription for pain medication may be given to you upon discharge.  Take your pain medication as prescribed, if needed.  If narcotic pain medicine is not needed, then you may take acetaminophen (Tylenol) or ibuprofen (Advil) as needed. Take your usually prescribed medications unless otherwise directed If you need a refill on your pain medication, please contact your pharmacy.  They will contact our office to request authorization.  Prescriptions will not be filled after 5pm or on week-ends. You should eat very light the first 24 hours after surgery, such as soup, crackers, pudding, etc.  Resume your normal diet the day after surgery It is common to experience some constipation if taking pain medication after surgery.  Increasing fluid intake and taking a stool softener will usually help or prevent this problem from occurring.  A mild laxative (Milk of Magnesia or Miralax) should be taken according to package directions if there are no bowel movements after 48 hours. You may shower in 48 hours.  The surgical glue will flake off in 2-3 weeks.   ACTIVITIES:  No strenuous activity or heavy lifting for 1 week.   You may drive when you no longer are taking prescription pain medication, you can comfortably wear a seatbelt, and you can safely maneuver your car and apply brakes. RETURN TO WORK:  __________n/a_______________ You should see your doctor in the office for a follow-up appointment approximately three-four weeks after your surgery.    WHEN TO CALL YOUR DOCTOR: Fever over 101.0 Nausea and/or vomiting. Extreme swelling or bruising. Continued bleeding from  incision. Increased pain, redness, or drainage from the incision.  The clinic staff is available to answer your questions during regular business hours.  Please don't hesitate to call and ask to speak to one of the nurses for clinical concerns.  If you have a medical emergency, go to the nearest emergency room or call 911.  A surgeon from [facility] is always on call at the hospital.  For further questions, please visit [email or web address]   

## 2021-09-27 NOTE — Anesthesia Postprocedure Evaluation (Signed)
Anesthesia Post Note  Patient: Amber Wu  Procedure(s) Performed: EXCISION OF CHEST WALL MALIGNANT LESION ADVANCEMENT FLAP CLOSURE (Chest)     Patient location during evaluation: PACU Anesthesia Type: General Level of consciousness: awake Pain management: pain level controlled Vital Signs Assessment: post-procedure vital signs reviewed and stable Respiratory status: spontaneous breathing Cardiovascular status: stable Postop Assessment: no apparent nausea or vomiting Anesthetic complications: no   No notable events documented.  Last Vitals:  Vitals:   09/27/21 1456 09/27/21 1505  BP:    Pulse: (!) 50 69  Resp: (!) 35 (!) 24  Temp:  (!) 36.3 C  SpO2: 99% 99%    Last Pain:  Vitals:   09/27/21 1505  TempSrc:   PainSc: 0-No pain                 Nickalus Thornsberry

## 2021-09-27 NOTE — Op Note (Addendum)
PRE-OPERATIVE DIAGNOSIS: squamous cell carcinoma chest wall  POST-OPERATIVE DIAGNOSIS:  Same  PROCEDURE:  Procedure(s): Wide local excision malignant chest wall lesion (5x3 cm), advancement flap closure for defect 10.7x4.1 cm  SURGEON:  Surgeon(s): Stark Klein, MD  ASSIST:  Pryor Curia, RNFA  ANESTHESIA:   local and general  DRAINS: none   LOCAL MEDICATIONS USED:  MARCAINE    and XYLOCAINE   SPECIMEN:  Source of Specimen: chest wall malignant lesion  FINDINGS:  firm skin mass  DISPOSITION OF SPECIMEN:  PATHOLOGY  COUNTS:  YES  PLAN OF CARE: Discharge to home after PACU  PATIENT DISPOSITION:  PACU - hemodynamically stable.    PROCEDURE:   Pt was identified in the holding area, taken to the OR, and placed supine on the OR table.  General anesthesia was induced.  Time out was performed according to the surgical safety checklist.  When all was correct, we continued.     The patient was placed into the supine position with arms tucked.  The upper central chest wall was prepped and draped in sterile fashion.  The lesion was identified.  Local was administered under the melanoma and the adjacent tissue.  A #10 blade was used to incise the skin around the lesion  The cautery was used to take the dissection down to the fascia.  The skin was marked in situ with orientation sutures.  The cautery was used to take the specimen off the fascia, and it was passed off the table.    Skin hooks were used to elevate the edges of the incision and the skin was freed up in all directions with the cautery to form advancement flaps.  This was pulled together in an curvilinear transverse orientation. The skin was pulled together to check the tension.  Myriad Morcells were placed into the defect to help with healing. The skin was then reapproximated with 3-0 interrupted vicryl deep dermal sutures and 4-0 monocryl running subcuticular sutures.  Three 2-0 nylon horizontal mattress sutures were placed as  well.    The wound was cleaned, dried, and dressed with Benzoin, steristrips, gauze, and tegaderm.    Needle, sponge, and instrument counts were correct.  The patient was awakened from anesthesia and taken to the PACU in stable condition.

## 2021-09-28 ENCOUNTER — Encounter (HOSPITAL_COMMUNITY): Payer: Self-pay | Admitting: General Surgery

## 2021-10-01 LAB — SURGICAL PATHOLOGY

## 2021-10-09 ENCOUNTER — Encounter (HOSPITAL_COMMUNITY): Payer: Self-pay | Admitting: General Surgery

## 2022-02-15 DIAGNOSIS — K219 Gastro-esophageal reflux disease without esophagitis: Secondary | ICD-10-CM | POA: Diagnosis not present

## 2022-02-15 DIAGNOSIS — R7309 Other abnormal glucose: Secondary | ICD-10-CM | POA: Diagnosis not present

## 2022-02-15 DIAGNOSIS — E7800 Pure hypercholesterolemia, unspecified: Secondary | ICD-10-CM | POA: Diagnosis not present

## 2022-02-15 DIAGNOSIS — E039 Hypothyroidism, unspecified: Secondary | ICD-10-CM | POA: Diagnosis not present

## 2022-02-15 DIAGNOSIS — I10 Essential (primary) hypertension: Secondary | ICD-10-CM | POA: Diagnosis not present

## 2022-04-08 DEATH — deceased
